# Patient Record
Sex: Male | Born: 1940 | Race: White | Hispanic: No | Smoking: Never smoker
Health system: Southern US, Community
[De-identification: ages and names within clinical notes are randomized; demographics above are authoritative.]

## PROBLEM LIST (undated history)

## (undated) DIAGNOSIS — R0602 Shortness of breath: Secondary | ICD-10-CM

## (undated) DIAGNOSIS — Z89119 Acquired absence of unspecified hand: Secondary | ICD-10-CM

## (undated) DIAGNOSIS — I10 Essential (primary) hypertension: Secondary | ICD-10-CM

## (undated) DIAGNOSIS — I6509 Occlusion and stenosis of unspecified vertebral artery: Secondary | ICD-10-CM

## (undated) DIAGNOSIS — I6523 Occlusion and stenosis of bilateral carotid arteries: Secondary | ICD-10-CM

## (undated) DIAGNOSIS — T7500XA Unspecified effects of lightning, initial encounter: Secondary | ICD-10-CM

## (undated) DIAGNOSIS — I350 Nonrheumatic aortic (valve) stenosis: Secondary | ICD-10-CM

## (undated) DIAGNOSIS — Z8709 Personal history of other diseases of the respiratory system: Secondary | ICD-10-CM

## (undated) DIAGNOSIS — M1990 Unspecified osteoarthritis, unspecified site: Secondary | ICD-10-CM

## (undated) DIAGNOSIS — Z87898 Personal history of other specified conditions: Secondary | ICD-10-CM

## (undated) DIAGNOSIS — I7121 Aneurysm of the ascending aorta, without rupture: Secondary | ICD-10-CM

## (undated) HISTORY — DX: Unspecified effects of lightning, initial encounter: T75.00XA

## (undated) HISTORY — DX: Personal history of other specified conditions: Z87.898

## (undated) HISTORY — DX: Occlusion and stenosis of unspecified vertebral artery: I65.09

## (undated) HISTORY — DX: Essential (primary) hypertension: I10

## (undated) HISTORY — DX: Nonrheumatic aortic (valve) stenosis: I35.0

## (undated) HISTORY — DX: Personal history of other diseases of the respiratory system: Z87.09

## (undated) HISTORY — DX: Aneurysm of the ascending aorta, without rupture: I71.21

## (undated) HISTORY — DX: Occlusion and stenosis of bilateral carotid arteries: I65.23

## (undated) HISTORY — DX: Shortness of breath: R06.02

---
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
## 1968-10-28 DIAGNOSIS — Z89119 Acquired absence of unspecified hand: Secondary | ICD-10-CM

## 1968-10-28 HISTORY — DX: Acquired absence of unspecified hand: Z89.119

## 1968-10-28 HISTORY — PX: FINGER SURGERY: SHX640

## 1979-02-26 DIAGNOSIS — T7500XA Unspecified effects of lightning, initial encounter: Secondary | ICD-10-CM

## 1979-02-26 HISTORY — DX: Unspecified effects of lightning, initial encounter: T75.00XA

## 2005-01-17 ENCOUNTER — Emergency Department (HOSPITAL_COMMUNITY): Admission: EM | Admit: 2005-01-17 | Discharge: 2005-01-17 | Payer: Self-pay | Admitting: Emergency Medicine

## 2005-01-18 ENCOUNTER — Emergency Department (HOSPITAL_COMMUNITY): Admission: EM | Admit: 2005-01-18 | Discharge: 2005-01-18 | Payer: Self-pay | Admitting: Emergency Medicine

## 2012-01-01 ENCOUNTER — Encounter: Payer: Self-pay | Admitting: *Deleted

## 2012-03-04 ENCOUNTER — Encounter: Payer: Self-pay | Admitting: Internal Medicine

## 2012-03-04 ENCOUNTER — Other Ambulatory Visit: Payer: Self-pay | Admitting: Internal Medicine

## 2012-03-04 ENCOUNTER — Ambulatory Visit (INDEPENDENT_AMBULATORY_CARE_PROVIDER_SITE_OTHER): Payer: Medicare Other | Admitting: Internal Medicine

## 2012-03-04 VITALS — BP 147/104 | HR 78 | Temp 97.9°F | Resp 16 | Ht 67.0 in | Wt 229.4 lb

## 2012-03-04 DIAGNOSIS — I10 Essential (primary) hypertension: Secondary | ICD-10-CM | POA: Insufficient documentation

## 2012-03-04 DIAGNOSIS — G8929 Other chronic pain: Secondary | ICD-10-CM

## 2012-03-04 DIAGNOSIS — M19079 Primary osteoarthritis, unspecified ankle and foot: Secondary | ICD-10-CM | POA: Insufficient documentation

## 2012-03-04 DIAGNOSIS — E1151 Type 2 diabetes mellitus with diabetic peripheral angiopathy without gangrene: Secondary | ICD-10-CM | POA: Insufficient documentation

## 2012-03-04 DIAGNOSIS — E785 Hyperlipidemia, unspecified: Secondary | ICD-10-CM | POA: Insufficient documentation

## 2012-03-04 DIAGNOSIS — Z89111 Acquired absence of right hand: Secondary | ICD-10-CM

## 2012-03-04 DIAGNOSIS — M25519 Pain in unspecified shoulder: Secondary | ICD-10-CM | POA: Insufficient documentation

## 2012-03-04 DIAGNOSIS — Z6835 Body mass index (BMI) 35.0-35.9, adult: Secondary | ICD-10-CM | POA: Insufficient documentation

## 2012-03-04 DIAGNOSIS — Z8719 Personal history of other diseases of the digestive system: Secondary | ICD-10-CM

## 2012-03-04 DIAGNOSIS — E119 Type 2 diabetes mellitus without complications: Secondary | ICD-10-CM | POA: Diagnosis not present

## 2012-03-04 LAB — LIPID PANEL
HDL: 37 mg/dL — ABNORMAL LOW (ref >39)
Triglycerides: 100 mg/dL (ref <150)

## 2012-03-04 LAB — POCT GLYCOSYLATED HEMOGLOBIN (HGB A1C): Hemoglobin A1C: 7.5

## 2012-03-04 LAB — CBC WITH DIFFERENTIAL/PLATELET
HCT: 44.5 % (ref 39.0–52.0)
Hemoglobin: 15 g/dL (ref 13.0–17.0)
Lymphocytes Relative: 32 % (ref 12–46)
MCHC: 33.7 g/dL (ref 30.0–36.0)
Monocytes Absolute: 0.6 10*3/uL (ref 0.1–1.0)
Monocytes Relative: 9 % (ref 3–12)
Neutro Abs: 3.7 10*3/uL (ref 1.7–7.7)
WBC: 6.5 10*3/uL (ref 4.0–10.5)

## 2012-03-04 LAB — COMPREHENSIVE METABOLIC PANEL
Albumin: 4.4 g/dL (ref 3.5–5.2)
BUN: 18 mg/dL (ref 6–23)
CO2: 24 mEq/L (ref 19–32)
Calcium: 9.5 mg/dL (ref 8.4–10.5)
Chloride: 104 mEq/L (ref 96–112)
Glucose, Bld: 103 mg/dL — ABNORMAL HIGH (ref 70–99)
Potassium: 4.2 mEq/L (ref 3.5–5.3)

## 2012-03-04 MED ORDER — GLIPIZIDE ER 10 MG PO TB24: 10.0000 mg | ORAL_TABLET | Freq: Every day | ORAL | Status: DC

## 2012-03-04 MED ORDER — METFORMIN HCL 1000 MG PO TABS: 1000.0000 mg | ORAL_TABLET | Freq: Two times a day (BID) | ORAL | Status: DC

## 2012-03-04 MED ORDER — VERAPAMIL HCL ER 360 MG PO CP24: 360.0000 mg | ORAL_CAPSULE | Freq: Every day | ORAL | Status: DC

## 2012-03-04 MED ORDER — BENAZEPRIL-HYDROCHLOROTHIAZIDE 20-12.5 MG PO TABS: 1.0000 | ORAL_TABLET | Freq: Every day | ORAL | Status: DC

## 2012-03-04 MED ORDER — HYDROCODONE-ACETAMINOPHEN 7.5-750 MG PO TABS: 1.0000 | ORAL_TABLET | Freq: Every day | ORAL | Status: AC

## 2012-03-04 NOTE — Progress Notes (Signed)
Subjective:    Patient ID: Kevin Weaver, male    DOB: 1941/01/01, 71 y.o.   MRN: [id number]  HPI Patient Active Problem List  Diagnoses  . HTN (hypertension)  . DM (diabetes mellitus)  . BMI 35.0-35.9,adult  . Hyperlipemia  . Amputee, hand, right  . Arthritis of foot  . Chronic shoulder pain  . History of GI bleed    Still working And without any increase in shortness of breath or decrease in energy although he claims he sure can't do what he used to do.  Review of Systems  Constitutional: Negative for activity change, appetite change and fatigue.       Has gained at least 10 pounds  Eyes: Negative for visual disturbance.  Respiratory: Negative for chest tightness.        He has no shortness of breath with activity than he would like but no real change in the last few years  Cardiovascular: Negative for chest pain, palpitations and leg swelling.  Gastrointestinal: Negative for abdominal pain and abdominal distention.  Genitourinary: Negative for frequency, hematuria and difficulty urinating.  Musculoskeletal:       He continues to the ankle and shoulder problems which limit his activity from time to time  Neurological: Negative for dizziness, speech difficulty and headaches.  Psychiatric/Behavioral: Negative for suicidal ideas, hallucinations, confusion, sleep disturbance, dysphoric mood and agitation. The patient is not nervous/anxious.        Objective:   Physical Exam  Constitutional: He is oriented to person, place, and time. He appears well-developed and well-nourished.       Overweight BMI 35  HENT:  Head: Normocephalic.  Eyes: Conjunctivae and EOM are normal. Pupils are equal, round, and reactive to light.  Neck: Neck supple. No thyromegaly present.  Cardiovascular: Normal rate, regular rhythm, normal heart sounds and intact distal pulses.  Exam reveals no gallop.   No murmur heard. Pulmonary/Chest: Breath sounds normal.  Neurological: He is alert and oriented  to person, place, and time.  Psychiatric: He has a normal mood and affect. His behavior is normal. Judgment and thought content normal.      Results for orders placed in visit on 03/04/12  POCT GLYCOSYLATED HEMOGLOBIN (HGB A1C)      Component Value Range   Hemoglobin A1C 7.5         Assessment & Plan:   1. DM (diabetes mellitus)  Comprehensive metabolic panel, POCT glycosylated hemoglobin (Hb A1C)  2. HTN (hypertension)  CBC with Differential, Comprehensive metabolic panel  3. BMI 35.0-35.9,adult    4. Hyperlipemia  Lipid panel  5. Amputee, hand, right    6. Arthritis of foot    7. Chronic shoulder pain             .     Recheck in 6 months/He elects to focus on dietary changes and weight loss to try to reduce his hemoglobin A1c to below 7.0           Meds ordered this encounter  Medications  . HYDROcodone-acetaminophen (VICODIN ES) 7.5-750 MG per tablet    Sig: Take 1 tablet by mouth at bedtime.    Dispense:  30 tablet    Refill:  5  . verapamil (VERELAN PM) 360 MG 24 hr capsule    Sig: Take 1 capsule (360 mg total) by mouth at bedtime.    Dispense:  90 capsule    Refill:  1  . metFORMIN (GLUCOPHAGE) 1000 MG tablet    Sig: Take  1 tablet (1,000 mg total) by mouth 2 (two) times daily with a meal.    Dispense:  180 tablet    Refill:  1  . glipiZIDE (GLUCOTROL XL) 10 MG 24 hr tablet    Sig: Take 1 tablet (10 mg total) by mouth daily.    Dispense:  90 tablet    Refill:  1  . benazepril-hydrochlorthiazide (LOTENSIN HCT) 20-12.5 MG per tablet    Sig: Take 1 tablet by mouth daily.    Dispense:  90 tablet    Refill:  1   Notify labs

## 2012-03-19 ENCOUNTER — Encounter: Payer: Self-pay | Admitting: Cardiology

## 2012-05-22 ENCOUNTER — Ambulatory Visit: Payer: Self-pay | Admitting: Internal Medicine

## 2012-05-22 VITALS — BP 138/100 | HR 68 | Temp 98.4°F | Resp 20 | Ht 67.0 in | Wt 232.4 lb

## 2012-05-22 DIAGNOSIS — Z0289 Encounter for other administrative examinations: Secondary | ICD-10-CM

## 2012-05-22 MED ORDER — HYDROCODONE-ACETAMINOPHEN 7.5-500 MG PO TABS: 1.0000 | ORAL_TABLET | Freq: Four times a day (QID) | ORAL | Status: DC | PRN

## 2012-05-22 NOTE — Progress Notes (Signed)
  Subjective:    Patient ID: Kevin Weaver, male    DOB: [birth year], 71 y.o.   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 HTN (hypertension)  . DM (diabetes mellitus)  . BMI 35.0-35.9,adult  . Hyperlipemia  . Amputee, hand, right  . Arthritis of foot  . Chronic shoulder pain  . History of GI bleed  Continues to be very stable Last followup May 2013 and labs were in good shape Home blood pressures are better than office blood pressures  No medicine compliance problemsReview of Systems He has no new symptoms and his review of systems    Objective:   Physical Exam  Constitutional: He is oriented to person, place, and time. He appears well-developed.       Overweight  HENT:       HEENT clear  Eyes: Conjunctivae and EOM are normal. Pupils are equal, round, and reactive to light.  Neck: Normal range of motion. Neck supple. No thyromegaly present.  Cardiovascular: Normal rate, regular rhythm and intact distal pulses.   Murmur heard.      1/6 systolic murmur at the outflow tract without radiation to the carotids Repeat blood pressure in the room sitting right arm 132/85  Pulmonary/Chest: Effort normal and breath sounds normal. He has no wheezes. He has no rales.  Abdominal: Soft. Bowel sounds are normal. He exhibits no distension and no mass. There is no tenderness. There is no guarding.       No abdominal bruit  Musculoskeletal: He exhibits no edema and no tenderness.  Neurological: He is alert and oriented to person, place, and time. He has normal reflexes. He displays normal reflexes. No cranial nerve deficit. He exhibits normal muscle tone. Coordination normal.  Skin: No rash noted.  Psychiatric: He has a normal mood and affect. His behavior is normal. Judgment and thought content normal.       Assessment & Plan:  Impression #1 self-pay DOT exam One-year certification due to hypertension Followup in  November for regular medical problems

## 2012-09-06 ENCOUNTER — Other Ambulatory Visit: Payer: Self-pay | Admitting: Physician Assistant

## 2012-09-09 ENCOUNTER — Ambulatory Visit: Payer: Medicare Other

## 2012-09-09 ENCOUNTER — Encounter: Payer: Self-pay | Admitting: Internal Medicine

## 2012-09-09 ENCOUNTER — Ambulatory Visit (INDEPENDENT_AMBULATORY_CARE_PROVIDER_SITE_OTHER): Payer: Medicare Other | Admitting: Internal Medicine

## 2012-09-09 VITALS — BP 140/108 | HR 63 | Temp 97.8°F | Resp 16 | Ht 67.0 in | Wt 227.8 lb

## 2012-09-09 DIAGNOSIS — Z125 Encounter for screening for malignant neoplasm of prostate: Secondary | ICD-10-CM | POA: Diagnosis not present

## 2012-09-09 DIAGNOSIS — E119 Type 2 diabetes mellitus without complications: Secondary | ICD-10-CM

## 2012-09-09 DIAGNOSIS — Z139 Encounter for screening, unspecified: Secondary | ICD-10-CM

## 2012-09-09 DIAGNOSIS — I10 Essential (primary) hypertension: Secondary | ICD-10-CM | POA: Diagnosis not present

## 2012-09-09 DIAGNOSIS — K219 Gastro-esophageal reflux disease without esophagitis: Secondary | ICD-10-CM | POA: Diagnosis not present

## 2012-09-09 DIAGNOSIS — E7800 Pure hypercholesterolemia, unspecified: Secondary | ICD-10-CM | POA: Diagnosis not present

## 2012-09-09 DIAGNOSIS — I712 Thoracic aortic aneurysm, without rupture: Secondary | ICD-10-CM

## 2012-09-09 DIAGNOSIS — IMO0001 Reserved for inherently not codable concepts without codable children: Secondary | ICD-10-CM

## 2012-09-09 DIAGNOSIS — Z136 Encounter for screening for cardiovascular disorders: Secondary | ICD-10-CM

## 2012-09-09 DIAGNOSIS — G8929 Other chronic pain: Secondary | ICD-10-CM

## 2012-09-09 LAB — COMPREHENSIVE METABOLIC PANEL
AST: 22 U/L (ref 0–37)
BUN: 20 mg/dL (ref 6–23)
Calcium: 9.8 mg/dL (ref 8.4–10.5)
Chloride: 104 mEq/L (ref 96–112)
Creat: 1.03 mg/dL (ref 0.50–1.35)
Total Bilirubin: 0.5 mg/dL (ref 0.3–1.2)

## 2012-09-09 LAB — LIPID PANEL
Cholesterol: 150 mg/dL (ref 0–200)
HDL: 38 mg/dL — ABNORMAL LOW (ref >39)
Triglycerides: 116 mg/dL (ref <150)
VLDL: 23 mg/dL (ref 0–40)

## 2012-09-09 MED ORDER — VERAPAMIL HCL ER 360 MG PO CP24: 360.0000 mg | ORAL_CAPSULE | Freq: Every day | ORAL | Status: DC

## 2012-09-09 MED ORDER — HYDROCODONE-ACETAMINOPHEN 7.5-500 MG PO TABS: 1.0000 | ORAL_TABLET | Freq: Four times a day (QID) | ORAL | Status: DC | PRN

## 2012-09-09 MED ORDER — METFORMIN HCL 1000 MG PO TABS: 1000.0000 mg | ORAL_TABLET | Freq: Two times a day (BID) | ORAL | Status: DC

## 2012-09-09 MED ORDER — GLIPIZIDE ER 10 MG PO TB24: 10.0000 mg | ORAL_TABLET | Freq: Every day | ORAL | Status: DC

## 2012-09-09 MED ORDER — OMEPRAZOLE 40 MG PO CPDR: 40.0000 mg | DELAYED_RELEASE_CAPSULE | Freq: Every day | ORAL | Status: DC

## 2012-09-09 MED ORDER — BENAZEPRIL-HYDROCHLOROTHIAZIDE 20-12.5 MG PO TABS: 2.0000 | ORAL_TABLET | Freq: Every day | ORAL | Status: DC

## 2012-09-09 NOTE — Addendum Note (Signed)
Addended by: Tonye Pearson on: 09/09/2012 11:43 PM   Modules accepted: Orders, Level of Service

## 2012-09-09 NOTE — Progress Notes (Addendum)
Subjective:    Patient ID: Kevin Weaver, male    DOB: [birth year], 71 y.o.   MRN: [id number]  HPISix-month followup Patient Active Problem List  Diagnosis  . HTN (hypertension)  . DM (diabetes mellitus)  . BMI 35.0-35.9,adult  . Hyperlipemia  . Amputee, hand, right  . Arthritis of foot  . Chronic shoulder pain  . 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 He denies NSAIDs. No melena. History of intermittent gastric reflux without treatment over the years. History of a GI bleed thought secondary to medication. This is bad enough at night that he regurgitates.    Review of Systems No new fatigue/no weight loss No vision changes or headaches No dysphagia/no change in appetite No new shortness of breath/no palpitations or chest pain, Although consideration should be given to his nocturnal gastrointestinal symptoms as possibly being related to the cardiothoracic area. No change in bowel movements No new urinary symptoms No numbness or burning extremities/no change in balance/No edema Has chronic pain/ is noted from his foot arthritis and old injury    Objective:   Physical Exam Vital signs-blood pressure 140/108 Weight 227 pounds Pulse ox 96 Pulse 63 Pupils equal round reactive to light and accommodation/extraocular movements conjugate No thyromegaly or lymphadenopathy Remainder of HEENT clear No carotid bruits Heart regular with a 1/6 systolic murmur at the outflow tract most prominently ( seen by Dr. Patty Sermons in 2010 without documented heart disease) Lungs clear Extremities= no edema/no sensory losses  Followup on chest x-ray from 2012 [facility] reading (PRIMARY) by  Dr.  Merla Riches. He makes the criteria for suspected thoracic aortic aneurysm with mild increase over the last year      Results for orders placed in visit on 09/09/12  POCT GLYCOSYLATED HEMOGLOBIN (HGB A1C)      Component Value Range   Hemoglobin A1C 7.4      Assessment & Plan:  Problem #1 suspected thoracic aneurysm He will need CT angiography with contrast at this time to r/o thoracic aortic aneurysm He needs  Ultrasound of the abdomen to rule out abdominal aneurysm based on his age and hx hypertension  Problem #2 hypertension-uncontrolled Lotensin will be increased to 2 tablets daily  Problem #3 recent onset of significant dyspepsia/GERD with nocturnal symptoms Check H. pylori Hemoccult at home Start omeprazole 40  Problem #4 diabetes Problem #5 hypertension Problem #6 hyperlipidemia Notify labs/ Check on eye exam and microalbumin at next visit  Meds ordered this encounter  Medications  . metFORMIN (GLUCOPHAGE) 1000 MG tablet    Sig: Take 1 tablet (1,000 mg total) by mouth 2 (two) times daily with a meal.    Dispense:  180 tablet    Refill:  1  . verapamil (VERELAN PM) 360 MG 24 hr capsule    Sig: Take 1 capsule (360 mg total) by mouth at bedtime.    Dispense:  90 capsule    Refill:  1  . HYDROcodone-acetaminophen (LORTAB) 7.5-500 MG per tablet    Sig: Take 1 tablet by mouth every 6 (six) hours as needed.    Dispense:  30 tablet    Refill:  5  . glipiZIDE (GLUCOTROL XL) 10 MG 24 hr tablet    Sig: Take  1 tablet (10 mg total) by mouth daily.    Dispense:  90 tablet    Refill:  1  . benazepril-hydrochlorthiazide (LOTENSIN HCT) 20-12.5 MG per tablet    Sig: Take 2 tablets by mouth daily.    Dispense:  180 tablet    Refill:  1  . omeprazole (PRILOSEC) 40 MG capsule    Sig: Take 1 capsule (40 mg total) by mouth daily.    Dispense:  90 capsule    Refill:  1   Routine health maintenance: Will send prescription for Zostavax  45 minute office visit

## 2012-09-10 LAB — CBC WITH DIFFERENTIAL/PLATELET
Basophils Relative: 1 % (ref 0–1)
HCT: 44.9 % (ref 39.0–52.0)
Hemoglobin: 15.8 g/dL (ref 13.0–17.0)
Lymphocytes Relative: 29 % (ref 12–46)
Lymphs Abs: 2.2 10*3/uL (ref 0.7–4.0)
MCHC: 35.2 g/dL (ref 30.0–36.0)
Monocytes Relative: 10 % (ref 3–12)
Neutro Abs: 4.5 10*3/uL (ref 1.7–7.7)
Neutrophils Relative %: 58 % (ref 43–77)
RBC: 5.05 MIL/uL (ref 4.22–5.81)
WBC: 7.6 10*3/uL (ref 4.0–10.5)

## 2012-09-10 LAB — H. PYLORI ANTIBODY, IGG: H Pylori IgG: 1.57 {ISR} — ABNORMAL HIGH

## 2012-09-10 LAB — PSA, MEDICARE: PSA: 1.09 ng/mL (ref <=4.00)

## 2012-09-14 ENCOUNTER — Encounter: Payer: Self-pay | Admitting: Internal Medicine

## 2012-09-25 ENCOUNTER — Telehealth: Payer: Self-pay | Admitting: Radiology

## 2012-09-25 NOTE — Telephone Encounter (Signed)
Looks like it is pending authorization, I will have Lupita Leash check on this. Kevin Weaver

## 2012-09-25 NOTE — Telephone Encounter (Signed)
Must be in our old system Thanks for trying Will you check to see if the CT of the chest to rule out thoracic aortic aneurysm was actually ordered. I tried to do this while he was here for his examination but I don't see that it was done. He has an exaggeration of his GERD symptoms which could coming from an aneurysm

## 2012-09-25 NOTE — Telephone Encounter (Signed)
Message copied by Caffie Damme on Fri Sep 25, 2012  9:59 AM ------      Message from: Tonye Pearson      Created: Mon Sep 14, 2012 11:20 AM       I sent the radiologist a note asking for a comment but have not received one.      Xray one year ago suggested thoracic aneurysm---if that concern still present on this xray he needs a ct which I have ordere      I would like to know if Radiology thinks a CT is justified before we spend the money      Can you have someone call about this to see how i can most easily get their opinion?

## 2012-09-25 NOTE — Telephone Encounter (Signed)
Called radiology. No response, called again to reading room ph [phone] the assistant now states there is not another chest xray they can view, spoke to [place]. Amy

## 2012-10-11 NOTE — Addendum Note (Signed)
Addended by: Tonye Pearson on: 10/11/2012 05:06 PM   Modules accepted: Orders

## 2012-12-29 ENCOUNTER — Telehealth: Payer: Self-pay | Admitting: Internal Medicine

## 2012-12-29 NOTE — Telephone Encounter (Signed)
None needed

## 2013-03-10 ENCOUNTER — Encounter: Payer: Self-pay | Admitting: Internal Medicine

## 2013-03-10 ENCOUNTER — Ambulatory Visit (INDEPENDENT_AMBULATORY_CARE_PROVIDER_SITE_OTHER): Payer: Medicare Other | Admitting: Emergency Medicine

## 2013-03-10 VITALS — BP 130/88 | HR 74 | Temp 97.7°F | Resp 16 | Ht 67.0 in | Wt 228.0 lb

## 2013-03-10 DIAGNOSIS — E119 Type 2 diabetes mellitus without complications: Secondary | ICD-10-CM | POA: Diagnosis not present

## 2013-03-10 DIAGNOSIS — I10 Essential (primary) hypertension: Secondary | ICD-10-CM | POA: Diagnosis not present

## 2013-03-10 DIAGNOSIS — K219 Gastro-esophageal reflux disease without esophagitis: Secondary | ICD-10-CM

## 2013-03-10 DIAGNOSIS — K921 Melena: Secondary | ICD-10-CM

## 2013-03-10 LAB — GLUCOSE, POCT (MANUAL RESULT ENTRY): POC Glucose: 265 mg/dl — AB (ref 70–99)

## 2013-03-10 LAB — POCT GLYCOSYLATED HEMOGLOBIN (HGB A1C): Hemoglobin A1C: 11.1

## 2013-03-10 MED ORDER — VERAPAMIL HCL ER 360 MG PO CP24: 360.0000 mg | ORAL_CAPSULE | Freq: Every day | ORAL | Status: DC

## 2013-03-10 MED ORDER — METFORMIN HCL 1000 MG PO TABS: 1000.0000 mg | ORAL_TABLET | Freq: Two times a day (BID) | ORAL | Status: DC

## 2013-03-10 MED ORDER — OMEPRAZOLE 40 MG PO CPDR: 40.0000 mg | DELAYED_RELEASE_CAPSULE | Freq: Every day | ORAL | Status: DC

## 2013-03-10 MED ORDER — GLIPIZIDE ER 10 MG PO TB24: 10.0000 mg | ORAL_TABLET | Freq: Every day | ORAL | Status: DC

## 2013-03-10 MED ORDER — BENAZEPRIL-HYDROCHLOROTHIAZIDE 20-12.5 MG PO TABS: 2.0000 | ORAL_TABLET | Freq: Every day | ORAL | Status: DC

## 2013-03-10 NOTE — Progress Notes (Signed)
  Subjective:    Patient ID: Kevin Weaver, male    DOB: February 07, 1941, 72 y.o.   MRN: [id number]  HPI the patient in the followup high blood pressure and diabetes. He states he is doing well is taking his medications regular. He has no complaints today of chest pain GI problems.   Review of Systems     Objective:   Physical Exam HEENT exam is unremarkable. Neck is supple. Chest is clear to auscultation and percussion. Heart rate no murmurs .   Results for orders placed in visit on 03/10/13  GLUCOSE, POCT (MANUAL RESULT ENTRY)      Result Value Range   POC Glucose 265 (*) 70 - 99 mg/dl  POCT GLYCOSYLATED HEMOGLOBIN (HGB A1C)      Result Value Range   Hemoglobin A1C 11.1         Assessment & Plan:  It turns out the patient has been out of his medicines for the last 3-4 months. His last hemoglobin A1c was 7.4 and today he is 11.1. All meds refilled recheck 3 months. Referral also made to GI specialist because he did have blood in his stool last check

## 2013-03-13 ENCOUNTER — Other Ambulatory Visit: Payer: Self-pay | Admitting: Internal Medicine

## 2013-06-16 ENCOUNTER — Ambulatory Visit: Payer: Medicare Other | Admitting: Internal Medicine

## 2013-06-30 ENCOUNTER — Ambulatory Visit (INDEPENDENT_AMBULATORY_CARE_PROVIDER_SITE_OTHER): Payer: Medicare Other | Admitting: Internal Medicine

## 2013-06-30 ENCOUNTER — Encounter: Payer: Self-pay | Admitting: Internal Medicine

## 2013-06-30 VITALS — BP 118/88 | HR 70 | Temp 98.0°F | Resp 16 | Ht 66.75 in | Wt 224.2 lb

## 2013-06-30 DIAGNOSIS — M25579 Pain in unspecified ankle and joints of unspecified foot: Secondary | ICD-10-CM

## 2013-06-30 DIAGNOSIS — K219 Gastro-esophageal reflux disease without esophagitis: Secondary | ICD-10-CM | POA: Diagnosis not present

## 2013-06-30 DIAGNOSIS — Z6835 Body mass index (BMI) 35.0-35.9, adult: Secondary | ICD-10-CM

## 2013-06-30 DIAGNOSIS — E119 Type 2 diabetes mellitus without complications: Secondary | ICD-10-CM

## 2013-06-30 DIAGNOSIS — E785 Hyperlipidemia, unspecified: Secondary | ICD-10-CM

## 2013-06-30 DIAGNOSIS — I10 Essential (primary) hypertension: Secondary | ICD-10-CM | POA: Diagnosis not present

## 2013-06-30 DIAGNOSIS — G8929 Other chronic pain: Secondary | ICD-10-CM

## 2013-06-30 LAB — CBC WITH DIFFERENTIAL/PLATELET
Eosinophils Relative: 2 % (ref 0–5)
HCT: 45.1 % (ref 39.0–52.0)
Hemoglobin: 15.3 g/dL (ref 13.0–17.0)
Lymphocytes Relative: 37 % (ref 12–46)
Lymphs Abs: 2.6 10*3/uL (ref 0.7–4.0)
MCV: 90.9 fL (ref 78.0–100.0)
Monocytes Absolute: 0.7 10*3/uL (ref 0.1–1.0)
Monocytes Relative: 9 % (ref 3–12)
Neutro Abs: 3.7 10*3/uL (ref 1.7–7.7)
WBC: 7.1 10*3/uL (ref 4.0–10.5)

## 2013-06-30 LAB — COMPREHENSIVE METABOLIC PANEL
ALT: 27 U/L (ref 0–53)
AST: 22 U/L (ref 0–37)
Alkaline Phosphatase: 66 U/L (ref 39–117)
CO2: 27 mEq/L (ref 19–32)
Creat: 1.22 mg/dL (ref 0.50–1.35)
Sodium: 140 mEq/L (ref 135–145)
Total Bilirubin: 0.9 mg/dL (ref 0.3–1.2)

## 2013-06-30 LAB — LIPID PANEL
HDL: 41 mg/dL (ref >39)
LDL Cholesterol: 98 mg/dL (ref 0–99)
Total CHOL/HDL Ratio: 4.1 Ratio
Triglycerides: 156 mg/dL — ABNORMAL HIGH (ref <150)
VLDL: 31 mg/dL (ref 0–40)

## 2013-06-30 MED ORDER — BENAZEPRIL-HYDROCHLOROTHIAZIDE 20-12.5 MG PO TABS: 2.0000 | ORAL_TABLET | Freq: Every day | ORAL | Status: DC

## 2013-06-30 MED ORDER — GLIPIZIDE ER 10 MG PO TB24: 10.0000 mg | ORAL_TABLET | Freq: Every day | ORAL | Status: DC

## 2013-06-30 MED ORDER — VERAPAMIL HCL ER 360 MG PO CP24: 360.0000 mg | ORAL_CAPSULE | Freq: Every day | ORAL | Status: DC

## 2013-06-30 MED ORDER — METFORMIN HCL 1000 MG PO TABS: 1000.0000 mg | ORAL_TABLET | Freq: Two times a day (BID) | ORAL | Status: DC

## 2013-06-30 MED ORDER — TRAMADOL HCL 50 MG PO TABS
ORAL_TABLET | ORAL | Status: DC
Start: 2013-06-30 — End: 2013-08-04

## 2013-06-30 MED ORDER — OMEPRAZOLE 40 MG PO CPDR: 40.0000 mg | DELAYED_RELEASE_CAPSULE | Freq: Every day | ORAL | Status: DC

## 2013-06-30 MED ORDER — MELOXICAM 15 MG PO TABS: 15.0000 mg | ORAL_TABLET | Freq: Every day | ORAL | Status: DC

## 2013-06-30 NOTE — Progress Notes (Addendum)
Subjective:    Patient ID: Kevin Weaver, male    DOB: 11/01/1940, 72 y.o.   MRN: [id number]  HPI  Patient presents today for follow up of diabetes and HTN. Has sporadically been checking blood pressure and glucose. Stomach symptoms gone, not requiring any meds, off opemprazole. Has not had any more blood in stool or dark stools. Is not interested in having a colonoscopy.  Has been awake since 2 am, driving from [facility].   Energy good lately, no chest pain, always had SOB, no worse. Does not stop him from doing what he needs to do.  Review of Systems  Constitutional: Negative for activity change and appetite change.  HENT: Negative for rhinorrhea and sneezing.   Respiratory: Positive for shortness of breath. Negative for wheezing.   Cardiovascular: Negative for chest pain and leg swelling.  Gastrointestinal: Negative for abdominal pain and blood in stool.  Allergic/Immunologic: Negative for environmental allergies.   Refuses flu vaccine.    Objective:   Physical Exam  Constitutional: He is oriented to person, place, and time. He appears well-developed and well-nourished.  Eyes: Conjunctivae are normal.  Neck: Normal range of motion. Neck supple.  Cardiovascular: Normal rate, regular rhythm, normal heart sounds and intact distal pulses.   Pulmonary/Chest: Effort normal and breath sounds normal. He has no wheezes.  Abdominal: Soft. Bowel sounds are normal.  Musculoskeletal: He exhibits no edema.  Right foot with long standing deformity.  Neurological: He is alert and oriented to person, place, and time.  Skin: Skin is warm and dry.  Toenails thick and yellow.  Psychiatric: He has a normal mood and affect.   BP 118/88  Pulse 70  Temp(Src) 98 F (36.7 C) (Oral)  Resp 16  Ht 5' 6.75" (1.695 m)  Wt 224 lb 3.2 oz (101.696 kg)  BMI 35.4 kg/m2  SpO2 95%        Assessment & Plan:  Diabetes - Plan: POCT glucose (manual entry), POCT glycosylated hemoglobin (Hb A1C),  CBC with Differential, Lipid panel, Comprehensive metabolic panel  DM (diabetes mellitus) - Plan: glipiZIDE (GLUCOTROL XL) 10 MG 24 hr tablet, metFORMIN (GLUCOPHAGE) 1000 MG tablet  HTN (hypertension) - Plan: benazepril-hydrochlorthiazide (LOTENSIN HCT) 20-12.5 MG per tablet, verapamil (VERELAN PM) 360 MG 24 hr capsule  GERD (gastroesophageal reflux disease) - Plan: omeprazole (PRILOSEC) 40 MG capsule  Chronic ankle pain, right - Plan: traMADol (ULTRAM) 50 MG tablet  Contin wt loss F/u 6 mos Meds ordered this encounter  Medications  . glipiZIDE (GLUCOTROL XL) 10 MG 24 hr tablet    Sig: Take 1 tablet (10 mg total) by mouth daily.    Dispense:  90 tablet    Refill:  1  . benazepril-hydrochlorthiazide (LOTENSIN HCT) 20-12.5 MG per tablet    Sig: Take 2 tablets by mouth daily.    Dispense:  180 tablet    Refill:  1  . metFORMIN (GLUCOPHAGE) 1000 MG tablet    Sig: Take 1 tablet (1,000 mg total) by mouth 2 (two) times daily with a meal.    Dispense:  180 tablet    Refill:  1  . verapamil (VERELAN PM) 360 MG 24 hr capsule    Sig: Take 1 capsule (360 mg total) by mouth at bedtime.    Dispense:  90 capsule    Refill:  1  . omeprazole (PRILOSEC) 40 MG capsule    Sig: Take 1 capsule (40 mg total) by mouth daily.    Dispense:  90 capsule  Refill:  1  . meloxicam (MOBIC) 15 MG tablet    Sig: Take 1 tablet (15 mg total) by mouth daily.    Dispense:  30 tablet    Refill:  5  . traMADol (ULTRAM) 50 MG tablet    Sig: 1-2 once a day for pain if needed    Dispense:  60 tablet    Refill:  5   partic in all aspects of case/I have reviewed and agree with documentation. Robert P. Merla Riches, M.D.

## 2013-06-30 NOTE — Patient Instructions (Signed)
Results for orders placed in visit on 06/30/13  GLUCOSE, POCT (MANUAL RESULT ENTRY)      Result Value Range   POC Glucose----not fasting 221 (*) 70 - 99 mg/dl  POCT GLYCOSYLATED HEMOGLOBIN (HGB A1C)      Result Value Range   Hemoglobin A1C 8.4     Hgb A1C coming back into control-acceptable for now. BP well controlled

## 2013-07-04 ENCOUNTER — Encounter: Payer: Self-pay | Admitting: Internal Medicine

## 2013-07-09 ENCOUNTER — Ambulatory Visit: Payer: Self-pay | Admitting: Family Medicine

## 2013-07-09 VITALS — BP 128/75 | HR 80 | Temp 97.9°F | Resp 18 | Wt 224.0 lb

## 2013-07-09 DIAGNOSIS — Z024 Encounter for examination for driving license: Secondary | ICD-10-CM

## 2013-07-09 DIAGNOSIS — IMO0001 Reserved for inherently not codable concepts without codable children: Secondary | ICD-10-CM

## 2013-07-09 DIAGNOSIS — Z0289 Encounter for other administrative examinations: Secondary | ICD-10-CM
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
## 2013-07-29 ENCOUNTER — Ambulatory Visit: Payer: Medicare Other

## 2013-07-29 ENCOUNTER — Ambulatory Visit (INDEPENDENT_AMBULATORY_CARE_PROVIDER_SITE_OTHER): Payer: Medicare Other | Admitting: Family Medicine

## 2013-07-29 ENCOUNTER — Other Ambulatory Visit (HOSPITAL_COMMUNITY): Payer: Self-pay | Admitting: Orthopaedic Surgery

## 2013-07-29 VITALS — BP 126/84 | HR 70 | Temp 97.4°F | Resp 18

## 2013-07-29 DIAGNOSIS — S82002A Unspecified fracture of left patella, initial encounter for closed fracture: Secondary | ICD-10-CM

## 2013-07-29 DIAGNOSIS — M79609 Pain in unspecified limb: Secondary | ICD-10-CM | POA: Diagnosis not present

## 2013-07-29 DIAGNOSIS — S82009A Unspecified fracture of unspecified patella, initial encounter for closed fracture: Secondary | ICD-10-CM

## 2013-07-29 DIAGNOSIS — IMO0002 Reserved for concepts with insufficient information to code with codable children: Secondary | ICD-10-CM | POA: Diagnosis not present
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
## 2013-07-30 ENCOUNTER — Other Ambulatory Visit (HOSPITAL_COMMUNITY): Payer: Self-pay | Admitting: Orthopaedic Surgery

## 2013-07-30 NOTE — Pre-Procedure Instructions (Signed)
MANDY FITZWATER  07/30/2013   Your procedure is scheduled on: Tuesday, October 7th   Report to [facility]  2 * 3 at 2:00 PM              (you will come through Entrance A which is right off of [facility].)   Call this number if you have problems the morning of surgery: [phone]   Remember:   Do not eat food or drink liquids after midnight Monday.   Take these medicines the morning of surgery with A SIP OF WATER: Lortab, Prilosec   Do not wear jewelry.  Do not wear lotions, powders, or colognes. You may NOT wear deodorant.   Men may shave face and neck.   Do not bring valuables to the hospital.  [facility] is not responsible for any belongings or valuables.               Contacts, dentures or bridgework may not be worn into surgery.  Leave suitcase in the car. After surgery it may be brought to your room.   For patients admitted to the hospital, discharge time is determined by your treatment team   Name and phone number of your driver:    Special Instructions: Shower using CHG 2 nights before surgery and the night before surgery.  If you shower the day of surgery use CHG.  Use special wash - you have one bottle of CHG for all showers.  You should use approximately 1/3 of the bottle for each shower.   Please read over the following fact sheets that you were given: Pain Booklet, Coughing and Deep Breathing and Surgical Site Infection Prevention

## 2013-08-02 ENCOUNTER — Encounter (HOSPITAL_COMMUNITY): Payer: Self-pay

## 2013-08-02 ENCOUNTER — Encounter (HOSPITAL_COMMUNITY)
Admission: RE | Admit: 2013-08-02 | Discharge: 2013-08-02 | Disposition: A | Discharge status: Home / Self Care | Payer: Medicare Other | Source: Ambulatory Visit | Attending: Orthopaedic Surgery | Admitting: Orthopaedic Surgery

## 2013-08-02 ENCOUNTER — Ambulatory Visit (HOSPITAL_COMMUNITY)
Admission: RE | Admit: 2013-08-02 | Discharge: 2013-08-02 | Disposition: A | Discharge status: Home / Self Care | Payer: Medicare Other | Source: Ambulatory Visit | Attending: Anesthesiology | Admitting: Anesthesiology

## 2013-08-02 DIAGNOSIS — Z79899 Other long term (current) drug therapy: Secondary | ICD-10-CM | POA: Diagnosis not present

## 2013-08-02 DIAGNOSIS — IMO0002 Reserved for concepts with insufficient information to code with codable children: Secondary | ICD-10-CM | POA: Diagnosis not present

## 2013-08-02 DIAGNOSIS — Z01810 Encounter for preprocedural cardiovascular examination: Secondary | ICD-10-CM | POA: Diagnosis not present

## 2013-08-02 DIAGNOSIS — Z01812 Encounter for preprocedural laboratory examination: Secondary | ICD-10-CM | POA: Diagnosis not present

## 2013-08-02 DIAGNOSIS — I10 Essential (primary) hypertension: Secondary | ICD-10-CM | POA: Diagnosis not present

## 2013-08-02 DIAGNOSIS — Z01818 Encounter for other preprocedural examination: Secondary | ICD-10-CM | POA: Diagnosis not present

## 2013-08-02 DIAGNOSIS — E119 Type 2 diabetes mellitus without complications: Secondary | ICD-10-CM | POA: Diagnosis not present

## 2013-08-02 HISTORY — DX: Unspecified osteoarthritis, unspecified site: M19.90

## 2013-08-02 HISTORY — DX: Acquired absence of unspecified hand: Z89.119

## 2013-08-02 LAB — COMPREHENSIVE METABOLIC PANEL
AST: 23 U/L (ref 0–37)
Albumin: 3.7 g/dL (ref 3.5–5.2)
Alkaline Phosphatase: 65 U/L (ref 39–117)
BUN: 19 mg/dL (ref 6–23)
Calcium: 9.3 mg/dL (ref 8.4–10.5)
Creatinine, Ser: 1.01 mg/dL (ref 0.50–1.35)
GFR calc Af Amer: 84 mL/min — ABNORMAL LOW (ref >90)
Glucose, Bld: 209 mg/dL — ABNORMAL HIGH (ref 70–99)
Potassium: 3.8 mEq/L (ref 3.5–5.1)
Sodium: 137 mEq/L (ref 135–145)
Total Bilirubin: 0.8 mg/dL (ref 0.3–1.2)
Total Protein: 7.1 g/dL (ref 6.0–8.3)

## 2013-08-02 LAB — CBC
HCT: 42.9 % (ref 39.0–52.0)
Hemoglobin: 15.2 g/dL (ref 13.0–17.0)
MCH: 32.5 pg (ref 26.0–34.0)
MCHC: 35.4 g/dL (ref 30.0–36.0)
MCV: 91.7 fL (ref 78.0–100.0)
RDW: 13.1 % (ref 11.5–15.5)

## 2013-08-02 MED ORDER — CEFAZOLIN SODIUM-DEXTROSE 2-3 GM-% IV SOLR
2.0000 g | INTRAVENOUS | Status: AC
Start: 2013-08-03 — End: 2013-08-03
  Administered 2013-08-03: 2 g via INTRAVENOUS

## 2013-08-02 NOTE — Progress Notes (Signed)
Patient went to see Dr. Patty Sermons in 2010 for a complaint of "chest pain"...he said it happened when he was pulling a track on a bobcat and heard a "pop"....went for check up and he has not had any more "cp", nor has he been back to see a cardiologist.   DA

## 2013-08-03 ENCOUNTER — Encounter (HOSPITAL_COMMUNITY): Payer: Self-pay | Admitting: Anesthesiology

## 2013-08-03 ENCOUNTER — Encounter (HOSPITAL_COMMUNITY)
Admission: RE | Disposition: A | Discharge status: Home / Self Care | Payer: Self-pay | Source: Ambulatory Visit | Attending: Orthopaedic Surgery

## 2013-08-03 ENCOUNTER — Encounter (HOSPITAL_COMMUNITY): Payer: Self-pay | Admitting: *Deleted

## 2013-08-03 ENCOUNTER — Observation Stay (HOSPITAL_COMMUNITY)
Admission: RE | Admit: 2013-08-03 | Discharge: 2013-08-04 | Disposition: A | Discharge status: Home / Self Care | Payer: Medicare Other | Source: Ambulatory Visit | Attending: Orthopaedic Surgery | Admitting: Orthopaedic Surgery

## 2013-08-03 ENCOUNTER — Ambulatory Visit (HOSPITAL_COMMUNITY): Payer: Medicare Other | Admitting: Anesthesiology

## 2013-08-03 DIAGNOSIS — G8918 Other acute postprocedural pain: Secondary | ICD-10-CM | POA: Diagnosis not present

## 2013-08-03 DIAGNOSIS — Z79899 Other long term (current) drug therapy: Secondary | ICD-10-CM | POA: Insufficient documentation

## 2013-08-03 DIAGNOSIS — Z01810 Encounter for preprocedural cardiovascular examination: Secondary | ICD-10-CM | POA: Insufficient documentation

## 2013-08-03 DIAGNOSIS — E119 Type 2 diabetes mellitus without complications: Secondary | ICD-10-CM | POA: Insufficient documentation

## 2013-08-03 DIAGNOSIS — W010XXA Fall on same level from slipping, tripping and stumbling without subsequent striking against object, initial encounter: Secondary | ICD-10-CM | POA: Insufficient documentation

## 2013-08-03 DIAGNOSIS — Z01818 Encounter for other preprocedural examination: Secondary | ICD-10-CM | POA: Insufficient documentation

## 2013-08-03 DIAGNOSIS — S76112A Strain of left quadriceps muscle, fascia and tendon, initial encounter: Secondary | ICD-10-CM

## 2013-08-03 DIAGNOSIS — M66259 Spontaneous rupture of extensor tendons, unspecified thigh: Secondary | ICD-10-CM | POA: Diagnosis not present

## 2013-08-03 DIAGNOSIS — IMO0002 Reserved for concepts with insufficient information to code with codable children: Secondary | ICD-10-CM | POA: Diagnosis not present

## 2013-08-03 DIAGNOSIS — Z01812 Encounter for preprocedural laboratory examination: Secondary | ICD-10-CM | POA: Diagnosis not present

## 2013-08-03 DIAGNOSIS — I10 Essential (primary) hypertension: Secondary | ICD-10-CM | POA: Insufficient documentation

## 2013-08-03 HISTORY — PX: QUADRICEPS TENDON REPAIR: SHX756

## 2013-08-03 LAB — GLUCOSE, CAPILLARY
Glucose-Capillary: 137 mg/dL — ABNORMAL HIGH (ref 70–99)
Glucose-Capillary: 111 mg/dL — ABNORMAL HIGH (ref 70–99)
Glucose-Capillary: 97 mg/dL (ref 70–99)

## 2013-08-03 SURGERY — REPAIR, TENDON, QUADRICEPS
Anesthesia: General | Site: Leg Upper | Laterality: Left | Wound class: Clean

## 2013-08-03 MED ORDER — CEFAZOLIN SODIUM 1-5 GM-% IV SOLN
1.0000 g | Freq: Four times a day (QID) | INTRAVENOUS | Status: AC
  Administered 2013-08-03 – 2013-08-04 (×3): 1 g via INTRAVENOUS
  Filled 2013-08-03 (×4): qty 50

## 2013-08-03 MED ORDER — ZOLPIDEM TARTRATE 5 MG PO TABS: 5.0000 mg | ORAL_TABLET | Freq: Every evening | ORAL | Status: DC | PRN

## 2013-08-03 MED ORDER — LIDOCAINE HCL (CARDIAC) 20 MG/ML IV SOLN
INTRAVENOUS | Status: DC | PRN
  Administered 2013-08-03: 80 mg via INTRAVENOUS

## 2013-08-03 MED ORDER — BENAZEPRIL-HYDROCHLOROTHIAZIDE 20-12.5 MG PO TABS: 2.0000 | ORAL_TABLET | Freq: Every day | ORAL | Status: DC

## 2013-08-03 MED ORDER — METOCLOPRAMIDE HCL 10 MG PO TABS: 5.0000 mg | ORAL_TABLET | Freq: Three times a day (TID) | ORAL | Status: DC | PRN

## 2013-08-03 MED ORDER — DEXTROSE 5 % IV SOLN
500.0000 mg | Freq: Four times a day (QID) | INTRAVENOUS | Status: DC | PRN
  Filled 2013-08-03: qty 5

## 2013-08-03 MED ORDER — PANTOPRAZOLE SODIUM 40 MG PO TBEC
80.0000 mg | DELAYED_RELEASE_TABLET | Freq: Every day | ORAL | Status: DC
  Administered 2013-08-04: 80 mg via ORAL
  Filled 2013-08-03: qty 2

## 2013-08-03 MED ORDER — GLIPIZIDE ER 10 MG PO TB24
10.0000 mg | ORAL_TABLET | Freq: Every day | ORAL | Status: DC
  Administered 2013-08-04: 10 mg via ORAL
  Filled 2013-08-03 (×2): qty 1

## 2013-08-03 MED ORDER — ONDANSETRON HCL 4 MG/2ML IJ SOLN: 4.0000 mg | Freq: Once | INTRAMUSCULAR | Status: AC | PRN

## 2013-08-03 MED ORDER — METOCLOPRAMIDE HCL 5 MG/ML IJ SOLN: 5.0000 mg | Freq: Three times a day (TID) | INTRAMUSCULAR | Status: DC | PRN

## 2013-08-03 MED ORDER — MORPHINE SULFATE 2 MG/ML IJ SOLN: 1.0000 mg | INTRAMUSCULAR | Status: DC | PRN

## 2013-08-03 MED ORDER — LACTATED RINGERS IV SOLN
INTRAVENOUS | Status: DC | PRN
  Administered 2013-08-03 (×2): via INTRAVENOUS

## 2013-08-03 MED ORDER — CEFAZOLIN SODIUM-DEXTROSE 2-3 GM-% IV SOLR
INTRAVENOUS | Status: AC
  Filled 2013-08-03: qty 50

## 2013-08-03 MED ORDER — VERAPAMIL HCL ER 180 MG PO TBCR
360.0000 mg | EXTENDED_RELEASE_TABLET | Freq: Every day | ORAL | Status: DC
  Administered 2013-08-04: 360 mg via ORAL
  Filled 2013-08-03: qty 2

## 2013-08-03 MED ORDER — ONDANSETRON HCL 4 MG/2ML IJ SOLN
INTRAMUSCULAR | Status: DC | PRN
  Administered 2013-08-03: 4 mg via INTRAMUSCULAR

## 2013-08-03 MED ORDER — BENAZEPRIL HCL 40 MG PO TABS
40.0000 mg | ORAL_TABLET | Freq: Every day | ORAL | Status: DC
  Administered 2013-08-04: 40 mg via ORAL
  Filled 2013-08-03: qty 1

## 2013-08-03 MED ORDER — FENTANYL CITRATE 0.05 MG/ML IJ SOLN
50.0000 ug | INTRAMUSCULAR | Status: DC | PRN
  Administered 2013-08-03: 100 ug via INTRAVENOUS

## 2013-08-03 MED ORDER — 0.9 % SODIUM CHLORIDE (POUR BTL) OPTIME
TOPICAL | Status: DC | PRN
  Administered 2013-08-03: 1000 mL

## 2013-08-03 MED ORDER — PROPOFOL 10 MG/ML IV BOLUS
INTRAVENOUS | Status: DC | PRN
  Administered 2013-08-03: 200 mg via INTRAVENOUS

## 2013-08-03 MED ORDER — SODIUM CHLORIDE 0.9 % IV SOLN
INTRAVENOUS | Status: DC
  Administered 2013-08-03: 23:00:00 via INTRAVENOUS

## 2013-08-03 MED ORDER — FENTANYL CITRATE 0.05 MG/ML IJ SOLN
INTRAMUSCULAR | Status: AC
  Administered 2013-08-03: 100 ug via INTRAVENOUS
  Filled 2013-08-03: qty 2

## 2013-08-03 MED ORDER — METFORMIN HCL 500 MG PO TABS: 1000.0000 mg | ORAL_TABLET | Freq: Two times a day (BID) | ORAL | Status: DC

## 2013-08-03 MED ORDER — LACTATED RINGERS IV SOLN
INTRAVENOUS | Status: DC
  Administered 2013-08-03: 15:00:00 via INTRAVENOUS

## 2013-08-03 MED ORDER — EPHEDRINE SULFATE 50 MG/ML IJ SOLN
INTRAMUSCULAR | Status: DC | PRN
  Administered 2013-08-03 (×3): 10 mg via INTRAVENOUS

## 2013-08-03 MED ORDER — GLYCOPYRROLATE 0.2 MG/ML IJ SOLN
INTRAMUSCULAR | Status: DC | PRN
  Administered 2013-08-03: 0.2 mg via INTRAVENOUS

## 2013-08-03 MED ORDER — METHOCARBAMOL 500 MG PO TABS: 500.0000 mg | ORAL_TABLET | Freq: Four times a day (QID) | ORAL | Status: DC | PRN

## 2013-08-03 MED ORDER — HYDROCODONE-ACETAMINOPHEN 5-325 MG PO TABS: 1.0000 | ORAL_TABLET | ORAL | Status: DC | PRN

## 2013-08-03 MED ORDER — OXYCODONE HCL 5 MG PO TABS: 5.0000 mg | ORAL_TABLET | ORAL | Status: DC | PRN

## 2013-08-03 MED ORDER — HYDROMORPHONE HCL PF 1 MG/ML IJ SOLN: 0.2500 mg | INTRAMUSCULAR | Status: DC | PRN

## 2013-08-03 MED ORDER — ONDANSETRON HCL 4 MG PO TABS: 4.0000 mg | ORAL_TABLET | Freq: Four times a day (QID) | ORAL | Status: DC | PRN

## 2013-08-03 MED ORDER — ONDANSETRON HCL 4 MG/2ML IJ SOLN: 4.0000 mg | Freq: Four times a day (QID) | INTRAMUSCULAR | Status: DC | PRN

## 2013-08-03 MED ORDER — HYDROCHLOROTHIAZIDE 25 MG PO TABS
25.0000 mg | ORAL_TABLET | Freq: Every day | ORAL | Status: DC
  Administered 2013-08-04: 25 mg via ORAL
  Filled 2013-08-03: qty 1

## 2013-08-03 MED ORDER — DIPHENHYDRAMINE HCL 12.5 MG/5ML PO ELIX: 12.5000 mg | ORAL_SOLUTION | ORAL | Status: DC | PRN

## 2013-08-03 MED ORDER — VERAPAMIL HCL ER 360 MG PO CP24: 360.0000 mg | ORAL_CAPSULE | Freq: Every morning | ORAL | Status: DC

## 2013-08-03 MED ORDER — METFORMIN HCL 500 MG PO TABS
1000.0000 mg | ORAL_TABLET | Freq: Two times a day (BID) | ORAL | Status: DC
  Administered 2013-08-04: 1000 mg via ORAL
  Filled 2013-08-03 (×3): qty 2

## 2013-08-03 MED ORDER — ASPIRIN 325 MG PO TABS
325.0000 mg | ORAL_TABLET | Freq: Two times a day (BID) | ORAL | Status: DC
  Administered 2013-08-04: 325 mg via ORAL
  Filled 2013-08-03 (×3): qty 1

## 2013-08-03 SURGICAL SUPPLY — 51 items
BANDAGE ELASTIC 4 VELCRO ST LF (GAUZE/BANDAGES/DRESSINGS) ×1 IMPLANT
BANDAGE ELASTIC 6 VELCRO ST LF (GAUZE/BANDAGES/DRESSINGS) ×1 IMPLANT
BANDAGE ESMARK 6X9 LF (GAUZE/BANDAGES/DRESSINGS) IMPLANT
BANDAGE GAUZE ELAST BULKY 4 IN (GAUZE/BANDAGES/DRESSINGS) ×3 IMPLANT
BNDG CMPR 9X6 STRL LF SNTH (GAUZE/BANDAGES/DRESSINGS) ×1
BNDG ESMARK 6X9 LF (GAUZE/BANDAGES/DRESSINGS) ×2
CLOTH BEACON ORANGE TIMEOUT ST (SAFETY) ×1 IMPLANT
CUFF TOURNIQUET SINGLE 34IN LL (TOURNIQUET CUFF) ×2 IMPLANT
CUFF TOURNIQUET SINGLE 44IN (TOURNIQUET CUFF) IMPLANT
DRAPE U-SHAPE 47X51 STRL (DRAPES) ×2 IMPLANT
DRSG EMULSION OIL 3X3 NADH (GAUZE/BANDAGES/DRESSINGS) ×1 IMPLANT
DRSG PAD ABDOMINAL 8X10 ST (GAUZE/BANDAGES/DRESSINGS) ×2 IMPLANT
ELECT REM PT RETURN 9FT ADLT (ELECTROSURGICAL) ×2
ELECTRODE REM PT RTRN 9FT ADLT (ELECTROSURGICAL) ×1 IMPLANT
GAUZE XEROFORM 1X8 LF (GAUZE/BANDAGES/DRESSINGS) ×1 IMPLANT
GLOVE BIO SURGEON STRL SZ8 (GLOVE) ×2 IMPLANT
GLOVE BIOGEL PI IND STRL 7.5 (GLOVE) ×1 IMPLANT
GLOVE BIOGEL PI IND STRL 8 (GLOVE) ×1 IMPLANT
GLOVE BIOGEL PI INDICATOR 7.5 (GLOVE) ×1
GLOVE BIOGEL PI INDICATOR 8 (GLOVE) ×1
GLOVE ORTHO TXT STRL SZ7.5 (GLOVE) ×2 IMPLANT
GLOVE SURG ORTHO 8.0 STRL STRW (GLOVE) ×2 IMPLANT
GOWN PREVENTION PLUS LG XLONG (DISPOSABLE) ×1 IMPLANT
GOWN PREVENTION PLUS XLARGE (GOWN DISPOSABLE) ×2 IMPLANT
GOWN STRL NON-REIN LRG LVL3 (GOWN DISPOSABLE) ×4 IMPLANT
KIT BASIN OR (CUSTOM PROCEDURE TRAY) ×2 IMPLANT
KIT ROOM TURNOVER OR (KITS) ×2 IMPLANT
MANIFOLD NEPTUNE II (INSTRUMENTS) ×1 IMPLANT
NS IRRIG 1000ML POUR BTL (IV SOLUTION) ×2 IMPLANT
PACK ORTHO EXTREMITY (CUSTOM PROCEDURE TRAY) ×2 IMPLANT
PAD ARMBOARD 7.5X6 YLW CONV (MISCELLANEOUS) ×4 IMPLANT
PADDING CAST COTTON 6X4 STRL (CAST SUPPLIES) ×1 IMPLANT
SPONGE GAUZE 4X4 12PLY (GAUZE/BANDAGES/DRESSINGS) ×2 IMPLANT
SPONGE LAP 18X18 X RAY DECT (DISPOSABLE) ×2 IMPLANT
SPONGE LAP 4X18 X RAY DECT (DISPOSABLE) ×1 IMPLANT
STAPLER VISISTAT 35W (STAPLE) ×1 IMPLANT
STOCKINETTE IMPERVIOUS 9X36 MD (GAUZE/BANDAGES/DRESSINGS) ×2 IMPLANT
SUT FIBERWIRE #2 38 T-5 BLUE (SUTURE) ×4
SUT VIC AB 0 CT1 27 (SUTURE) ×2
SUT VIC AB 0 CT1 27XBRD ANBCTR (SUTURE) ×1 IMPLANT
SUT VIC AB 1 CT1 27 (SUTURE) ×4
SUT VIC AB 1 CT1 27XBRD ANBCTR (SUTURE) IMPLANT
SUT VIC AB 2-0 CT1 27 (SUTURE) ×2
SUT VIC AB 2-0 CT1 27XBRD (SUTURE) ×1 IMPLANT
SUTURE FIBERWR #2 38 T-5 BLUE (SUTURE) ×1 IMPLANT
TOWEL OR 17X24 6PK STRL BLUE (TOWEL DISPOSABLE) ×2 IMPLANT
TOWEL OR 17X26 10 PK STRL BLUE (TOWEL DISPOSABLE) ×2 IMPLANT
TUBE CONNECTING 12X1/4 (SUCTIONS) ×2 IMPLANT
UNDERPAD 30X30 INCONTINENT (UNDERPADS AND DIAPERS) ×2 IMPLANT
WATER STERILE IRR 1000ML POUR (IV SOLUTION) ×2 IMPLANT
YANKAUER SUCT BULB TIP NO VENT (SUCTIONS) ×2 IMPLANT

## 2013-08-03 NOTE — Anesthesia Postprocedure Evaluation (Signed)
  Anesthesia Post-op Note  Patient: Kevin Weaver  Procedure(s) Performed: Procedure(s): DIRECT PRIMARY REPAIR LEFT QUADRICEPS TENDON (Left)  Patient Location: PACU  Anesthesia Type:General  Level of Consciousness: awake and alert   Airway and Oxygen Therapy: Patient Spontanous Breathing  Post-op Pain: mild  Post-op Assessment: Post-op Vital signs reviewed  Post-op Vital Signs: stable  Complications: No apparent anesthesia complications

## 2013-08-03 NOTE — Transfer of Care (Signed)
Immediate Anesthesia Transfer of Care Note  Patient: Kevin Weaver  Procedure(s) Performed: Procedure(s): DIRECT PRIMARY REPAIR LEFT QUADRICEPS TENDON (Left)  Patient Location: PACU  Anesthesia Type:General and Regional  Level of Consciousness: awake, alert , oriented, patient cooperative and responds to stimulation  Airway & Oxygen Therapy: Patient Spontanous Breathing and Patient connected to nasal cannula oxygen  Post-op Assessment: Report given to PACU RN, Post -op Vital signs reviewed and stable and Patient moving all extremities X 4  Post vital signs: Reviewed and stable  Complications: No apparent anesthesia complications

## 2013-08-03 NOTE — Anesthesia Procedure Notes (Signed)
Anesthesia Regional Block:  Femoral nerve block  Pre-Anesthetic Checklist: ,, timeout performed, Correct Patient, Correct Site, Correct Laterality, Correct Procedure, Correct Position, site marked, Risks and benefits discussed,  Surgical consent,  Pre-op evaluation,  At surgeon's request and post-op pain management  Laterality: Left  Prep: chloraprep and alcohol swabs       Needles:  Injection technique: Single-shot  Needle Type: Stimulator Needle - 80          Additional Needles:  Procedures: nerve stimulator Femoral nerve block  Nerve Stimulator or Paresthesia:  Response: 0.5 mA, 0.1 ms, 5 cm  Additional Responses:   Narrative:  Start time: 08/03/2013 3:35 PM End time: 08/03/2013 3:40 PM Injection made incrementally with aspirations every 5 mL.  Performed by: Personally  Anesthesiologist: Maren Beach MD  Additional Notes: Pt accepts procedure and risks. 20cc 0.5% Marcaine w/epi w/o difficulty or discomfort. GES

## 2013-08-03 NOTE — Preoperative (Signed)
Beta Blockers   Reason not to administer Beta Blockers:Not Applicable 
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
## 2013-08-03 NOTE — Anesthesia Preprocedure Evaluation (Signed)
Anesthesia Evaluation  Patient identified by MRN, date of birth, ID band Patient awake    Reviewed: Allergy & Precautions, H&P , Patient's Chart, lab work & pertinent test results  Airway       Dental   Pulmonary          Cardiovascular hypertension,     Neuro/Psych    GI/Hepatic   Endo/Other  diabetes, Type 2, Oral Hypoglycemic Agents  Renal/GU      Musculoskeletal   Abdominal   Peds  Hematology   Anesthesia Other Findings   Reproductive/Obstetrics                           Anesthesia Physical Anesthesia Plan  ASA: III  Anesthesia Plan: General   Post-op Pain Management:    Induction: Intravenous  Airway Management Planned: LMA  Additional Equipment:   Intra-op Plan:   Post-operative Plan: Extubation in OR  Informed Consent: I have reviewed the patients History and Physical, chart, labs and discussed the procedure including the risks, benefits and alternatives for the proposed anesthesia with the patient or authorized representative who has indicated his/her understanding and acceptance.     Plan Discussed with: CRNA, Anesthesiologist and Surgeon  Anesthesia Plan Comments:         Anesthesia Quick Evaluation

## 2013-08-03 NOTE — Brief Op Note (Signed)
08/03/2013  6:25 PM  PATIENT:  Kevin Weaver  72 y.o. male  PRE-OPERATIVE DIAGNOSIS:  Left quadriceps tendon rupture  POST-OPERATIVE DIAGNOSIS:  Left quadriceps tendon rupture  PROCEDURE:  Procedure(s): DIRECT PRIMARY REPAIR LEFT QUADRICEPS TENDON (Left)  SURGEON:  Surgeon(s) and Role:    * Kathryne Hitch, MD - Primary  PHYSICIAN ASSISTANT: Rexene Edison, PA-C  ANESTHESIA:   regional and general  EBL:  Total I/O In: 1000 [I.V.:1000] Out: -   BLOOD ADMINISTERED:none  DRAINS: none   LOCAL MEDICATIONS USED:  NONE  SPECIMEN:  No Specimen  DISPOSITION OF SPECIMEN:  N/A  COUNTS:  YES  TOURNIQUET:  * Missing tourniquet times found for documented tourniquets in log:  [id number] *  DICTATION: .Other Dictation: Dictation Number [id number]  PLAN OF CARE: Admit for overnight observation  PATIENT DISPOSITION:  PACU - hemodynamically stable.   Delay start of Pharmacological VTE agent (>24hrs) due to surgical blood loss or risk of bleeding: no

## 2013-08-04 LAB — GLUCOSE, CAPILLARY
Glucose-Capillary: 168 mg/dL — ABNORMAL HIGH (ref 70–99)
Glucose-Capillary: 260 mg/dL — ABNORMAL HIGH (ref 70–99)

## 2013-08-04 MED ORDER — ASPIRIN 325 MG PO TABS: 325.0000 mg | ORAL_TABLET | Freq: Two times a day (BID) | ORAL | Status: DC

## 2013-08-04 MED ORDER — OXYCODONE-ACETAMINOPHEN 5-325 MG PO TABS: 1.0000 | ORAL_TABLET | Freq: Four times a day (QID) | ORAL | Status: DC | PRN

## 2013-08-04 NOTE — Progress Notes (Signed)
Subjective: 1 Day Post-Op Procedure(s) (LRB): DIRECT PRIMARY REPAIR LEFT QUADRICEPS TENDON (Left) Patient reports pain as mild.    Objective: Vital signs in last 24 hours: Temp:  [97.1 F (36.2 C)-98.7 F (37.1 C)] 98.7 F (37.1 C) (10/08 0654) Pulse Rate:  [54-70] 56 (10/08 0654) Resp:  [12-20] 18 (10/08 0654) BP: (102-136)/(71-88) 126/75 mmHg (10/08 0654) SpO2:  [92 %-100 %] 96 % (10/08 0654) Weight:  [101.606 kg (224 lb)] 101.606 kg (224 lb) (10/08 0500)  Intake/Output from previous day: 10/07 0701 - 10/08 0700 In: 1835.2 [P.O.:740; I.V.:1045.2; IV Piggyback:50] Out: 1850 [Urine:1850] Intake/Output this shift:     Recent Labs  08/02/13 1235  HGB 15.2    Recent Labs  08/02/13 1235  WBC 8.4  RBC 4.68  HCT 42.9  PLT 222    Recent Labs  08/02/13 1235  NA 137  K 3.8  CL 99  CO2 24  BUN 19  CREATININE 1.01  GLUCOSE 209*  CALCIUM 9.3   No results found for this basename: LABPT, INR,  in the last 72 hours  Sensation intact distally Intact pulses distally Dorsiflexion/Plantar flexion intact Incision: dressing C/D/I Compartment soft  Assessment/Plan: 1 Day Post-Op Procedure(s) (LRB): DIRECT PRIMARY REPAIR LEFT QUADRICEPS TENDON (Left) Can discharge to home today after gets Bledsoe knee brace.  Rohin Krejci Y 08/04/2013, 7:21 AM
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
## 2013-08-04 NOTE — Op Note (Signed)
NAME:  Kevin Weaver, Kevin Weaver NO.:  [id number]  MEDICAL RECORD NO.:  [id number]  LOCATION:  5N30C                        FACILITY:  [facility]  PHYSICIAN:  Vanita Panda. Magnus Ivan, M.D.DATE OF BIRTH:  05-May-1941  DATE OF PROCEDURE:  08/03/2013 DATE OF DISCHARGE:                              OPERATIVE REPORT   PREOPERATIVE DIAGNOSIS:  Left knee acute quadriceps tendon rupture.  POSTOPERATIVE DIAGNOSIS:  Left knee acute quadriceps tendon rupture.  PROCEDURE:  Direct primary repair of left knee quadriceps tendon.  SURGEON:  Doneen Poisson, MD.  ASSISTANT:  Richardean Canal, P.A.C.  ANESTHESIA: 1. Left leg femoral nerve block. 2. General.  TOURNIQUET TIME:  Less than 1 hour.  BLOOD LOSS:  Minimal.  COMPLICATIONS:  None.  INDICATIONS:  Kevin Weaver is a very pleasant 72 year old former who was cleaning stuck window last week when he slipped and fell, and sustained an injury to his left knee.  We saw him in my office that day and he was found to have quadriceps tendon rupture and he could not extend his knee and has deficit between the patella and his quad tendon.  We recommended he undergo direct primary repair.  He understands this fully and does wish to proceed with surgery.  PROCEDURE DESCRIPTION:  After informed consent was obtained, appropriate left knee was marked.  He was brought to the operating room, placed in supine on operating table.  Of note, in the holding room, anesthesia did obtain a left leg femoral nerve block.  General anesthesia was then obtained.  He was on the operating table.  A nonsterile tourniquet was placed around his upper left thigh.  His left leg was prepped and draped with DuraPrep and sterile drapes.  Time-out was called to identify correct patient, correct left knee.  We then used an Esmarch wrap out the leg and tourniquet inflated to 300 mm of pressure.  We then made an incision directly over the patella and carried this proximally  distally. I dissected down to the knee joint and found a deficit between the quadriceps tendon and the patella and there was a complete rupture including the retinaculum of the knee and the vastus medialis obliques muscle, vastus medialis obliques muscle.  We copiously irrigated the knee with normal saline solution and cleaned the joint of an effusion. We then freshened up the end of the quadriceps tendon was ruptured and used a #2 FiberWire suture with as a Krackow suture bringing 4 limbs suture out from the quad tendon.  We then placed 3 guide pins through the patella and overdrilled these with a 3.2 cannulated drill.  Using a suture passer, then we were able to bring all limbs of the sutures through the 3 drill holes to tie medially and laterally the sutures over a bony bridge.  Once we were able to do this, we oversewed the retinaculum and the quadriceps tendon with #1 Vicryl suture.  We then irrigated the tissues again and closed the deep tissue with 0 Vicryl followed by 2-0 Vicryl in subcutaneous tissue, and staples on the skin. Xeroform, a well-padded sterile dressing was applied.  The knee was placed in a knee immobilizer.  He was awakened, extubated,  and taken to recovery room in stable condition.  All final counts were correct. There were no complications noted.  Of note, Richardean Canal, PAC was present in the entire case.  His presence was crucial in patient positioning, retracting during the repair, and closure of the wound.     Vanita Panda. Magnus Ivan, M.D.     CYB/MEDQ  D:  08/03/2013  T:  08/04/2013  Job:  [id number]

## 2013-08-06 ENCOUNTER — Encounter (HOSPITAL_COMMUNITY): Payer: Self-pay | Admitting: Orthopaedic Surgery

## 2013-08-09 NOTE — Addendum Note (Signed)
Addendum created 08/09/13 1353 by Rivka Barbara, MD   Modules edited: Anesthesia Attestations

## 2013-08-31 IMAGING — CR DG CHEST 2V
2 series · 2 of 2 positions shown · non-contrast
Comparison: None

***ADDENDUM*** CREATED: 10/07/2012 [DATE]

The thoracic aorta appears tortuous and unfolded. The transverse
aortic arch and descending thoracic aorta appears ectatic cannot
rule out aneurysm. No previous films available for side by side
comparison.  If there is a history of aortic aneurysm then follow-
up imaging with CT of the chest would be indicated.
CLINICAL DATA: Screening
CHEST - 2 VIEW

[lateral]
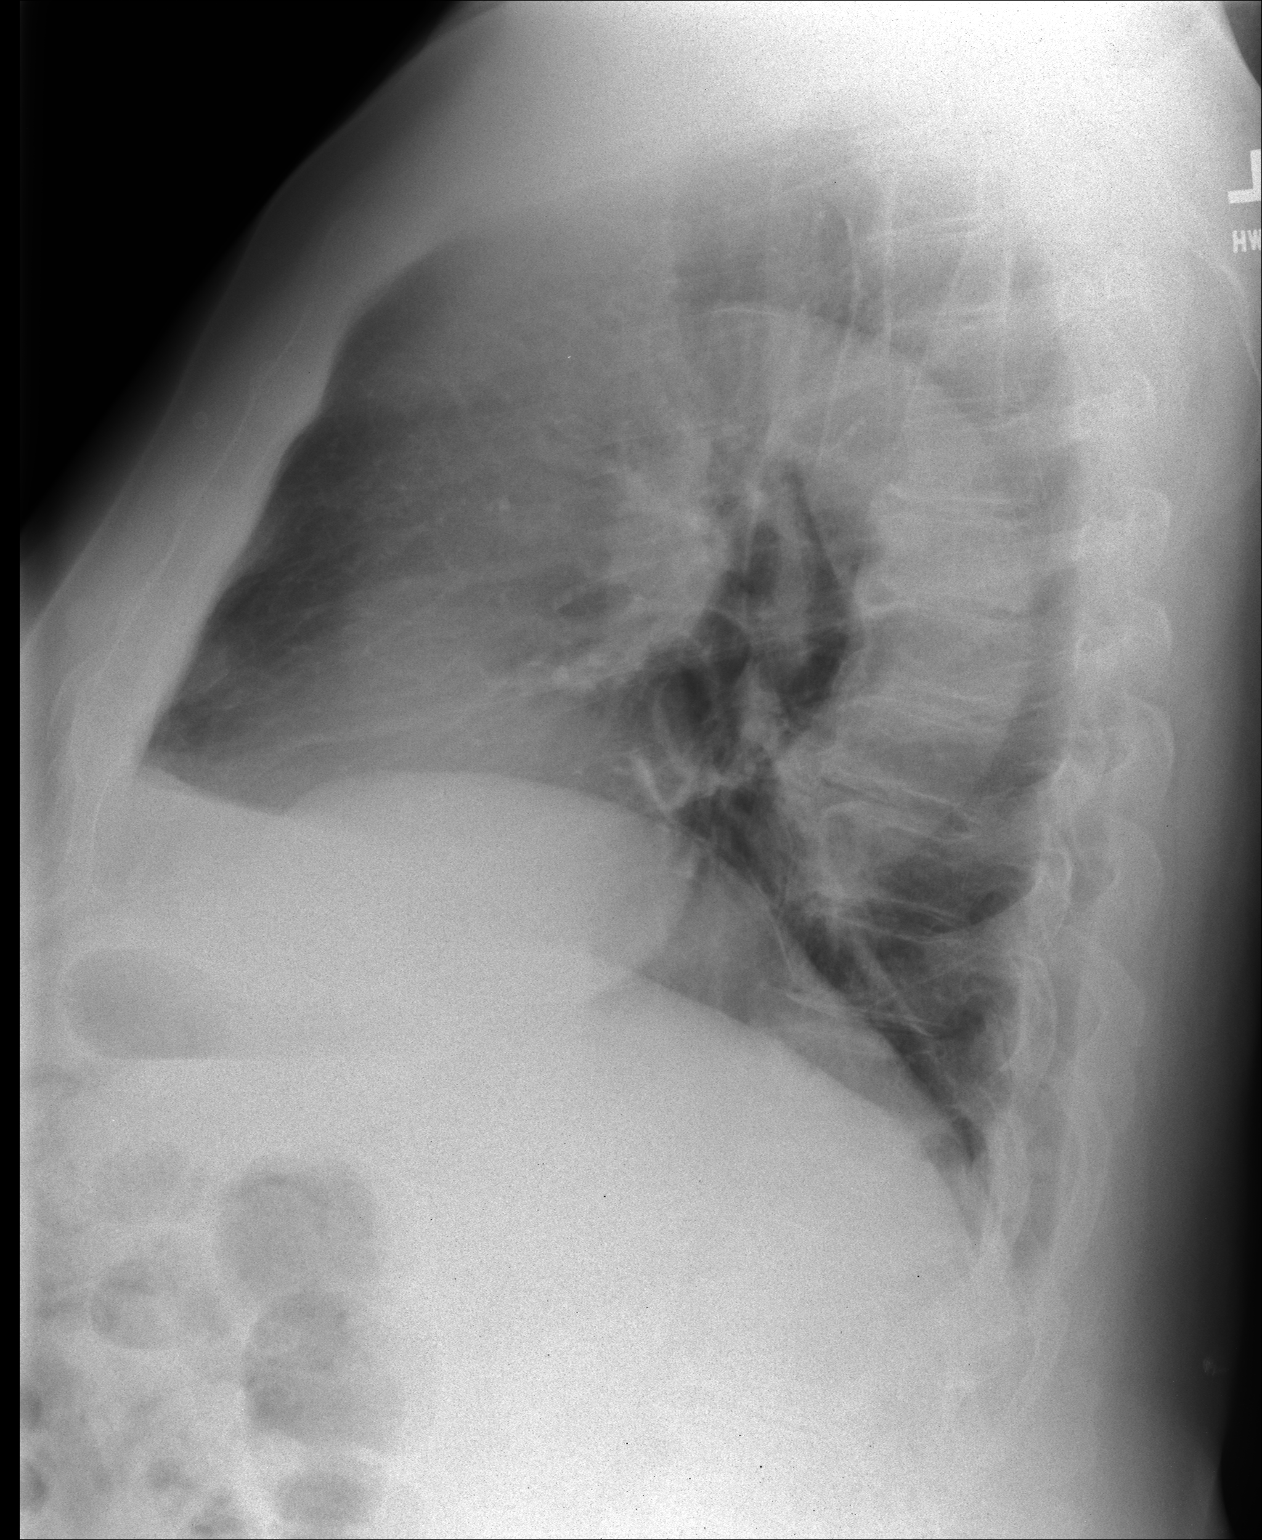

[PA]
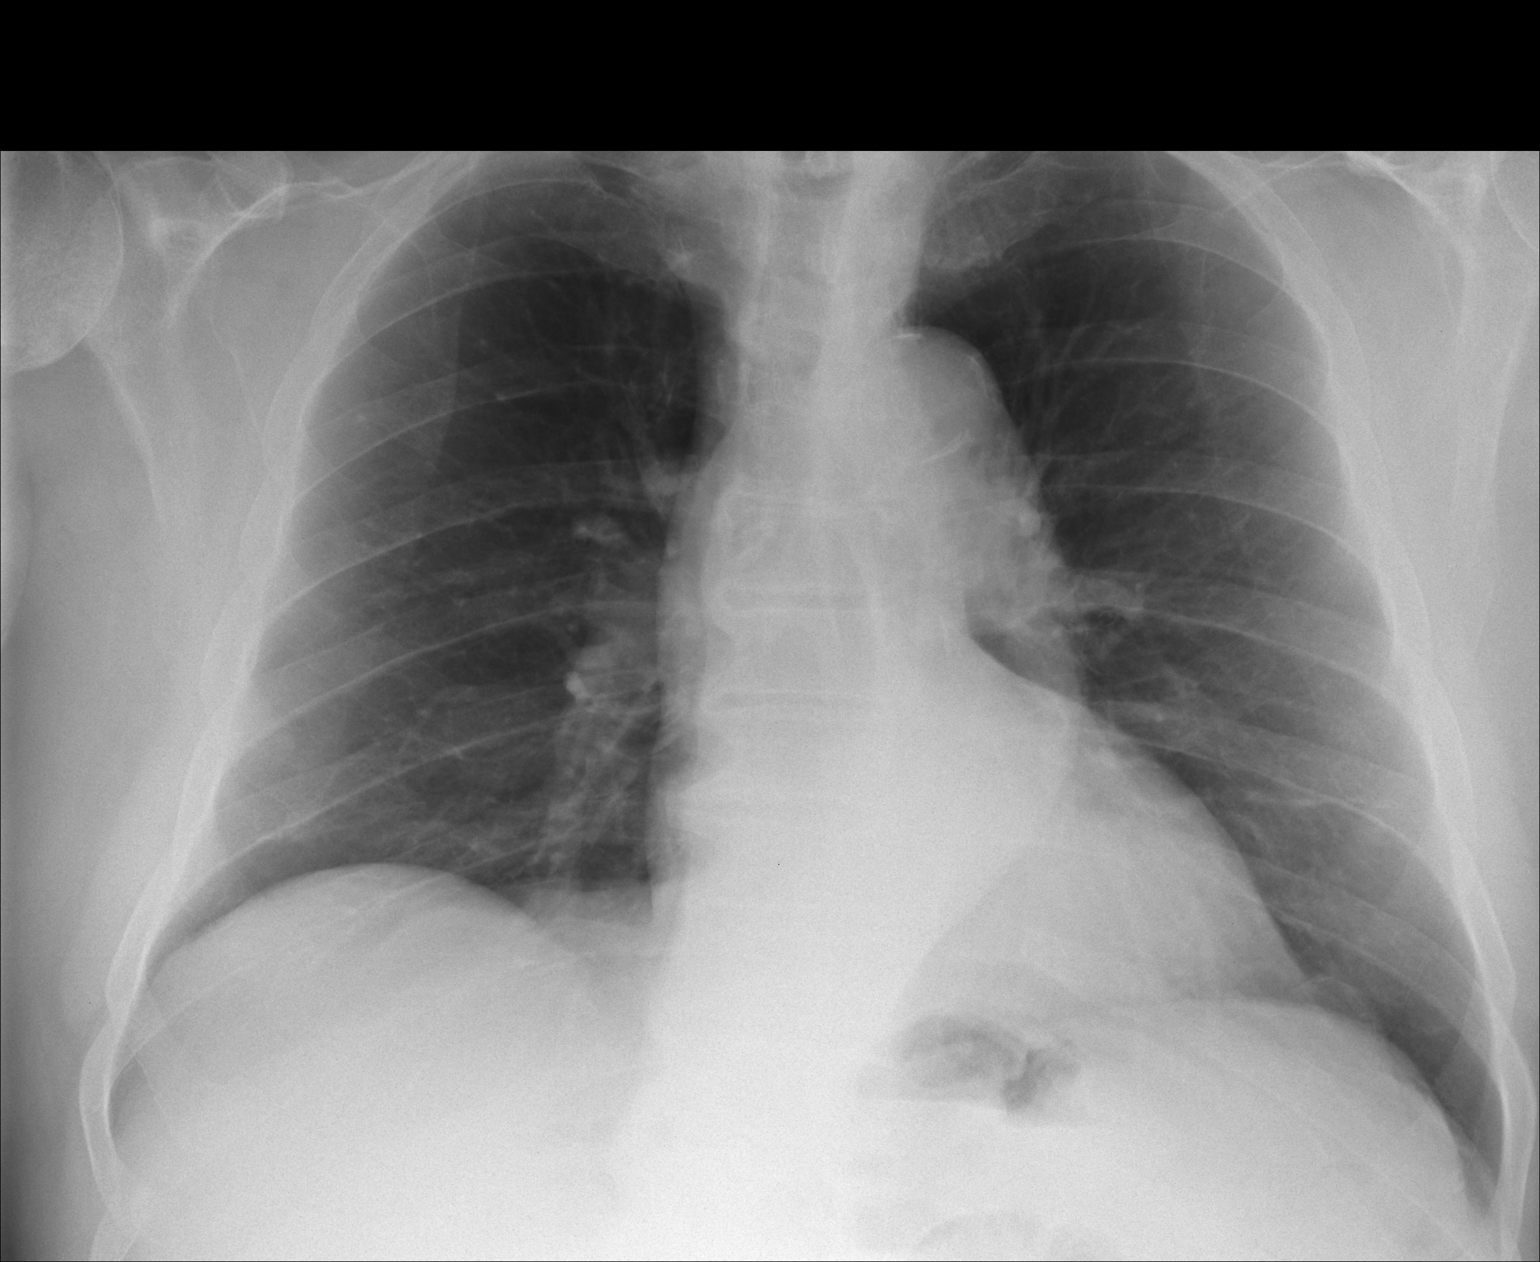

[2 of 2 positions shown; findings below may reference images not displayed]

FINDINGS: Heart size is normal.

No pleural effusion or edema identified.

Scar like opacity noted in the left base.

No airspace consolidation identified.
IMPRESSION: 1.  Left base scar.
2.  No pneumonia.

## 2013-09-13 DIAGNOSIS — M66259 Spontaneous rupture of extensor tendons, unspecified thigh: Secondary | ICD-10-CM | POA: Diagnosis not present

## 2013-11-09 DIAGNOSIS — M66259 Spontaneous rupture of extensor tendons, unspecified thigh: Secondary | ICD-10-CM | POA: Diagnosis not present

## 2013-11-09 DIAGNOSIS — IMO0002 Reserved for concepts with insufficient information to code with codable children: Secondary | ICD-10-CM | POA: Diagnosis not present

## 2013-12-29 ENCOUNTER — Ambulatory Visit (INDEPENDENT_AMBULATORY_CARE_PROVIDER_SITE_OTHER): Payer: Medicare Other | Admitting: Internal Medicine

## 2013-12-29 ENCOUNTER — Other Ambulatory Visit: Payer: Self-pay | Admitting: Internal Medicine

## 2013-12-29 ENCOUNTER — Encounter: Payer: Self-pay | Admitting: Internal Medicine

## 2013-12-29 VITALS — BP 129/94 | HR 81 | Temp 97.9°F | Resp 18 | Ht 66.5 in | Wt 233.0 lb

## 2013-12-29 DIAGNOSIS — E119 Type 2 diabetes mellitus without complications: Secondary | ICD-10-CM | POA: Diagnosis not present

## 2013-12-29 DIAGNOSIS — Z139 Encounter for screening, unspecified: Secondary | ICD-10-CM | POA: Diagnosis not present

## 2013-12-29 DIAGNOSIS — I10 Essential (primary) hypertension: Secondary | ICD-10-CM | POA: Diagnosis not present

## 2013-12-29 DIAGNOSIS — G8929 Other chronic pain: Secondary | ICD-10-CM

## 2013-12-29 DIAGNOSIS — E785 Hyperlipidemia, unspecified: Secondary | ICD-10-CM

## 2013-12-29 DIAGNOSIS — M25519 Pain in unspecified shoulder: Secondary | ICD-10-CM

## 2013-12-29 DIAGNOSIS — K219 Gastro-esophageal reflux disease without esophagitis: Secondary | ICD-10-CM

## 2013-12-29 DIAGNOSIS — Z125 Encounter for screening for malignant neoplasm of prostate: Secondary | ICD-10-CM

## 2013-12-29 LAB — CBC WITH DIFFERENTIAL/PLATELET
BASOS PCT: 1 % (ref 0–1)
Basophils Absolute: 0.1 10*3/uL (ref 0.0–0.1)
EOS ABS: 0.3 10*3/uL (ref 0.0–0.7)
EOS PCT: 4 % (ref 0–5)
HCT: 43.4 % (ref 39.0–52.0)
Hemoglobin: 15 g/dL (ref 13.0–17.0)
LYMPHS ABS: 3.4 10*3/uL (ref 0.7–4.0)
Lymphocytes Relative: 43 % (ref 12–46)
MCH: 30.9 pg (ref 26.0–34.0)
MCHC: 34.6 g/dL (ref 30.0–36.0)
MCV: 89.5 fL (ref 78.0–100.0)
Monocytes Absolute: 0.7 10*3/uL (ref 0.1–1.0)
Monocytes Relative: 9 % (ref 3–12)
NEUTROS PCT: 43 % (ref 43–77)
Neutro Abs: 3.4 10*3/uL (ref 1.7–7.7)
PLATELETS: 242 10*3/uL (ref 150–400)
RBC: 4.85 MIL/uL (ref 4.22–5.81)
RDW: 14.6 % (ref 11.5–15.5)
WBC: 7.9 10*3/uL (ref 4.0–10.5)

## 2013-12-29 LAB — MICROALBUMIN, URINE: MICROALB UR: 0.5 mg/dL (ref 0.00–1.89)

## 2013-12-29 LAB — LIPID PANEL
CHOL/HDL RATIO: 4.5 ratio
CHOLESTEROL: 177 mg/dL (ref 0–200)
HDL: 39 mg/dL — ABNORMAL LOW (ref >39)
LDL Cholesterol: 109 mg/dL — ABNORMAL HIGH (ref 0–99)
Triglycerides: 145 mg/dL (ref <150)
VLDL: 29 mg/dL (ref 0–40)

## 2013-12-29 LAB — COMPLETE METABOLIC PANEL WITH GFR
ALT: 23 U/L (ref 0–53)
AST: 19 U/L (ref 0–37)
Albumin: 4.1 g/dL (ref 3.5–5.2)
Alkaline Phosphatase: 50 U/L (ref 39–117)
BUN: 18 mg/dL (ref 6–23)
CALCIUM: 9.5 mg/dL (ref 8.4–10.5)
CHLORIDE: 101 meq/L (ref 96–112)
CO2: 27 meq/L (ref 19–32)
Creat: 1.07 mg/dL (ref 0.50–1.35)
GFR, EST AFRICAN AMERICAN: 80 mL/min
GFR, Est Non African American: 69 mL/min
Glucose, Bld: 111 mg/dL — ABNORMAL HIGH (ref 70–99)
Potassium: 4.3 mEq/L (ref 3.5–5.3)
Sodium: 137 mEq/L (ref 135–145)
TOTAL PROTEIN: 7.2 g/dL (ref 6.0–8.3)
Total Bilirubin: 0.7 mg/dL (ref 0.2–1.2)

## 2013-12-29 LAB — POCT GLYCOSYLATED HEMOGLOBIN (HGB A1C): HEMOGLOBIN A1C: 8.5

## 2013-12-29 LAB — PSA, MEDICARE: PSA: 0.82 ng/mL (ref <=4.00)

## 2013-12-29 MED ORDER — VERAPAMIL HCL ER 360 MG PO CP24: 360.0000 mg | ORAL_CAPSULE | Freq: Every morning | ORAL | Status: DC

## 2013-12-29 MED ORDER — BENAZEPRIL-HYDROCHLOROTHIAZIDE 20-12.5 MG PO TABS: 2.0000 | ORAL_TABLET | Freq: Every day | ORAL | Status: DC

## 2013-12-29 MED ORDER — OXYCODONE-ACETAMINOPHEN 5-325 MG PO TABS: 1.0000 | ORAL_TABLET | Freq: Four times a day (QID) | ORAL | Status: DC | PRN

## 2013-12-29 NOTE — Progress Notes (Addendum)
Subjective:    Patient ID: Kevin Weaver, male    DOB: 12-17-1940, 73 y.o.   MRN: [id number] This chart was scribed for Kevin Lin, MD by Anastasia Pall, ED Scribe. This patient was seen in room 27 and the patient's care was started at 11:05 AM.  Chief Complaint  Patient presents with  . Diabetes  . Hypertension   HPI Kevin Weaver is a 73 y.o. male Pt presents for DM and HTN check up.   He reports recent rupture of left quadriceps tendon, having repairing surgery. He had been having sharp stabbing pain in his knee. He is trying to recover, but has not been doing physical therapy. He has trouble walking up stairs, pressing in the clutch on his truck. He had been taking Percocet, Tylenol for his pain, is needing pain medication, receptive to "what is best".  He reports h/o colonoscopy, many years ago, swears he will never do it again.   He wanted to get his sugar levels checked out today. His home monitoring varies considerably both morning and evening without clear association to any dietary changes with some values as low as 60 and some as high as 280.  He denies having pneumonia vaccination, is preferable to not receiving it today.   He denies having his eyes checked out in a while, states he is going to see eye doctor soon, sister's doctor in [place].   He would like to have his Metformin changed, states he has excessive flatulence and it is embarrassing. He states it is worse in the mornings and evenings, directly after taking it.   He has had myalgias with statins and prefers not to take them  PCP - rpd Review of Systems  Constitutional: Negative for fever.  Musculoskeletal: Positive for myalgias (left knee).   no chest pain or palpitations No shortness of breath No unusual weight gain Continues to work despite recent injuries  Patient Active Problem List   Diagnosis Date Noted  . Rupture of left quadriceps tendon 08/03/2013  . HTN (hypertension) 03/04/2012    . DM (diabetes mellitus) 03/04/2012  . BMI 35.0-35.9,adult 03/04/2012  . Hyperlipemia 03/04/2012  . 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 Chronic shoulder pain 03/04/2012  . History of GI bleed 03/04/2012   Prior to Admission medications   Medication Sig Start Date End Date Taking? Authorizing Provider  aspirin 325 MG tablet Take 1 tablet (325 mg total) by mouth 2 (two) times daily after a meal. 08/04/13   Mcarthur Rossetti, MD  benazepril-hydrochlorthiazide (LOTENSIN HCT) 20-12.5 MG per tablet Take 2 tablets by mouth daily. 06/30/13   Leandrew Koyanagi, MD  glipiZIDE (GLUCOTROL XL) 10 MG 24 hr tablet Take 1 tablet (10 mg total) by mouth daily. 06/30/13   Leandrew Koyanagi, MD  metFORMIN (GLUCOPHAGE) 1000 MG tablet Take 1 tablet (1,000 mg total) by mouth 2 (two) times daily with a meal. 06/30/13   Leandrew Koyanagi, MD  omeprazole (PRILOSEC) 40 MG capsule Take 1 capsule (40 mg total) by mouth daily. 06/30/13   Leandrew Koyanagi, MD  oxyCODONE-acetaminophen (ROXICET) 5-325 MG per tablet Take 1-2 tablets by mouth every 6 (six) hours as needed for pain. 08/04/13   Mcarthur Rossetti, MD  verapamil (VERELAN PM) 360 MG 24 hr capsule Take 360 mg by mouth every morning. 06/30/13   Leandrew Koyanagi, MD   BP 129/94  Pulse 81  Temp(Src) 97.9 F (36.6 C) (Oral)  Resp 18  Ht 5' 6.5" (1.689 m)  Wt 233 lb (105.688 kg)  BMI 37.05 kg/m2  SpO2 95%     Objective:   Physical Exam  Nursing note and vitals reviewed. Constitutional: He is oriented to person, place, and time. He appears well-developed and well-nourished. No distress.  HENT:  Head: Normocephalic and atraumatic.  Eyes: EOM are normal.  Neck: Neck supple.  Cardiovascular: Normal rate.   Pulmonary/Chest: Effort normal. No respiratory distress.  Musculoskeletal: Normal range of motion.  Neurological: He is alert and oriented to person, place, and time.  Skin: Skin is warm and dry.  Psychiatric: He has  a normal mood and affect. His behavior is normal.   sensory intact to his lower extremities to monofilament  Results for orders placed in visit on 12/29/13  COMPLETE METABOLIC PANEL WITH GFR      Result Value Ref Range   Sodium 137  135 - 145 mEq/L   Potassium 4.3  3.5 - 5.3 mEq/L   Chloride 101  96 - 112 mEq/L   CO2 27  19 - 32 mEq/L   Glucose, Bld 111 (*) 70 - 99 mg/dL   BUN 18  6 - 23 mg/dL   Creat 1.07  0.50 - 1.35 mg/dL   Total Bilirubin 0.7  0.2 - 1.2 mg/dL   Alkaline Phosphatase 50  39 - 117 U/L   AST 19  0 - 37 U/L   ALT 23  0 - 53 U/L   Total Protein 7.2  6.0 - 8.3 g/dL   Albumin 4.1  3.5 - 5.2 g/dL   Calcium 9.5  8.4 - 10.5 mg/dL   GFR, Est African American 80     GFR, Est Non African American 69    CBC WITH DIFFERENTIAL      Result Value Ref Range   WBC 7.9  4.0 - 10.5 K/uL   RBC 4.85  4.22 - 5.81 MIL/uL   Hemoglobin 15.0  13.0 - 17.0 g/dL   HCT 43.4  39.0 - 52.0 %   MCV 89.5  78.0 - 100.0 fL   MCH 30.9  26.0 - 34.0 pg   MCHC 34.6  30.0 - 36.0 g/dL   RDW 14.6  11.5 - 15.5 %   Platelets 242  150 - 400 K/uL   Neutrophils Relative % 43  43 - 77 %   Neutro Abs 3.4  1.7 - 7.7 K/uL   Lymphocytes Relative 43  12 - 46 %   Lymphs Abs 3.4  0.7 - 4.0 K/uL   Monocytes Relative 9  3 - 12 %   Monocytes Absolute 0.7  0.1 - 1.0 K/uL   Eosinophils Relative 4  0 - 5 %   Eosinophils Absolute 0.3  0.0 - 0.7 K/uL   Basophils Relative 1  0 - 1 %   Basophils Absolute 0.1  0.0 - 0.1 K/uL   Smear Review Criteria for review not met    LIPID PANEL      Result Value Ref Range   Cholesterol 177  0 - 200 mg/dL   Triglycerides 145  <150 mg/dL   HDL 39 (*) >39 mg/dL   Total CHOL/HDL Ratio 4.5     VLDL 29  0 - 40 mg/dL   LDL Cholesterol 109 (*) 0 - 99 mg/dL  PSA, MEDICARE      Result Value Ref Range   PSA    <=4.00 ng/mL  MICROALBUMIN, URINE      Result Value  Ref Range   Microalb, Ur 0.50  0.00 - 1.89 mg/dL  POCT GLYCOSYLATED HEMOGLOBIN (HGB A1C)      Result Value Ref Range    Hemoglobin A1C 8.5        Assessment & Plan:  Diabetes   - Plan: In order to continue metformin and address his complaints of side effects we will try to use a dose of  2000 mg at bedtime of the extended release formula/continue glipizide///he does not want to change to insulin.  HTN (hypertension) - Plan: Continue Lotensin HCT  Chronic shoulder pain/pain from recent injury/chronic ankle pain  He intermittently needs pain medication and this is refilled today  Hyperlipemia - Plan: He will continue to use diet here as he is adverse to statins  Screening for prostate cancer--PSA pending   Recheck 4-6 months  Meds ordered this encounter  Medications  . oxyCODONE-acetaminophen (ROXICET) 5-325 MG per tablet    Sig: Take 1-2 tablets by mouth every 6 (six) hours as needed.    Dispense:  120 tablet    Refill:  0  . benazepril-hydrochlorthiazide (LOTENSIN HCT) 20-12.5 MG per tablet    Sig: Take 2 tablets by mouth daily.    Dispense:  180 tablet    Refill:  3  . verapamil (VERELAN PM) 360 MG 24 hr capsule    Sig: Take 1 capsule (360 mg total) by mouth every morning.    Dispense:  90 capsule    Refill:  3  . metFORMIN (GLUCOPHAGE XR) 750 MG 24 hr tablet    Sig: Take 3 tablets at bedtime    Dispense:  270 tablet    Refill:  1  . omeprazole (PRILOSEC) 40 MG capsule    Sig: Take 1 capsule (40 mg total) by mouth daily.    Dispense:  90 capsule    Refill:  1   glipizide was refilled by PA Marte     I have completed the patient encounter in its entirety as documented by the scribe, with editing by me where necessary. Chantavia Bazzle P. Laney Pastor, M.D.

## 2013-12-30 ENCOUNTER — Encounter: Payer: Self-pay | Admitting: Internal Medicine

## 2013-12-30 MED ORDER — METFORMIN HCL ER 750 MG PO TB24: ORAL_TABLET | ORAL | Status: DC

## 2013-12-30 MED ORDER — OMEPRAZOLE 40 MG PO CPDR
40.0000 mg | DELAYED_RELEASE_CAPSULE | Freq: Every day | ORAL | Status: DC
Start: 2013-12-30 — End: 2014-02-24

## 2014-01-07 LAB — IFOBT (OCCULT BLOOD): IMMUNOLOGICAL FECAL OCCULT BLOOD TEST: POSITIVE

## 2014-01-07 NOTE — Addendum Note (Signed)
Addended byLevon Hedger: Fawzi Melman A on: 01/07/2014 12:06 PM   Modules accepted: Orders

## 2014-01-10 ENCOUNTER — Other Ambulatory Visit: Payer: Self-pay | Admitting: *Deleted

## 2014-01-10 DIAGNOSIS — K921 Melena: Secondary | ICD-10-CM

## 2014-01-11 ENCOUNTER — Encounter: Payer: Self-pay | Admitting: Gastroenterology

## 2014-01-17 DIAGNOSIS — H2510 Age-related nuclear cataract, unspecified eye: Secondary | ICD-10-CM | POA: Diagnosis not present

## 2014-01-17 DIAGNOSIS — E119 Type 2 diabetes mellitus without complications: Secondary | ICD-10-CM | POA: Diagnosis not present

## 2014-01-17 DIAGNOSIS — H524 Presbyopia: Secondary | ICD-10-CM | POA: Diagnosis not present

## 2014-02-24 ENCOUNTER — Ambulatory Visit (INDEPENDENT_AMBULATORY_CARE_PROVIDER_SITE_OTHER): Payer: Medicare Other | Admitting: Gastroenterology

## 2014-02-24 ENCOUNTER — Encounter: Payer: Self-pay | Admitting: Gastroenterology

## 2014-02-24 VITALS — BP 120/70 | HR 76 | Ht 67.0 in | Wt 238.4 lb

## 2014-02-24 DIAGNOSIS — E119 Type 2 diabetes mellitus without complications: Secondary | ICD-10-CM

## 2014-02-24 DIAGNOSIS — R195 Other fecal abnormalities: Secondary | ICD-10-CM | POA: Diagnosis not present
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
## 2014-02-24 NOTE — Assessment & Plan Note (Signed)
Hemoccult-positive stool-rule out occult GI bleeding sources including polyps, AVMs, neoplasm and hemorrhoids.  An upper GI source is less likely.  Recommendations #1 colonoscopy; if negative I will obtain followup Hemoccults

## 2014-02-24 NOTE — Patient Instructions (Signed)
It has been recommended to you by your physician that you have a(n)Colonoscopy  completed. Per your request, we did not schedule the procedure(s) today. Please contact our office at [phone] should you decide to have the procedure completed. 

## 2014-02-24 NOTE — Assessment & Plan Note (Signed)
Stable

## 2014-02-25 ENCOUNTER — Telehealth: Payer: Self-pay

## 2014-02-25 MED ORDER — METFORMIN HCL ER (MOD) 1000 MG PO TB24: 1000.0000 mg | ORAL_TABLET | Freq: Two times a day (BID) | ORAL | Status: DC

## 2014-02-25 MED ORDER — METFORMIN HCL ER (MOD) 500 MG PO TB24: 1000.0000 mg | ORAL_TABLET | Freq: Two times a day (BID) | ORAL | Status: DC

## 2014-02-25 NOTE — Addendum Note (Signed)
Addended by: Sheppard PlumberBRIGGS, Eddith Mentor A on: 02/25/2014 01:26 PM   Modules accepted: Orders

## 2014-02-25 NOTE — Telephone Encounter (Addendum)
Pharm reported that 1000ER tablets are non-formulary and can't even do a PA for them. Checked w/Sarah and will send in Rx for 500 mg ER tablets, 2 tabs BID, which is covered by ins and even less expensive for pt. Dr Merla Richesoolittle, Lorain ChildesFYI

## 2014-02-25 NOTE — Telephone Encounter (Signed)
Pharm called for stat PA on Metformin ER 750, 3 tabs day d/t quantity limit of 2/day. Pt had prev been on IM metformin 1000 BID but was unable to tolerate GI SEs. Ins Medicare PDP BIN # [id number], ID # [id number], ph [phone]. Spoke w/Sarah to clarify whether she thought intent of Dr Merla Richesoolittle was to order 3 tabs of ER QD. Maralyn SagoSarah advised change to 1000 mg ER tabs BID so dosage is total of 2000 mg daily. Notified pharm of change.

## 2014-05-19 ENCOUNTER — Ambulatory Visit (INDEPENDENT_AMBULATORY_CARE_PROVIDER_SITE_OTHER): Payer: Self-pay | Admitting: Emergency Medicine

## 2014-05-19 VITALS — BP 134/86 | HR 76 | Temp 97.7°F | Resp 18 | Ht 65.75 in | Wt 236.0 lb

## 2014-05-19 DIAGNOSIS — Z024 Encounter for examination for driving license: Secondary | ICD-10-CM

## 2014-05-19 DIAGNOSIS — Z0289 Encounter for other administrative examinations: Secondary | ICD-10-CM
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
## 2014-07-06 ENCOUNTER — Encounter: Payer: Self-pay | Admitting: Internal Medicine

## 2014-07-06 ENCOUNTER — Telehealth: Payer: Self-pay

## 2014-07-06 ENCOUNTER — Ambulatory Visit (INDEPENDENT_AMBULATORY_CARE_PROVIDER_SITE_OTHER): Payer: Medicare Other | Admitting: Internal Medicine

## 2014-07-06 VITALS — BP 116/78 | HR 71 | Temp 97.7°F | Resp 16 | Ht 67.0 in | Wt 231.0 lb

## 2014-07-06 DIAGNOSIS — E119 Type 2 diabetes mellitus without complications: Secondary | ICD-10-CM | POA: Diagnosis not present

## 2014-07-06 LAB — POCT GLYCOSYLATED HEMOGLOBIN (HGB A1C): Hemoglobin A1C: 10.2

## 2014-07-06 MED ORDER — METFORMIN HCL ER (MOD) 500 MG PO TB24: 1000.0000 mg | ORAL_TABLET | Freq: Two times a day (BID) | ORAL | Status: DC

## 2014-07-06 MED ORDER — GLIPIZIDE ER 10 MG PO TB24: ORAL_TABLET | ORAL | Status: DC

## 2014-07-06 NOTE — Telephone Encounter (Signed)
A pharmacist at [facility] called about clarification on a recent prescription sent to the pharmacy.  The prescription written on 07/06/14 was for MetFORMIN (GLUMETZA) 500 MG (MOD) 24 hr tablet.  She said the prescription was a 24 hr tablet, but the instructions were to take two every day.  She said that you cannot take 2 doses of a 24 hr tablet.  She said that he previously took Metformin ER 12 hr tablets two times a day.  She wanted clarification on the type of tablet and the instructions for patient use.  Please advise.  Thank you.

## 2014-07-06 NOTE — Telephone Encounter (Signed)
Left message on machine to call back to call back to verify what dosage he has been taking.

## 2014-07-06 NOTE — Progress Notes (Signed)
Subjective:    Patient ID: Kevin Weaver, male    DOB: [birth year], 73 y.o.   MRN: [id number]  This chart was scribed for Kevin Pearson, MD by Kevin Weaver, ED Scribe. This patient was seen in room 25 and the patient's care was started at 10:50 AM.   HPI HPI Comments: Kevin Weaver is a 73 y.o. male who [facility] for diabetes follow-up.  Patient Active Problem List   Diagnosis Date Noted  . HTN (hypertension) 03/04/2012    Priority: High  . DM (diabetes mellitus) 03/04/2012    Priority: Medium  . BMI 35.0-35.9,adult 03/04/2012    Priority: Medium  . Nonspecific abnormal finding in stool contents 02/24/2014  . Rupture of left quadriceps tendon 08/03/2013  . Hyperlipemia 03/04/2012  . 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 Chronic shoulder pain 03/04/2012  . History of GI bleed 03/04/2012   Prior to Admission medications   Medication Sig Start Date End Date Taking? Authorizing Provider  benazepril-hydrochlorthiazide (LOTENSIN HCT) 20-12.5 MG per tablet Take 2 tablets by mouth daily. 12/29/13  Yes Kevin Pearson, MD  glipiZIDE (GLUCOTROL XL) 10 MG 24 hr tablet TAKE 1 TABLET (10 MG TOTAL) BY MOUTH DAILY. 12/29/13  Yes Kevin Pearson, MD  omeprazole (PRILOSEC) 40 MG capsule Take 40 mg by mouth as needed. 12/30/13  Yes Kevin Pearson, MD  verapamil (VERELAN PM) 360 MG 24 hr capsule Take 1 capsule (360 mg total) by mouth every morning. 12/29/13  Yes Kevin Pearson, MD  metFORMIN (GLUCOPHAGE) 500 MG tablet Take 2 tablets (1,000 mg total) by mouth 2 (two) times daily with a meal. 12/29/13   Kevin Pearson, MD  oxyCODONE-acetaminophen (ROXICET) 5-325 MG per tablet Take 1-2 tablets by mouth every 6 (six) hours as needed. 12/29/13   Kevin Pearson, MD  No problems/feeling well  Note aoided colonos  Review of Systems Non-contributory No chestpain /palpit Gi/gu stable No vision changes    Objective:   Physical Exam  Nursing note and vitals reviewed. Constitutional: He is oriented to person, place, and time. He appears well-developed and well-nourished. No distress.  HENT:  Head: Normocephalic and atraumatic.  Eyes: Conjunctivae and EOM are normal. Pupils are equal, round, and reactive to light.  Neck: Normal range of motion. Neck supple.  Cardiovascular: Normal rate, regular rhythm and normal heart sounds.   No murmur heard. Pulmonary/Chest: Effort normal and breath sounds normal.  Musculoskeletal: Normal range of motion.  Neurological: He is alert and oriented to person, place, and time.  sens intact feet  Skin: Skin is warm and dry.  Psychiatric: He has a normal mood and affect. His behavior is normal. Judgment and thought content normal.      Assessment & Plan:   Type II or unspecified type diabetes mellitus without mention of complication, not stated as uncontrolled - Plan: POCT glycosylated hemoglobin (Hb A1C)  Results for orders placed in visit on 07/06/14  POCT GLYCOSYLATED HEMOGLOBIN (HGB A1C)      Result Value Ref Range   Hemoglobin A1C 10.2     Decided diet was best rather than new meds due to expense  Patient Instructions  No between meal snacks except broccoli cauliflower celery or carrots(may eat as desired) No second helpings No sugar in beverages No foods from packages Avoid white foods(pasta,potatoes,rice,bread,corn) Wheat bread of high-quality is the only bread to be eaten.    Reck 53mo  DMV papers were filled out last yr w/ error requiring reexams yearly-he does not need this -he's stable and gets DOT yearly---I told DMV no need for f/u  I have completed the patient encounter in its entirety as documented by the scribe, with editing by me where necessary. Hazle Ogburn P. Merla Riches, M.D.

## 2014-07-06 NOTE — Patient Instructions (Signed)
No between meal snacks except broccoli cauliflower celery or carrots(may eat as desired) No second helpings No sugar in beverages No foods from packages Avoid white foods(pasta,potatoes,rice,bread,corn) Wheat bread of high-quality is the only bread to be eaten.

## 2014-07-07 MED ORDER — METFORMIN HCL 500 MG PO TABS: 1000.0000 mg | ORAL_TABLET | Freq: Two times a day (BID) | ORAL | Status: DC

## 2014-07-07 NOTE — Telephone Encounter (Signed)
Spoke to pharmacy- pt was on Metformin ER 12 hr 2 tab BID. Changed rx to last prescription sent. The wrong metformin was entered in error. Per ov no change in medication indicated.

## 2014-10-03 ENCOUNTER — Telehealth: Payer: Self-pay | Admitting: Radiology

## 2014-10-03 NOTE — Telephone Encounter (Signed)
Renay called left message for patient to call back, to see if he has had flu shot, if not we can have patient come in for this service

## 2014-11-14 ENCOUNTER — Telehealth: Payer: Self-pay | Admitting: Family Medicine

## 2015-01-04 ENCOUNTER — Encounter: Payer: Self-pay | Admitting: Internal Medicine

## 2015-01-04 ENCOUNTER — Ambulatory Visit (INDEPENDENT_AMBULATORY_CARE_PROVIDER_SITE_OTHER): Payer: Medicare Other | Admitting: Internal Medicine

## 2015-01-04 VITALS — BP 115/78 | HR 72 | Temp 97.9°F | Resp 16 | Ht 67.0 in | Wt 222.0 lb

## 2015-01-04 DIAGNOSIS — Z6835 Body mass index (BMI) 35.0-35.9, adult: Secondary | ICD-10-CM | POA: Diagnosis not present

## 2015-01-04 DIAGNOSIS — E119 Type 2 diabetes mellitus without complications: Secondary | ICD-10-CM | POA: Diagnosis not present

## 2015-01-04 DIAGNOSIS — E785 Hyperlipidemia, unspecified: Secondary | ICD-10-CM | POA: Diagnosis not present

## 2015-01-04 DIAGNOSIS — I10 Essential (primary) hypertension: Secondary | ICD-10-CM | POA: Diagnosis not present

## 2015-01-04 DIAGNOSIS — Z23 Encounter for immunization: Secondary | ICD-10-CM | POA: Diagnosis not present

## 2015-01-04 LAB — CBC WITH DIFFERENTIAL/PLATELET
BASOS ABS: 0.1 10*3/uL (ref 0.0–0.1)
BASOS PCT: 1 % (ref 0–1)
EOS ABS: 0.1 10*3/uL (ref 0.0–0.7)
Eosinophils Relative: 2 % (ref 0–5)
HEMATOCRIT: 43 % (ref 39.0–52.0)
Hemoglobin: 14.6 g/dL (ref 13.0–17.0)
LYMPHS ABS: 2.6 10*3/uL (ref 0.7–4.0)
LYMPHS PCT: 42 % (ref 12–46)
MCH: 30.9 pg (ref 26.0–34.0)
MCHC: 34 g/dL (ref 30.0–36.0)
MCV: 90.9 fL (ref 78.0–100.0)
MONO ABS: 0.8 10*3/uL (ref 0.1–1.0)
MONOS PCT: 12 % (ref 3–12)
MPV: 10.3 fL (ref 8.6–12.4)
NEUTROS ABS: 2.7 10*3/uL (ref 1.7–7.7)
Neutrophils Relative %: 43 % (ref 43–77)
Platelets: 230 10*3/uL (ref 150–400)
RBC: 4.73 MIL/uL (ref 4.22–5.81)
RDW: 13.5 % (ref 11.5–15.5)
WBC: 6.3 10*3/uL (ref 4.0–10.5)

## 2015-01-04 LAB — LIPID PANEL
Cholesterol: 155 mg/dL (ref 0–200)
HDL: 35 mg/dL — AB (ref >=40)
LDL Cholesterol: 101 mg/dL — ABNORMAL HIGH (ref 0–99)
Total CHOL/HDL Ratio: 4.4 Ratio
Triglycerides: 97 mg/dL (ref <150)
VLDL: 19 mg/dL (ref 0–40)

## 2015-01-04 LAB — COMPREHENSIVE METABOLIC PANEL
ALK PHOS: 48 U/L (ref 39–117)
ALT: 25 U/L (ref 0–53)
AST: 23 U/L (ref 0–37)
Albumin: 3.9 g/dL (ref 3.5–5.2)
BILIRUBIN TOTAL: 0.9 mg/dL (ref 0.2–1.2)
BUN: 24 mg/dL — ABNORMAL HIGH (ref 6–23)
CALCIUM: 9.4 mg/dL (ref 8.4–10.5)
CHLORIDE: 99 meq/L (ref 96–112)
CO2: 23 meq/L (ref 19–32)
Creat: 1.18 mg/dL (ref 0.50–1.35)
Glucose, Bld: 135 mg/dL — ABNORMAL HIGH (ref 70–99)
Potassium: 4.3 mEq/L (ref 3.5–5.3)
SODIUM: 136 meq/L (ref 135–145)
Total Protein: 6.8 g/dL (ref 6.0–8.3)

## 2015-01-04 LAB — MICROALBUMIN, URINE: MICROALB UR: 0.2 mg/dL (ref <2.0)

## 2015-01-04 LAB — POCT GLYCOSYLATED HEMOGLOBIN (HGB A1C): HEMOGLOBIN A1C: 9.2

## 2015-01-04 MED ORDER — GLIPIZIDE 10 MG PO TABS
10.0000 mg | ORAL_TABLET | Freq: Two times a day (BID) | ORAL | Status: DC
Start: 2015-01-04 — End: 2016-08-25

## 2015-01-04 MED ORDER — VERAPAMIL HCL ER 360 MG PO CP24: 360.0000 mg | ORAL_CAPSULE | Freq: Every morning | ORAL | Status: DC

## 2015-01-04 MED ORDER — GLIPIZIDE ER 10 MG PO TB24: ORAL_TABLET | ORAL | Status: DC

## 2015-01-04 MED ORDER — METFORMIN HCL 850 MG PO TABS: 850.0000 mg | ORAL_TABLET | Freq: Three times a day (TID) | ORAL | Status: DC

## 2015-01-04 MED ORDER — BENAZEPRIL-HYDROCHLOROTHIAZIDE 20-12.5 MG PO TABS: 2.0000 | ORAL_TABLET | Freq: Every day | ORAL | Status: DC

## 2015-01-04 MED ORDER — METFORMIN HCL 500 MG PO TABS: 1000.0000 mg | ORAL_TABLET | Freq: Two times a day (BID) | ORAL | Status: DC

## 2015-01-04 NOTE — Progress Notes (Signed)
Subjective:    Patient ID: Kevin Weaver, male    DOB: [birth year], 74 y.o.   MRN: [id number]  HPI 6 month follow-up Patient Active Problem List   Diagnosis Date Noted  . HTN (hypertension) 03/04/2012    Priority: High  . DM (diabetes mellitus) 03/04/2012    Priority: Medium  . BMI 35.0-35.9,adult 03/04/2012    Priority: Medium  . Nonspecific abnormal finding in stool contents 02/24/2014  . Rupture of left quadriceps tendon 08/03/2013  . Hyperlipemia 03/04/2012  . 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 Chronic shoulder pain 03/04/2012  . History of GI bleed 03/04/2012  no longer needs omeprazole   Prior to Admission medications   Medication Sig Start Date End Date Taking? Authorizing Provider  benazepril-hydrochlorthiazide (LOTENSIN HCT) 20-12.5 MG per tablet Take 2 tablets by mouth daily. 01/04/15  Yes Tonye Pearson, MD  glipiZIDE (GLUCOTROL XL) 10 MG 24 hr tablet TAKE 1 TABLET (10 MG TOTAL) BY MOUTH DAILY. 01/04/15  Yes Tonye Pearson, MD  metFORMIN (GLUCOPHAGE) 500 MG tablet Take 2 tablets (1,000 mg total) by mouth 2 (two) times daily with a meal. 01/04/15  Yes Tonye Pearson, MD  verapamil (VERELAN PM) 360 MG 24 hr capsule Take 1 capsule (360 mg total) by mouth every morning. 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  HENT: Negative for trouble swallowing.   Eyes: Negative for visual disturbance.  Respiratory: Negative for shortness of breath and wheezing.   Cardiovascular: Negative for chest pain, palpitations and leg swelling.  Gastrointestinal: Negative for abdominal pain.  Genitourinary: Negative for difficulty urinating.  Neurological: Negative for weakness and headaches.    Psychiatric/Behavioral: Negative for confusion and sleep disturbance.       Objective:   Physical Exam BP 115/78 mmHg  Pulse 72  Temp(Src) 97.9 F (36.6 C)  Resp 16  Ht  (1.702 m)  Wt 222 lb (100.699 kg)  BMI 34.76 kg/m2  SpO2 97% HEENT clear Heart regular without murmur No carotid bruits Lungs clear No peripheral edema Good peripheral pulses Plantar sensation intact Cranial nerves II through XII intact   Results for orders placed or performed in visit on 01/04/15  POCT glycosylated hemoglobin (Hb A1C)  Result Value Ref Range   Hemoglobin A1C 9.2        Assessment & Plan:  Essential hypertension - Plan: CBC with Differential/Platelet, Comprehensive metabolic panel, benazepril-hydrochlorthiazide (LOTENSIN HCT) 20-12.5 MG per tablet  Type 2 diabetes mellitus without complication - Plan: POCT glycosylated hemoglobin (Hb A1C), Microalbumin, urine  BMI 35.0-35.9,adult  Hyperlipemia - Plan: Lipid panel  Need for Tdap vaccination - Plan: Tdap vaccine greater than or equal to 7yo IM  With diabetes uncontrolled we will change his medication Increase metformin to 850 3 times a day Increase Glucotrol to 10 mg twice a day Recheck A1c in 3-6 months  Meds ordered this encounter  Medications  . verapamil (VERELAN PM) 360 MG 24 hr capsule    Sig: Take 1 capsule (360 mg total) by mouth every morning.    Dispense:  90 capsule    Refill:  3  . DISCONTD: metFORMIN (GLUCOPHAGE) 500 MG tablet    Sig: Take 2 tablets (1,000 mg total) by mouth 2 (two) times  daily with a meal.    Dispense:  360 tablet    Refill:  1  . DISCONTD: glipiZIDE (GLUCOTROL XL) 10 MG 24 hr tablet    Sig: TAKE 1 TABLET (10 MG TOTAL) BY MOUTH DAILY.    Dispense:  90 tablet    Refill:  1  . benazepril-hydrochlorthiazide (LOTENSIN HCT) 20-12.5 MG per tablet    Sig: Take 2 tablets by mouth daily.    Dispense:  180 tablet    Refill:  3  . metFORMIN (GLUCOPHAGE) 850 MG tablet    Sig: Take 1 tablet  (850 mg total) by mouth 3 (three) times daily.    Dispense:  270 tablet    Refill:  3  . glipiZIDE (GLUCOTROL) 10 MG tablet    Sig: Take 1 tablet (10 mg total) by mouth 2 (two) times daily before a meal.    Dispense:  180 tablet    Refill:  3

## 2015-01-05 ENCOUNTER — Encounter: Payer: Self-pay | Admitting: Internal Medicine

## 2015-01-06 ENCOUNTER — Other Ambulatory Visit: Payer: Self-pay

## 2015-01-06 MED ORDER — METFORMIN HCL 850 MG PO TABS: 850.0000 mg | ORAL_TABLET | Freq: Three times a day (TID) | ORAL | Status: DC

## 2015-01-13 ENCOUNTER — Telehealth: Payer: Self-pay | Admitting: Family Medicine

## 2015-01-13 NOTE — Telephone Encounter (Signed)
Called patient asked him if he wants to get a flu shot patient declined

## 2015-01-30 DIAGNOSIS — E119 Type 2 diabetes mellitus without complications: Secondary | ICD-10-CM | POA: Diagnosis not present

## 2015-01-30 DIAGNOSIS — H02834 Dermatochalasis of left upper eyelid: Secondary | ICD-10-CM | POA: Diagnosis not present

## 2015-01-30 DIAGNOSIS — H2513 Age-related nuclear cataract, bilateral: Secondary | ICD-10-CM | POA: Diagnosis not present

## 2015-01-30 DIAGNOSIS — H02831 Dermatochalasis of right upper eyelid: Secondary | ICD-10-CM | POA: Diagnosis not present

## 2015-01-30 LAB — HM DIABETES EYE EXAM

## 2015-05-24 ENCOUNTER — Ambulatory Visit (INDEPENDENT_AMBULATORY_CARE_PROVIDER_SITE_OTHER): Payer: Self-pay | Admitting: Physician Assistant

## 2015-05-24 ENCOUNTER — Ambulatory Visit (INDEPENDENT_AMBULATORY_CARE_PROVIDER_SITE_OTHER): Payer: Medicare Other | Admitting: Physician Assistant

## 2015-05-24 ENCOUNTER — Encounter: Payer: Self-pay | Admitting: Physician Assistant

## 2015-05-24 VITALS — BP 122/72 | HR 72

## 2015-05-24 VITALS — BP 122/72 | HR 72 | Temp 97.8°F | Resp 17 | Ht 67.5 in | Wt 215.0 lb

## 2015-05-24 DIAGNOSIS — Z021 Encounter for pre-employment examination: Secondary | ICD-10-CM

## 2015-05-24 DIAGNOSIS — Z024 Encounter for examination for driving license: Secondary | ICD-10-CM

## 2015-05-24 DIAGNOSIS — E119 Type 2 diabetes mellitus without complications: Secondary | ICD-10-CM

## 2015-05-24 LAB — COMPREHENSIVE METABOLIC PANEL
ALK PHOS: 63 U/L (ref 40–115)
ALT: 16 U/L (ref 9–46)
AST: 15 U/L (ref 10–35)
Albumin: 4.1 g/dL (ref 3.6–5.1)
BILIRUBIN TOTAL: 0.7 mg/dL (ref 0.2–1.2)
BUN: 21 mg/dL (ref 7–25)
CO2: 27 meq/L (ref 20–31)
CREATININE: 1.01 mg/dL (ref 0.70–1.18)
Calcium: 9.4 mg/dL (ref 8.6–10.3)
Chloride: 100 mEq/L (ref 98–110)
GLUCOSE: 139 mg/dL — AB (ref 65–99)
POTASSIUM: 3.9 meq/L (ref 3.5–5.3)
SODIUM: 138 meq/L (ref 135–146)
Total Protein: 7 g/dL (ref 6.1–8.1)

## 2015-05-24 LAB — GLUCOSE, POCT (MANUAL RESULT ENTRY): POC Glucose: 135 mg/dl — AB (ref 70–99)

## 2015-05-24 LAB — POCT GLYCOSYLATED HEMOGLOBIN (HGB A1C): Hemoglobin A1C: 8.5

## 2015-05-24 NOTE — Progress Notes (Signed)
Patient ID: Kevin Weaver, male    DOB: September 23, 1941, 74 y.o.   MRN: [id number]  PCP: Tonye Pearson, MD  Subjective:  No chief complaint on file.   HPI Presents for evaluation of diabetes. 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 At that time, metformin was increased to 850 mg TID and glipizide was increased to 10 mg BID. His next visit is 07/05/2015.   Review of Systems  Constitutional: Negative for activity change, appetite change, fatigue and unexpected weight change.  HENT: Negative for congestion, dental problem, ear pain, hearing loss, mouth sores, postnasal drip, rhinorrhea, sneezing, sore throat, tinnitus and trouble swallowing.   Eyes: Negative for photophobia, pain, redness and visual disturbance.  Respiratory: Negative for cough, chest tightness and shortness of breath.   Cardiovascular: Negative for chest pain, palpitations and leg swelling.  Gastrointestinal: Negative for nausea, vomiting, abdominal pain, diarrhea, constipation and blood in stool.  Genitourinary: Negative for dysuria, urgency, frequency and hematuria.  Musculoskeletal: Negative for myalgias, arthralgias, gait problem and neck stiffness.  Skin: Negative for rash.  Neurological: Negative for dizziness, speech difficulty, weakness, light-headedness, numbness and headaches.  Hematological: Negative for adenopathy.  Psychiatric/Behavioral: Negative for confusion and sleep disturbance. The patient is not nervous/anxious.        Patient Active Problem List   Diagnosis Date Noted  . Nonspecific abnormal finding in stool contents 02/24/2014  . Rupture of left quadriceps tendon 08/03/2013  . HTN (hypertension) 03/04/2012  . DM (diabetes mellitus) 03/04/2012  . BMI 35.0-35.9,adult 03/04/2012  . Hyperlipemia 03/04/2012  . 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 Chronic shoulder pain 03/04/2012  . History of GI bleed 03/04/2012      Prior to Admission medications   Medication Sig Start Date End Date Taking? Authorizing Provider  benazepril-hydrochlorthiazide (LOTENSIN HCT) 20-12.5 MG per tablet Take 2 tablets by mouth daily. 01/04/15   Tonye Pearson, MD  glipiZIDE (GLUCOTROL) 10 MG tablet Take 1 tablet (10 mg total) by mouth 2 (two) times daily before a meal. 01/04/15   Tonye Pearson, MD  metFORMIN (GLUCOPHAGE) 850 MG tablet Take 1 tablet (850 mg total) by mouth 3 (three) times daily. 01/06/15   Tonye Pearson, MD  verapamil (VERELAN PM) 360 MG 24 hr capsule Take 1 capsule (360 mg total) by mouth every morning. 01/04/15   Tonye Pearson, MD     Allergies  Allergen Reactions  . Codeine Itching       Objective:  Physical Exam  Constitutional: He is oriented to person, place, and time. He appears well-developed and well-nourished. He is active and cooperative. No distress.  BP 122/72 mmHg  Pulse 72   Eyes: Conjunctivae are normal.  Pulmonary/Chest: Effort normal.  Neurological: He is alert and oriented to person, place, and time.  Psychiatric: He has a normal mood and affect. His speech is normal and behavior is normal.       Results for orders placed or performed in visit on 05/24/15  POCT glucose (manual entry)  Result Value Ref Range   POC Glucose 135 (A) 70 - 99 mg/dl  POCT glycosylated hemoglobin (Hb A1C)  Result Value Ref Range   Hemoglobin A1C 8.5        Assessment & Plan:   1. Type 2 diabetes mellitus without complication Improved control, but still above goal. I will share this information with his PCP, who may want to  make changes in his regimen before his visit 07/05/2015. - POCT glucose (manual entry) - POCT glycosylated hemoglobin (Hb A1C) - Comprehensive metabolic panel   Fernande Bras, PA-C Physician Assistant-Certified [facility]
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
## 2015-07-05 ENCOUNTER — Ambulatory Visit (INDEPENDENT_AMBULATORY_CARE_PROVIDER_SITE_OTHER): Payer: Medicare Other | Admitting: Internal Medicine

## 2015-07-05 ENCOUNTER — Encounter: Payer: Self-pay | Admitting: Internal Medicine

## 2015-07-05 VITALS — BP 145/82 | HR 71 | Temp 97.9°F | Resp 16 | Ht 67.0 in | Wt 208.0 lb

## 2015-07-05 DIAGNOSIS — E119 Type 2 diabetes mellitus without complications: Secondary | ICD-10-CM

## 2015-07-05 DIAGNOSIS — I10 Essential (primary) hypertension: Secondary | ICD-10-CM | POA: Diagnosis not present

## 2015-07-05 LAB — POCT GLYCOSYLATED HEMOGLOBIN (HGB A1C): HEMOGLOBIN A1C: 7.4

## 2015-07-05 NOTE — Progress Notes (Signed)
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  BP 161/09 mmHg  Pulse 71  Temp(Src) 97.9 F (36.6 C) (Oral)  Resp 16  Ht  (1.702 m)  Wt 208 lb (94.348 kg)  BMI 32.57 kg/m2  SpO2 98% Heart regular without murmur/no carotid bruits/no sensory losses over feet  Impression Type 2 diabetes mellitus without complication - Plan: POCT glycosylated hemoglobin (Hb A1C)  Essential hypertension  Results for orders placed or performed in visit on 07/05/15  POCT glycosylated hemoglobin (Hb A1C)  Result Value Ref Range   Hemoglobin A1C 7.4    this represents improvement even sit 6 weeks ago  He will continue metformin and glipizide for now with planned recheck in 6 months. He will continue his efforts at weight loss. His blood pressure on the outside is significantly better than this he says.   Current outpatient prescriptions:  .  benazepril-hydrochlorthiazide (LOTENSIN HCT) 20-12.5 MG per tablet, Take 2 tablets by mouth daily., Disp: 180 tablet, Rfl: 3 .  glipiZIDE (GLUCOTROL) 10 MG tablet, Take 1 tablet (10 mg total) by mouth 2 (two) times daily before a meal., Disp: 180 tablet, Rfl: 3 .  metFORMIN (GLUCOPHAGE) 850 MG tablet, Take 1 tablet (850 mg total) by mouth 3 (three) times daily., Disp: 270 tablet, Rfl: 3 .  verapamil (VERELAN PM) 360 MG 24 hr capsule, Take 1 capsule (360 mg total) by mouth every morning., Disp: 90 capsule, Rfl: 3 He does not need refills at this point and will follow-up in March 2017

## 2015-07-11 NOTE — Telephone Encounter (Signed)
No note

## 2015-09-25 ENCOUNTER — Encounter: Payer: Self-pay | Admitting: Internal Medicine

## 2016-01-03 ENCOUNTER — Ambulatory Visit (INDEPENDENT_AMBULATORY_CARE_PROVIDER_SITE_OTHER): Payer: Medicare Other | Admitting: Internal Medicine

## 2016-01-03 ENCOUNTER — Encounter: Payer: Self-pay | Admitting: Internal Medicine

## 2016-01-03 VITALS — BP 162/91 | HR 59 | Temp 98.5°F | Resp 16 | Ht 67.0 in | Wt 209.0 lb

## 2016-01-03 DIAGNOSIS — Z0000 Encounter for general adult medical examination without abnormal findings: Secondary | ICD-10-CM | POA: Diagnosis not present

## 2016-01-03 DIAGNOSIS — E119 Type 2 diabetes mellitus without complications: Secondary | ICD-10-CM | POA: Diagnosis not present

## 2016-01-03 DIAGNOSIS — E785 Hyperlipidemia, unspecified: Secondary | ICD-10-CM

## 2016-01-03 DIAGNOSIS — R351 Nocturia: Secondary | ICD-10-CM | POA: Diagnosis not present

## 2016-01-03 DIAGNOSIS — I10 Essential (primary) hypertension: Secondary | ICD-10-CM | POA: Diagnosis not present

## 2016-01-03 DIAGNOSIS — Z125 Encounter for screening for malignant neoplasm of prostate: Secondary | ICD-10-CM | POA: Diagnosis not present

## 2016-01-03 DIAGNOSIS — N401 Enlarged prostate with lower urinary tract symptoms: Secondary | ICD-10-CM

## 2016-01-03 DIAGNOSIS — Z1211 Encounter for screening for malignant neoplasm of colon: Secondary | ICD-10-CM | POA: Diagnosis not present

## 2016-01-03 DIAGNOSIS — Z6835 Body mass index (BMI) 35.0-35.9, adult: Secondary | ICD-10-CM | POA: Diagnosis not present

## 2016-01-03 LAB — POCT GLYCOSYLATED HEMOGLOBIN (HGB A1C): HEMOGLOBIN A1C: 8.6

## 2016-01-03 MED ORDER — BENAZEPRIL-HYDROCHLOROTHIAZIDE 20-12.5 MG PO TABS: 2.0000 | ORAL_TABLET | Freq: Every day | ORAL | Status: DC

## 2016-01-03 MED ORDER — CILIDINIUM-CHLORDIAZEPOXIDE 2.5-5 MG PO CAPS: 1.0000 | ORAL_CAPSULE | Freq: Every day | ORAL | Status: DC | PRN

## 2016-01-03 MED ORDER — VERAPAMIL HCL ER 360 MG PO CP24: 360.0000 mg | ORAL_CAPSULE | Freq: Every morning | ORAL | Status: DC

## 2016-01-03 NOTE — Progress Notes (Signed)
Subjective:    Patient ID: Kevin Weaver, male    DOB: 24-Jul-1941, 75 y.o.   MRN: [id number]  HPIannual medicare plus f/u DM/HTN Patient Active Problem List   Diagnosis Date Noted  . HTN (hypertension) 03/04/2012    Priority: High  . DM (diabetes mellitus) (HCC) 03/04/2012    Priority: Medium  . BMI 35.0-35.9,adult 03/04/2012    Priority: Medium  . Nonspecific abnormal finding in stool contents 02/24/2014  . Rupture of left quadriceps tendon 08/03/2013  . Hyperlipemia 03/04/2012  . 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 Chronic shoulder pain 03/04/2012  . History of GI bleed 03/04/2012  --   history of IBS  Outpatient Encounter Prescriptions as of 01/03/2016  Medication Sig  . benazepril-hydrochlorthiazide (LOTENSIN HCT) 20-12.5 MG tablet Take 2 tablets by mouth daily.  Marland Kitchen glipiZIDE (GLUCOTROL) 10 MG tablet Take 1 tablet (10 mg total) by mouth 2 (two) times daily before a meal.  . metFORMIN (GLUCOPHAGE) 850 MG tablet Take 1 tablet (850 mg total) by mouth 3 (three) times daily.  . verapamil (VERELAN PM) 360 MG 24 hr capsule Take 1 capsule (360 mg total) by mouth every morning.  . clidinium-chlordiazePOXIDE (LIBRAX) 5-2.5 MG capsule Take 1 capsule by mouth daily as needed.He requires this 1-2 times a month when he has lower abdominal cramping of unclear etiology. He has responded to this med for many years . During that time he has continued to refuse colonoscopy. He had an initial colonoscopy in the 1990s.    No facility-administered encounter medications on file as of 01/03/2016.  Random home blood pressures only. Random home glucose only.  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 His boss does not allow him to do any physical lifting and restricts his activity driving only. He has managed to continue to get a DOT card. See last exam here.  He has had little dietary control recently and binges on zero bars.  Health maintenance issues. He refuses  vaccines colonoscopy and flu vaccine and Zostavax and pneumonia vaccine.  Review of Systems He completed a 14 point review of systems which was positive only for his joint difficulties which are ongoing. He has had an amputation one hand. He had a prior serious break of the right ankle which healed deformity.     Objective:   Physical Exam  Constitutional: He is oriented to person, place, and time. He appears well-developed and well-nourished.  Overweight  HENT:  Head: Normocephalic and atraumatic.  Right Ear: Hearing, tympanic membrane, external ear and ear canal normal.  Left Ear: Hearing, tympanic membrane, external ear and ear canal normal.  Nose: Nose normal.  Mouth/Throat: Uvula is midline, oropharynx is clear and moist and mucous membranes are normal.  Eyes: Conjunctivae, EOM and lids are normal. Pupils are equal, round, and reactive to light. Right eye exhibits no discharge. Left eye exhibits no discharge. No scleral icterus.  Neck: Trachea normal and normal range of motion. Neck supple. Carotid bruit is not present. No thyromegaly present.  Cardiovascular: Normal rate, regular rhythm, normal heart sounds, intact distal pulses and normal pulses.   No murmur heard. Pulmonary/Chest: Effort normal and breath sounds normal. No respiratory distress. He has no wheezes. He has no rhonchi. He has no rales.  Abdominal: Soft. Normal appearance and bowel sounds are normal. He exhibits no abdominal bruit. There is no tenderness.  Musculoskeletal: Normal range of motion. He exhibits no edema or tenderness.  Deformed R  ankle Nail fungus  Lymphadenopathy:       Head (right side): No submental, no submandibular, no tonsillar, no preauricular, no posterior auricular and no occipital adenopathy present.       Head (left side): No submental, no submandibular, no tonsillar, no preauricular, no posterior auricular and no occipital adenopathy present.    He has no cervical adenopathy.  Neurological: He  is alert and oriented to person, place, and time. He has normal strength and normal reflexes. No cranial nerve deficit or sensory deficit. Coordination and gait normal.  Skin: Skin is warm, dry and intact. No lesion and no rash noted.  Psychiatric: He has a normal mood and affect. His speech is normal and behavior is normal. Judgment and thought content normal.  BP 162/91 mmHg  Pulse 59  Temp(Src) 98.5 F (36.9 C)  Resp 16  Ht  (1.702 m)  Wt 209 lb (94.802 kg)  BMI 32.73 kg/m2   Lab Results  Component Value Date   HGBA1C 8.6 01/03/2016         Assessment & Plan:  Medicare annual wellness visit, subsequent  Essential hypertension - Plan: benazepril-hydrochlorthiazide (LOTENSIN HCT) 20-12.5 MG tablet, Comprehensive metabolic panel -He does not want to increase his dose of medication this point -to start home bp monitor and send results  Type 2 diabetes mellitus without complication, without long-term current use of insulin (HCC) --A1c has increased from mid sevens - Plan: No medicine change for now as he is opposed to starting insulin and cannot afford the other medicines -He was able to reduce his A1c weight loss in the past /will attempt this again  BMI 35.0-35.9,adult  Hyperlipemia - Plan: Lipid panel//he has had adverse effects to statins /will not take them  Screening for prostate cancer-(nocturia 2 or 3)  Special screening for malignant neoplasms, colon - Plan: POC Hemoccult Bld/Stl (3-Cd Home Screen)  IBS--occasional, responding to Librax  [place],asheboromaybe alternatives for referral for primary care when I retire--lives in [place]   Addend labs Results for orders placed or performed in visit on 01/03/16  Comprehensive metabolic panel  Result Value Ref Range   Sodium 139 135 - 146 mmol/L   Potassium 4.5 3.5 - 5.3 mmol/L   Chloride 102 98 - 110 mmol/L   CO2 26 20 - 31 mmol/L   Glucose, Bld 145 (H) 65 - 99 mg/dL   BUN 20 7 - 25 mg/dL   Creat 1.61  0.96 - 0.45 mg/dL   Total Bilirubin 0.6 0.2 - 1.2 mg/dL   Alkaline Phosphatase 69 40 - 115 U/L   AST 19 10 - 35 U/L   ALT 18 9 - 46 U/L   Total Protein 6.8 6.1 - 8.1 g/dL   Albumin 4.1 3.6 - 5.1 g/dL   Calcium 9.4 8.6 - 40.9 mg/dL  Lipid panel  Result Value Ref Range   Cholesterol 154 125 - 200 mg/dL   Triglycerides 811 <914 mg/dL   HDL 41 >=78 mg/dL   Total CHOL/HDL Ratio 3.8 <=5.0 Ratio   VLDL 20 <30 mg/dL   LDL Cholesterol 93 <295 mg/dL  PSA  Result Value Ref Range   PSA 0.79 <=4.00 ng/mL  POCT glycosylated hemoglobin (Hb A1C)  Result Value Ref Range   Hemoglobin A1C 8.6    For a new medical [practice I would suggest [facility] in [place] [phone], or [facility] [phone]. Go ahead and call for a new patient appointment and let me know if there are  any problems. I can send records to them when needed.

## 2016-01-03 NOTE — Patient Instructions (Signed)

## 2016-01-04 LAB — COMPREHENSIVE METABOLIC PANEL
ALK PHOS: 69 U/L (ref 40–115)
ALT: 18 U/L (ref 9–46)
AST: 19 U/L (ref 10–35)
Albumin: 4.1 g/dL (ref 3.6–5.1)
BUN: 20 mg/dL (ref 7–25)
CHLORIDE: 102 mmol/L (ref 98–110)
CO2: 26 mmol/L (ref 20–31)
Calcium: 9.4 mg/dL (ref 8.6–10.3)
Creat: 1.05 mg/dL (ref 0.70–1.18)
Glucose, Bld: 145 mg/dL — ABNORMAL HIGH (ref 65–99)
Potassium: 4.5 mmol/L (ref 3.5–5.3)
Sodium: 139 mmol/L (ref 135–146)
Total Bilirubin: 0.6 mg/dL (ref 0.2–1.2)
Total Protein: 6.8 g/dL (ref 6.1–8.1)

## 2016-01-04 LAB — LIPID PANEL
CHOL/HDL RATIO: 3.8 ratio (ref <=5.0)
Cholesterol: 154 mg/dL (ref 125–200)
HDL: 41 mg/dL (ref >=40)
LDL CALC: 93 mg/dL (ref <130)
Triglycerides: 101 mg/dL (ref <150)
VLDL: 20 mg/dL (ref <30)

## 2016-01-04 LAB — PSA: PSA: 0.79 ng/mL (ref <=4.00)

## 2016-01-06 ENCOUNTER — Encounter: Payer: Self-pay | Admitting: Internal Medicine

## 2016-01-12 NOTE — Addendum Note (Signed)
Addended by: Lucia GaskinsHAAS, Candus Braud C on: 01/12/2016 07:38 AM   Modules accepted: Kipp BroodSmartSet

## 2016-02-21 DIAGNOSIS — E119 Type 2 diabetes mellitus without complications: Secondary | ICD-10-CM | POA: Diagnosis not present

## 2016-02-21 DIAGNOSIS — H02834 Dermatochalasis of left upper eyelid: Secondary | ICD-10-CM | POA: Diagnosis not present

## 2016-02-21 DIAGNOSIS — H2513 Age-related nuclear cataract, bilateral: Secondary | ICD-10-CM | POA: Diagnosis not present

## 2016-02-21 DIAGNOSIS — H02831 Dermatochalasis of right upper eyelid: Secondary | ICD-10-CM | POA: Diagnosis not present

## 2016-02-21 LAB — HM DIABETES EYE EXAM

## 2016-03-01 DIAGNOSIS — R809 Proteinuria, unspecified: Secondary | ICD-10-CM | POA: Diagnosis not present

## 2016-03-01 DIAGNOSIS — E785 Hyperlipidemia, unspecified: Secondary | ICD-10-CM | POA: Diagnosis not present

## 2016-03-01 DIAGNOSIS — E1129 Type 2 diabetes mellitus with other diabetic kidney complication: Secondary | ICD-10-CM | POA: Diagnosis not present

## 2016-03-01 DIAGNOSIS — I119 Hypertensive heart disease without heart failure: Secondary | ICD-10-CM | POA: Diagnosis not present

## 2016-06-05 DIAGNOSIS — E1129 Type 2 diabetes mellitus with other diabetic kidney complication: Secondary | ICD-10-CM | POA: Diagnosis not present

## 2016-06-12 DIAGNOSIS — E1129 Type 2 diabetes mellitus with other diabetic kidney complication: Secondary | ICD-10-CM | POA: Diagnosis not present

## 2016-06-12 DIAGNOSIS — I119 Hypertensive heart disease without heart failure: Secondary | ICD-10-CM | POA: Diagnosis not present

## 2016-06-12 DIAGNOSIS — Z0000 Encounter for general adult medical examination without abnormal findings: Secondary | ICD-10-CM | POA: Diagnosis not present

## 2016-06-12 DIAGNOSIS — E785 Hyperlipidemia, unspecified: Secondary | ICD-10-CM | POA: Diagnosis not present

## 2016-06-12 DIAGNOSIS — R809 Proteinuria, unspecified: Secondary | ICD-10-CM | POA: Diagnosis not present

## 2016-08-25 ENCOUNTER — Encounter (HOSPITAL_COMMUNITY): Payer: Self-pay

## 2016-08-25 ENCOUNTER — Emergency Department (HOSPITAL_COMMUNITY)
Admission: EM | Admit: 2016-08-25 | Discharge: 2016-08-25 | Disposition: A | Discharge status: Home / Self Care | Payer: Medicare Other | Attending: Emergency Medicine | Admitting: Emergency Medicine

## 2016-08-25 ENCOUNTER — Emergency Department (HOSPITAL_COMMUNITY): Payer: Medicare Other

## 2016-08-25 DIAGNOSIS — R109 Unspecified abdominal pain: Secondary | ICD-10-CM | POA: Diagnosis present

## 2016-08-25 DIAGNOSIS — E119 Type 2 diabetes mellitus without complications: Secondary | ICD-10-CM | POA: Insufficient documentation

## 2016-08-25 DIAGNOSIS — Z7722 Contact with and (suspected) exposure to environmental tobacco smoke (acute) (chronic): Secondary | ICD-10-CM | POA: Diagnosis not present

## 2016-08-25 DIAGNOSIS — K5900 Constipation, unspecified: Secondary | ICD-10-CM | POA: Diagnosis not present

## 2016-08-25 DIAGNOSIS — I10 Essential (primary) hypertension: Secondary | ICD-10-CM | POA: Diagnosis not present

## 2016-08-25 DIAGNOSIS — K2970 Gastritis, unspecified, without bleeding: Secondary | ICD-10-CM | POA: Diagnosis not present

## 2016-08-25 DIAGNOSIS — Z7984 Long term (current) use of oral hypoglycemic drugs: Secondary | ICD-10-CM | POA: Diagnosis not present

## 2016-08-25 LAB — COMPREHENSIVE METABOLIC PANEL
ALBUMIN: 3.6 g/dL (ref 3.5–5.0)
ALT: 19 U/L (ref 17–63)
ANION GAP: 10 (ref 5–15)
AST: 18 U/L (ref 15–41)
Alkaline Phosphatase: 92 U/L (ref 38–126)
BILIRUBIN TOTAL: 1 mg/dL (ref 0.3–1.2)
BUN: 8 mg/dL (ref 6–20)
CALCIUM: 9.4 mg/dL (ref 8.9–10.3)
CO2: 26 mmol/L (ref 22–32)
Chloride: 97 mmol/L — ABNORMAL LOW (ref 101–111)
Creatinine, Ser: 0.81 mg/dL (ref 0.61–1.24)
Glucose, Bld: 219 mg/dL — ABNORMAL HIGH (ref 65–99)
Potassium: 3.1 mmol/L — ABNORMAL LOW (ref 3.5–5.1)
SODIUM: 133 mmol/L — AB (ref 135–145)
TOTAL PROTEIN: 7 g/dL (ref 6.5–8.1)

## 2016-08-25 LAB — CBC
HCT: 43.6 % (ref 39.0–52.0)
HEMOGLOBIN: 15.1 g/dL (ref 13.0–17.0)
MCH: 31.2 pg (ref 26.0–34.0)
MCHC: 34.6 g/dL (ref 30.0–36.0)
MCV: 90.1 fL (ref 78.0–100.0)
PLATELETS: 269 10*3/uL (ref 150–400)
RBC: 4.84 MIL/uL (ref 4.22–5.81)
RDW: 12.8 % (ref 11.5–15.5)
WBC: 7.1 10*3/uL (ref 4.0–10.5)

## 2016-08-25 LAB — URINALYSIS, ROUTINE W REFLEX MICROSCOPIC
BILIRUBIN URINE: NEGATIVE
Glucose, UA: 250 mg/dL — AB
Hgb urine dipstick: NEGATIVE
Ketones, ur: NEGATIVE mg/dL
Leukocytes, UA: NEGATIVE
NITRITE: NEGATIVE
PROTEIN: NEGATIVE mg/dL
SPECIFIC GRAVITY, URINE: 1.015 (ref 1.005–1.030)
pH: 8 (ref 5.0–8.0)

## 2016-08-25 LAB — TROPONIN I

## 2016-08-25 LAB — LIPASE, BLOOD: LIPASE: 24 U/L (ref 11–51)

## 2016-08-25 MED ORDER — ONDANSETRON HCL 4 MG/2ML IJ SOLN
4.0000 mg | Freq: Once | INTRAMUSCULAR | Status: AC
  Administered 2016-08-25: 4 mg via INTRAVENOUS
  Filled 2016-08-25: qty 2

## 2016-08-25 MED ORDER — IOPAMIDOL (ISOVUE-300) INJECTION 61%
INTRAVENOUS | Status: AC
  Administered 2016-08-25: 100 mL
  Filled 2016-08-25: qty 100

## 2016-08-25 MED ORDER — GI COCKTAIL ~~LOC~~
30.0000 mL | Freq: Once | ORAL | Status: AC
  Administered 2016-08-25: 30 mL via ORAL
  Filled 2016-08-25: qty 30

## 2016-08-25 MED ORDER — SODIUM CHLORIDE 0.9 % IV BOLUS (SEPSIS)
1000.0000 mL | Freq: Once | INTRAVENOUS | Status: AC
  Administered 2016-08-25: 1000 mL via INTRAVENOUS

## 2016-08-25 MED ORDER — FAMOTIDINE IN NACL 20-0.9 MG/50ML-% IV SOLN
20.0000 mg | Freq: Once | INTRAVENOUS | Status: AC
  Administered 2016-08-25: 20 mg via INTRAVENOUS
  Filled 2016-08-25: qty 50

## 2016-08-25 NOTE — ED Notes (Signed)
Patient returned from CT

## 2016-08-25 NOTE — ED Notes (Signed)
PA at bedside.

## 2016-08-25 NOTE — ED Notes (Signed)
Patient Alert and oriented X4. Stable and ambulatory. Patient verbalized understanding of the discharge instructions.  Patient belongings were taken by the patient.  
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
## 2016-08-25 NOTE — ED Notes (Signed)
Patient taken to CT.

## 2016-08-25 NOTE — ED Triage Notes (Signed)
Pt complaining of generalized abdominal pain. Pt complaining of nausea, but no emesis. Pt denies any diarrhea. Pt states recent constipation, took prune juice, LBM today. Pt denies any urinary complaints.

## 2016-08-25 NOTE — Discharge Instructions (Signed)
Take Omeprazole (Prilosec) over the counter once a day. Please follow up with your family doctor Return for worsening symptoms or blood in stool

## 2016-09-05 DIAGNOSIS — E1129 Type 2 diabetes mellitus with other diabetic kidney complication: Secondary | ICD-10-CM | POA: Diagnosis not present

## 2016-09-05 DIAGNOSIS — E785 Hyperlipidemia, unspecified: Secondary | ICD-10-CM | POA: Diagnosis not present

## 2016-09-12 DIAGNOSIS — R809 Proteinuria, unspecified: Secondary | ICD-10-CM | POA: Diagnosis not present

## 2016-09-12 DIAGNOSIS — E785 Hyperlipidemia, unspecified: Secondary | ICD-10-CM | POA: Diagnosis not present

## 2016-09-12 DIAGNOSIS — I119 Hypertensive heart disease without heart failure: Secondary | ICD-10-CM | POA: Diagnosis not present

## 2016-09-12 DIAGNOSIS — E1129 Type 2 diabetes mellitus with other diabetic kidney complication: Secondary | ICD-10-CM | POA: Diagnosis not present

## 2016-09-18 ENCOUNTER — Ambulatory Visit (INDEPENDENT_AMBULATORY_CARE_PROVIDER_SITE_OTHER): Payer: Medicare Other | Admitting: Orthopaedic Surgery

## 2016-09-18 DIAGNOSIS — S68411S Complete traumatic amputation of right hand at wrist level, sequela: Secondary | ICD-10-CM
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
## 2016-12-06 DIAGNOSIS — E1129 Type 2 diabetes mellitus with other diabetic kidney complication: Secondary | ICD-10-CM | POA: Diagnosis not present

## 2016-12-16 DIAGNOSIS — E1129 Type 2 diabetes mellitus with other diabetic kidney complication: Secondary | ICD-10-CM | POA: Diagnosis not present

## 2016-12-16 DIAGNOSIS — I119 Hypertensive heart disease without heart failure: Secondary | ICD-10-CM | POA: Diagnosis not present

## 2016-12-16 DIAGNOSIS — K219 Gastro-esophageal reflux disease without esophagitis: Secondary | ICD-10-CM | POA: Diagnosis not present

## 2016-12-16 DIAGNOSIS — R809 Proteinuria, unspecified: Secondary | ICD-10-CM | POA: Diagnosis not present

## 2017-02-26 DIAGNOSIS — H02831 Dermatochalasis of right upper eyelid: Secondary | ICD-10-CM | POA: Diagnosis not present

## 2017-02-26 DIAGNOSIS — E119 Type 2 diabetes mellitus without complications: Secondary | ICD-10-CM | POA: Diagnosis not present

## 2017-02-26 DIAGNOSIS — H2513 Age-related nuclear cataract, bilateral: Secondary | ICD-10-CM | POA: Diagnosis not present

## 2017-02-26 DIAGNOSIS — H02834 Dermatochalasis of left upper eyelid: Secondary | ICD-10-CM | POA: Diagnosis not present

## 2017-05-28 DIAGNOSIS — H02833 Dermatochalasis of right eye, unspecified eyelid: Secondary | ICD-10-CM | POA: Insufficient documentation

## 2017-05-28 DIAGNOSIS — H524 Presbyopia: Secondary | ICD-10-CM | POA: Insufficient documentation

## 2017-05-28 DIAGNOSIS — H269 Unspecified cataract: Secondary | ICD-10-CM | POA: Insufficient documentation

## 2017-05-28 DIAGNOSIS — H02836 Dermatochalasis of left eye, unspecified eyelid: Principal | ICD-10-CM

## 2017-05-28 DIAGNOSIS — H259 Unspecified age-related cataract: Secondary | ICD-10-CM

## 2017-05-29 LAB — HM DIABETES EYE EXAM

## 2017-07-02 DIAGNOSIS — E1129 Type 2 diabetes mellitus with other diabetic kidney complication: Secondary | ICD-10-CM | POA: Diagnosis not present

## 2017-07-09 DIAGNOSIS — Z0000 Encounter for general adult medical examination without abnormal findings: Secondary | ICD-10-CM | POA: Diagnosis not present

## 2017-07-09 DIAGNOSIS — I119 Hypertensive heart disease without heart failure: Secondary | ICD-10-CM | POA: Diagnosis not present

## 2017-07-09 DIAGNOSIS — E785 Hyperlipidemia, unspecified: Secondary | ICD-10-CM | POA: Diagnosis not present

## 2017-07-09 DIAGNOSIS — R809 Proteinuria, unspecified: Secondary | ICD-10-CM | POA: Diagnosis not present

## 2017-07-09 DIAGNOSIS — E1129 Type 2 diabetes mellitus with other diabetic kidney complication: Secondary | ICD-10-CM | POA: Diagnosis not present

## 2017-08-16 IMAGING — CT CT ABD-PELV W/ CM
2 of 5 series · 16 of 46 positions shown, 18 images · IV contrast (Omni 300)
Comparison: None.

CLINICAL DATA: Acute onset of generalized abdominal pain and
nausea. Constipation. Initial encounter.

EXAM:
CT ABDOMEN AND PELVIS WITH CONTRAST
TECHNIQUE: Multidetector CT imaging of the abdomen and pelvis was performed
using the standard protocol following bolus administration of
intravenous contrast.
CONTRAST:  100mL MWLQPW-Z99 IOPAMIDOL (MWLQPW-Z99) INJECTION 61%

[Series 2: a/p w/ 5mm · axial · 0.78mm/px · z∈[-1051,-631]mm · 13 of 96 slices shown, 15 images]
[im 6/96  soft-tissue]
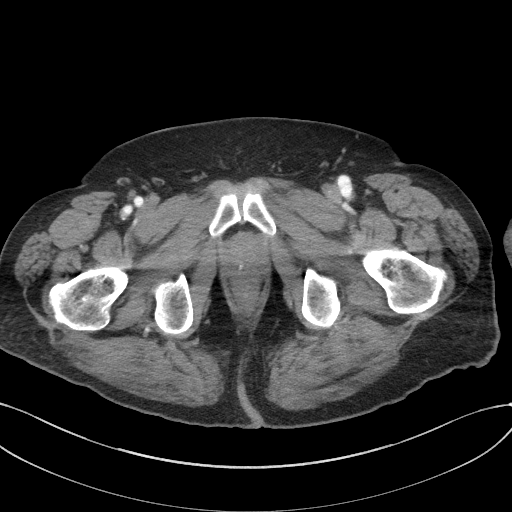
[im 6/96  bone]
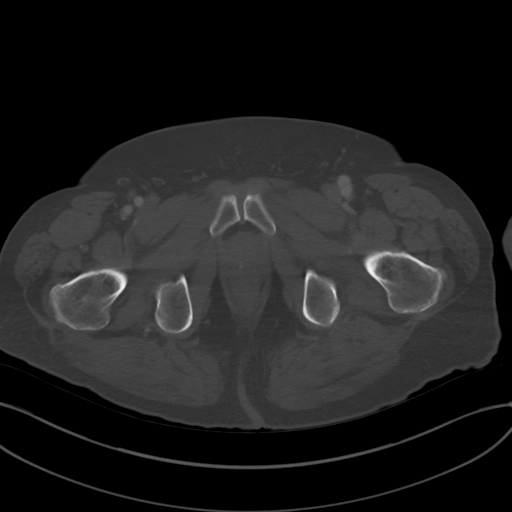
[im 11/96  soft-tissue]
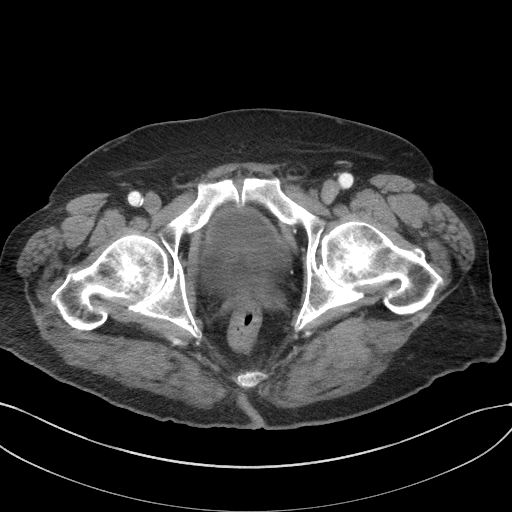
[im 22/96  soft-tissue]
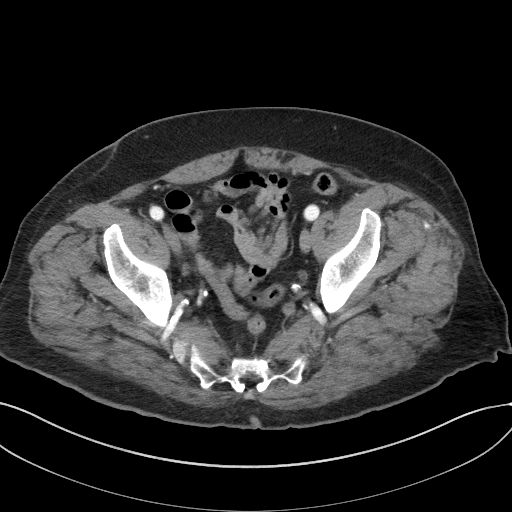
[im 27/96  soft-tissue]
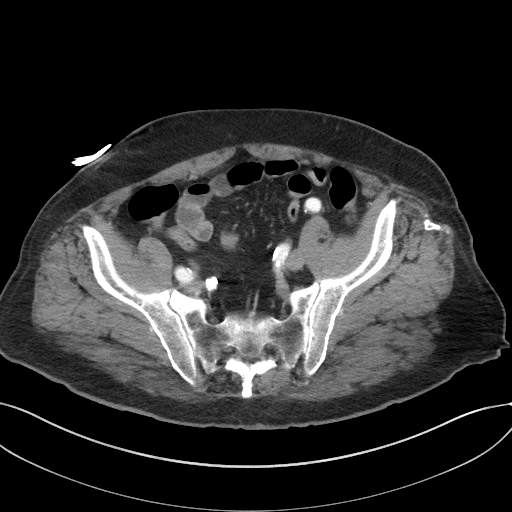
[im 32/96  soft-tissue]
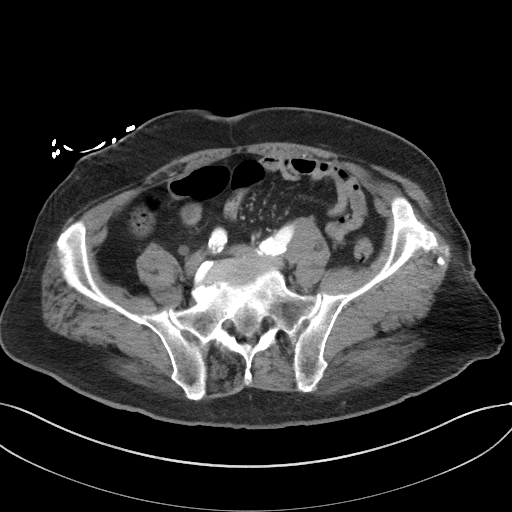
[im 43/96  soft-tissue]
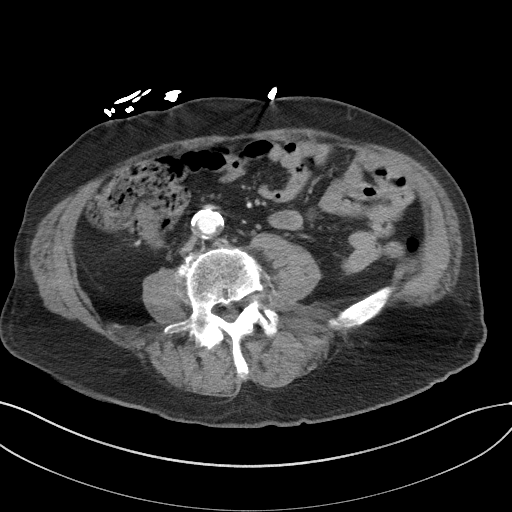
[im 48/96  soft-tissue]
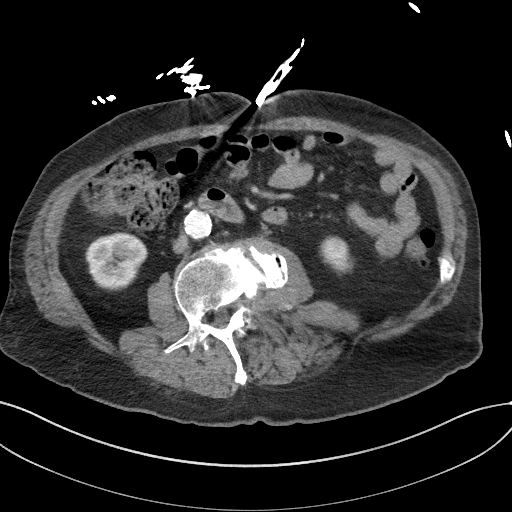
[im 53/96  soft-tissue]
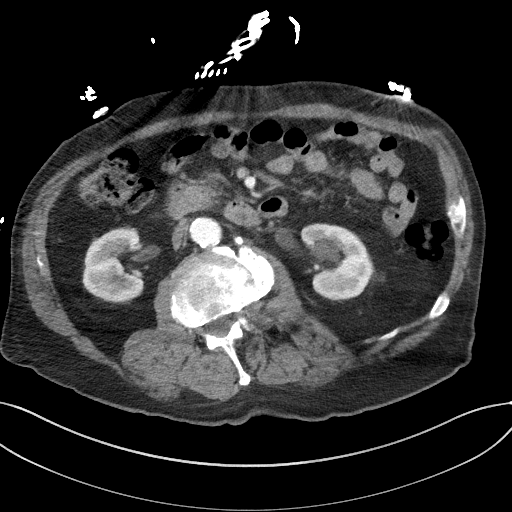
[im 64/96  soft-tissue]
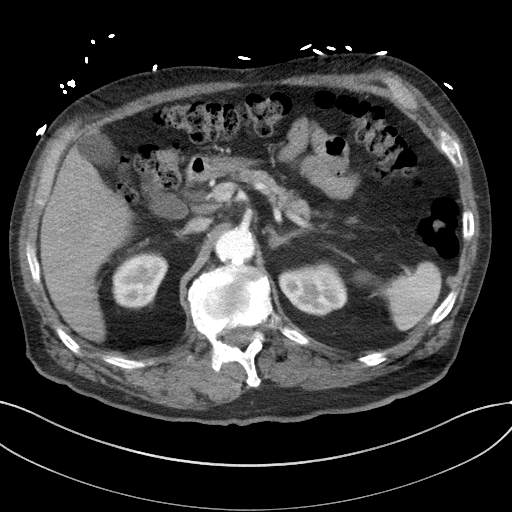
[im 64/96  bone]
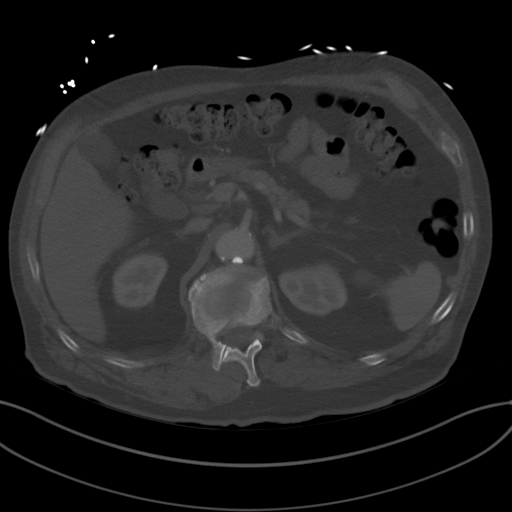
[im 69/96  soft-tissue]
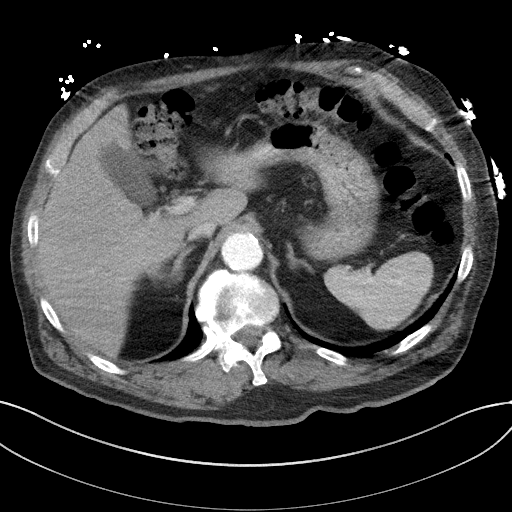
[im 74/96  soft-tissue]
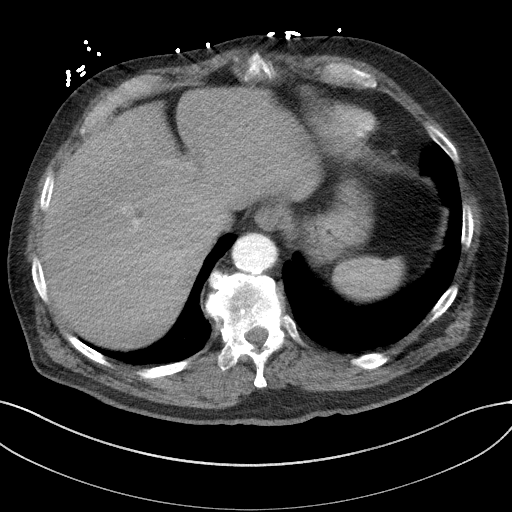
[im 85/96  soft-tissue]
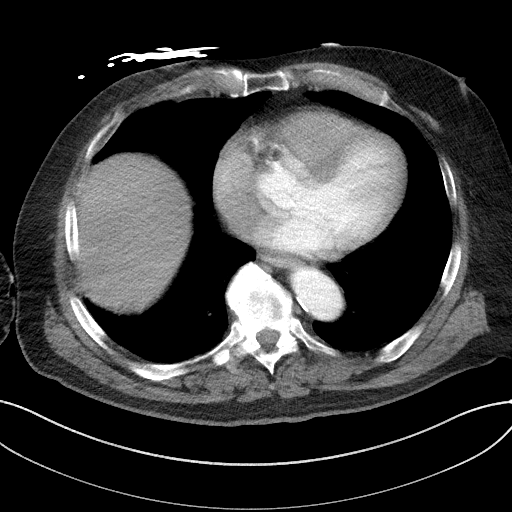
[im 90/96  soft-tissue]
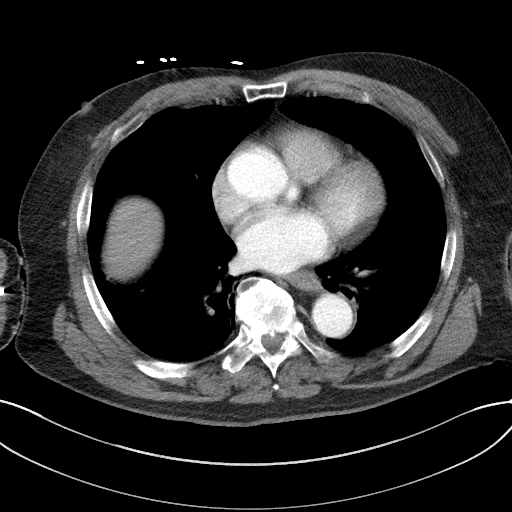

[Series 4: a/p w/ cor · coronal · 0.82mm/px · 3 of 139 slices shown]
[im 47/139  soft-tissue]
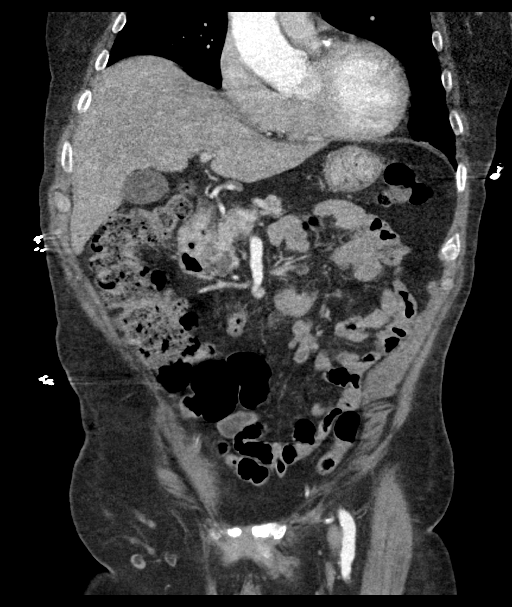
[im 62/139  soft-tissue]
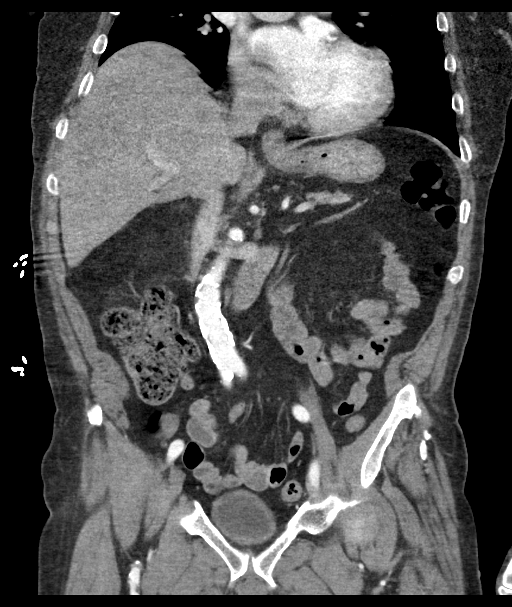
[im 77/139  soft-tissue]
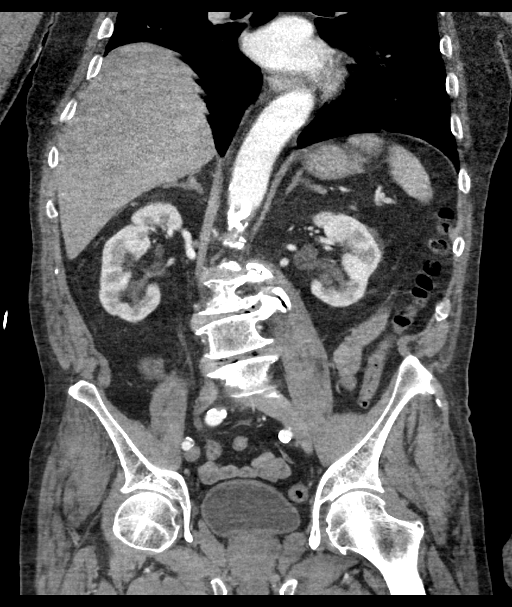

[16 of 46 positions shown; findings below may reference images not displayed]

FINDINGS: Lower chest: The visualized lung bases are grossly clear. The
visualized portions of the mediastinum are unremarkable. Scattered
coronary artery calcifications are seen.

Hepatobiliary: The liver is unremarkable in appearance. The
gallbladder is unremarkable in appearance. The common bile duct
remains normal in caliber.

Pancreas: The pancreas is within normal limits.

Spleen: The spleen is unremarkable in appearance.

Adrenals/Urinary Tract: The adrenal glands are unremarkable in
appearance.

Bilateral renal pelvicaliectasis likely remains within normal
limits. Nonspecific perinephric stranding is noted bilaterally.
There is no evidence of hydronephrosis. No renal or ureteral stones
are identified.

Stomach/Bowel: The stomach is unremarkable in appearance. Mild
gastric varices are suggested.

The small bowel is within normal limits. The appendix is normal in
caliber, without evidence of appendicitis. The colon is unremarkable
in appearance.

Vascular/Lymphatic: Scattered calcification is seen along the
abdominal aorta and its branches. The abdominal aorta is otherwise
grossly unremarkable. The inferior vena cava is grossly
unremarkable. No retroperitoneal lymphadenopathy is seen. No pelvic
sidewall lymphadenopathy is identified.

Reproductive: The bladder is mildly distended and grossly
unremarkable. The prostate remains normal in size.

Other: No additional soft tissue abnormalities are seen.

Musculoskeletal: No acute osseous abnormalities are identified. Mild
right convex lumbar scoliosis is noted, with mild underlying vacuum
phenomenon. The visualized musculature is unremarkable in
appearance.
IMPRESSION: 1. No acute abnormality seen within the abdomen or pelvis.
2. Scattered aortic atherosclerosis noted.
3. Suggestion of mild gastric varices.
4. Scattered coronary artery calcifications seen.
5. Mild right convex lumbar scoliosis.

## 2017-11-05 DIAGNOSIS — H02831 Dermatochalasis of right upper eyelid: Secondary | ICD-10-CM | POA: Diagnosis not present

## 2017-11-05 DIAGNOSIS — H02834 Dermatochalasis of left upper eyelid: Secondary | ICD-10-CM | POA: Diagnosis not present

## 2017-11-05 DIAGNOSIS — H2513 Age-related nuclear cataract, bilateral: Secondary | ICD-10-CM | POA: Diagnosis not present

## 2017-11-05 DIAGNOSIS — E113293 Type 2 diabetes mellitus with mild nonproliferative diabetic retinopathy without macular edema, bilateral: Secondary | ICD-10-CM | POA: Diagnosis not present

## 2017-11-05 LAB — HM DIABETES EYE EXAM

## 2017-12-26 DIAGNOSIS — E1129 Type 2 diabetes mellitus with other diabetic kidney complication: Secondary | ICD-10-CM | POA: Diagnosis not present

## 2018-01-01 DIAGNOSIS — E1129 Type 2 diabetes mellitus with other diabetic kidney complication: Secondary | ICD-10-CM | POA: Diagnosis not present

## 2018-01-01 DIAGNOSIS — K219 Gastro-esophageal reflux disease without esophagitis: Secondary | ICD-10-CM | POA: Diagnosis not present

## 2018-01-01 DIAGNOSIS — R809 Proteinuria, unspecified: Secondary | ICD-10-CM | POA: Diagnosis not present

## 2018-01-01 DIAGNOSIS — I119 Hypertensive heart disease without heart failure: Secondary | ICD-10-CM | POA: Diagnosis not present

## 2018-07-10 ENCOUNTER — Other Ambulatory Visit: Payer: Self-pay

## 2018-07-10 ENCOUNTER — Ambulatory Visit (INDEPENDENT_AMBULATORY_CARE_PROVIDER_SITE_OTHER): Payer: Medicare Other | Admitting: Family Medicine

## 2018-07-10 ENCOUNTER — Ambulatory Visit (INDEPENDENT_AMBULATORY_CARE_PROVIDER_SITE_OTHER): Payer: Medicare Other

## 2018-07-10 ENCOUNTER — Encounter: Payer: Self-pay | Admitting: Urgent Care

## 2018-07-10 VITALS — BP 119/77 | HR 74 | Temp 98.7°F | Resp 17 | Ht 69.0 in | Wt 218.2 lb

## 2018-07-10 DIAGNOSIS — R053 Chronic cough: Secondary | ICD-10-CM

## 2018-07-10 DIAGNOSIS — R05 Cough: Secondary | ICD-10-CM | POA: Diagnosis not present

## 2018-07-10 MED ORDER — FAMOTIDINE 20 MG PO TABS: 20.0000 mg | ORAL_TABLET | Freq: Every day | ORAL | 1 refills | Status: DC

## 2018-07-10 MED ORDER — OMEPRAZOLE 20 MG PO CPDR: 20.0000 mg | DELAYED_RELEASE_CAPSULE | Freq: Every day | ORAL | 3 refills | Status: DC

## 2018-07-10 NOTE — Patient Instructions (Addendum)
If you have lab work done today you will be contacted with your lab results within the next 2 weeks.  If you have not heard from us then please contact us. The fastest way to get your results is to register for My Chart.   IF you received an x-ray today, you will receive an invoice from [facility]. Please contact [facility] at [phone] with questions or concerns regarding your invoice.   IF you received labwork today, you will receive an invoice from [facility]. Please contact [facility] at [phone] with questions or concerns regarding your invoice.   Our billing staff will not be able to assist you with questions regarding bills from these companies.  You will be contacted with the lab results as soon as they are available. The fastest way to get your results is to activate your My Chart account. Instructions are located on the last page of this paperwork. If you have not heard from us regarding the results in 2 weeks, please contact this office.    Allergy Test Why am I having this test? This test helps diagnose specific allergies that are causing different symptoms, such as:  Rashes.  Runny nose.  Sneezing.  Asthma flare-ups.  It is often used when skin testing for allergies is not an option. This test measures your level of immunoglobulin E (IgE). This determines exactly what substance you are allergic to. A common method used to measure IgE is the radioallergosorbent test (RAST) in which specific allergens are tested. You can be tested for some allergens, including:  Animal dander.  Food.  Pollens.  Dusts.  Latex.  Molds.  Insect venoms.  Medicines.  What kind of sample is taken? A blood sample is required for this test. It is usually collected by inserting a needle into a vein. How do I prepare for this test? There is no preparation required for this test. What are the reference ranges? Reference ranges are considered healthy ranges  established after testing a large group of healthy people. Reference ranges may vary among different people, labs, and hospitals. It is your responsibility to obtain your test results. Ask the lab or department performing the test when and how you will get your results. Reference ranges for IgE are listed here:  Adult: 0-100 International Units/mL.  Child 0-23 months: 0-13 International Units/mL.  Child 2-5 years: 0-56 International Units/mL.  Child 6-10 years: 0-85 International Units/mL.  Reference ranges for RAST are listed here:  RAST rating 0: Less than 0.35 KU/L.  RAST rating 1: 0.35-0.69 KU/L.  RAST rating 2: 0.70-3.49 KU/L.  RAST rating 3: 3.50-17.49 KU/L.  RAST rating 4: 17.50-49.99 KU/L.  RAST rating 5: 50-100 KU/L.  RAST rating 6: Greater than 100 KU/L.  What do the results mean? A RAST rating of:  0 means you have an absent or undetectable allergen-specific IgE.  1 means you have a low level of allergen-specific IgE.  2 means you have a moderate level of allergen-specific IgE.  3 means you have a high level of allergen-specific IgE.  4 means you have a very high level of allergen-specific IgE.  5 means you have a very high level of allergen-specific IgE.  6 means you have an extremely high level of allergen-specific IgE.  Talk with your health care provider to discuss your results, treatment options, and if necessary, the need for more tests. Talk with your health care provider if you have any questions about your results. Talk with your  health care provider to discuss your results, treatment options, and if necessary, the need for more tests. Talk with your health care provider if you have any questions about your results. This information is not intended to replace advice given to you by your health care provider. Make sure you discuss any questions you have with your health care provider. Document Released: 11/05/2004 Document Revised: 06/17/2016 Document  Reviewed: 03/18/2014 Elsevier Interactive Patient Education  [employer].
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
## 2018-07-27 ENCOUNTER — Ambulatory Visit (INDEPENDENT_AMBULATORY_CARE_PROVIDER_SITE_OTHER): Payer: Medicare Other

## 2018-07-27 ENCOUNTER — Encounter (INDEPENDENT_AMBULATORY_CARE_PROVIDER_SITE_OTHER): Payer: Self-pay | Admitting: Orthopaedic Surgery

## 2018-07-27 ENCOUNTER — Ambulatory Visit (INDEPENDENT_AMBULATORY_CARE_PROVIDER_SITE_OTHER): Payer: Medicare Other | Admitting: Orthopaedic Surgery

## 2018-07-27 ENCOUNTER — Other Ambulatory Visit (INDEPENDENT_AMBULATORY_CARE_PROVIDER_SITE_OTHER): Payer: Self-pay

## 2018-07-27 DIAGNOSIS — M25511 Pain in right shoulder: Secondary | ICD-10-CM

## 2018-07-27 DIAGNOSIS — G8929 Other chronic pain: Secondary | ICD-10-CM

## 2018-07-27 DIAGNOSIS — M19011 Primary osteoarthritis, right shoulder: Secondary | ICD-10-CM | POA: Diagnosis not present

## 2018-07-27 MED ORDER — TRAMADOL HCL 50 MG PO TABS: 50.0000 mg | ORAL_TABLET | Freq: Four times a day (QID) | ORAL | 0 refills | Status: DC | PRN
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
## 2018-07-30 ENCOUNTER — Ambulatory Visit (INDEPENDENT_AMBULATORY_CARE_PROVIDER_SITE_OTHER): Payer: Self-pay

## 2018-07-30 ENCOUNTER — Encounter (INDEPENDENT_AMBULATORY_CARE_PROVIDER_SITE_OTHER): Payer: Self-pay | Admitting: Physical Medicine and Rehabilitation

## 2018-07-30 ENCOUNTER — Ambulatory Visit (INDEPENDENT_AMBULATORY_CARE_PROVIDER_SITE_OTHER): Payer: Medicare Other | Admitting: Physical Medicine and Rehabilitation

## 2018-07-30 DIAGNOSIS — M25511 Pain in right shoulder: Secondary | ICD-10-CM | POA: Diagnosis not present

## 2018-07-30 DIAGNOSIS — G8929 Other chronic pain: Secondary | ICD-10-CM

## 2018-07-30 NOTE — Progress Notes (Signed)
 .  Numeric Pain Rating Scale and Functional Assessment Average Pain 9   In the last MONTH (on 0-10 scale) has pain interfered with the following?  1. General activity like being  able to carry out your everyday physical activities such as walking, climbing stairs, carrying groceries, or moving a chair?  Rating(7)    -Dye Allergies.  

## 2018-07-30 NOTE — Progress Notes (Signed)
   JOURNEY RATTERMAN - 77 y.o. male MRN [id number]  Date of birth: 05-Oct-1941  Office Visit Note: Visit Date: 07/30/2018 PCP: [facility] Referred by: Physicians, Quin Hoop*  Subjective: Chief Complaint  Patient presents with  . Right Shoulder - Pain   HPI:  EMERSEN CARROLL is a 77 y.o. male who comes in today for planned right glenohumeral joint injection with fluoroscopic guidance for osteoarthritis of the right glenohumeral joint which is failed conservative care.  Dr. Magnus Ivan request diagnostic note for therapeutic injection.  Patient does have significant diabetes and we did go over risk the benefit of cortisone and steroid medication in the blood sugar control.    ROS Otherwise per HPI.  Assessment & Plan: Visit Diagnoses:  1. Chronic right shoulder pain     Plan: No additional findings.   Meds & Orders: No orders of the defined types were placed in this encounter.   Orders Placed This Encounter  Procedures  . Large Joint Inj: R glenohumeral  . XR C-ARM NO REPORT    Follow-up: Return if symptoms worsen or fail to improve, for Dr. Magnus Ivan.   Procedures: Large Joint Inj: R glenohumeral on 07/30/2018 9:25 AM Indications: pain and diagnostic evaluation Details: 22 G 3.5 in needle, fluoroscopy-guided anteromedial approach  Arthrogram: No  Medications: 80 mg triamcinolone acetonide 40 MG/ML; 3 mL bupivacaine 0.5 % Outcome: tolerated well, no immediate complications  There was excellent flow of contrast producing a partial arthrogram of the glenohumeral joint. The patient did have relief of symptoms during the anesthetic phase of the injection. Procedure, treatment alternatives, risks and benefits explained, specific risks discussed. Consent was given by the patient. Immediately prior to procedure a time out was called to verify the correct patient, procedure, equipment, support staff and site/side marked as required. Patient was prepped and draped in the  usual sterile fashion.      No notes on file   Clinical History: No specialty comments available.     Objective:  VS:  HT:    WT:   BMI:     BP:   HR: bpm  TEMP: ( )  RESP:  Physical Exam  Ortho Exam Imaging: No results found.

## 2018-07-30 NOTE — Patient Instructions (Signed)

## 2018-08-06 MED ORDER — TRIAMCINOLONE ACETONIDE 40 MG/ML IJ SUSP
80.0000 mg | INTRAMUSCULAR | Status: AC | PRN
  Administered 2018-07-30: 80 mg via INTRA_ARTICULAR

## 2018-08-06 MED ORDER — BUPIVACAINE HCL 0.5 % IJ SOLN
3.0000 mL | INTRAMUSCULAR | Status: AC | PRN
  Administered 2018-07-30: 3 mL via INTRA_ARTICULAR

## 2018-08-09 ENCOUNTER — Other Ambulatory Visit: Payer: Self-pay | Admitting: Family Medicine

## 2018-08-10 NOTE — Telephone Encounter (Signed)
Requested medication (s) are due for refill today: Yes  Requested medication (s) are on the active medication list: Yes  Last refill:  07/10/18  Future visit scheduled: Yes  Notes to clinic:  Unable to refill per protocol     Requested Prescriptions  Pending Prescriptions Disp Refills   famotidine (PEPCID) 20 MG tablet [Pharmacy Med Name: FAMOTIDINE 20 MG TABLET] 30 tablet 1    Sig: TAKE 1 TABLET BY MOUTH EVERYDAY AT BEDTIME     Gastroenterology:  H2 Antagonists Failed - 08/09/2018 11:32 AM      Failed - Valid encounter within last 12 months    Recent Outpatient Visits          1 month ago Chronic cough   Primary Care at [facility], Manus Rudd, MD   2 years ago Medicare annual wellness visit, subsequent   Primary Care at [facility], Harrel Lemon, MD   3 years ago Type 2 diabetes mellitus without complication   Primary Care at [facility], Harrel Lemon, MD   3 years ago Type 2 diabetes mellitus without complication   Primary Care at [facility], [place], [place]   3 years ago 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

## 2018-08-13 ENCOUNTER — Ambulatory Visit (INDEPENDENT_AMBULATORY_CARE_PROVIDER_SITE_OTHER): Payer: Medicare Other | Admitting: Allergy

## 2018-08-13 ENCOUNTER — Encounter: Payer: Self-pay | Admitting: Allergy

## 2018-08-13 VITALS — BP 122/80 | HR 81 | Temp 97.8°F | Resp 18 | Ht 66.0 in | Wt 211.2 lb

## 2018-08-13 DIAGNOSIS — R05 Cough: Secondary | ICD-10-CM

## 2018-08-13 DIAGNOSIS — R12 Heartburn: Secondary | ICD-10-CM

## 2018-08-13 DIAGNOSIS — J3089 Other allergic rhinitis: Secondary | ICD-10-CM

## 2018-08-13 DIAGNOSIS — J310 Chronic rhinitis: Secondary | ICD-10-CM

## 2018-08-13 DIAGNOSIS — R059 Cough, unspecified: Secondary | ICD-10-CM

## 2018-08-13 MED ORDER — FLUTICASONE PROPIONATE 50 MCG/ACT NA SUSP: 2.0000 | Freq: Every day | NASAL | 6 refills | Status: DC

## 2018-08-13 MED ORDER — AZELASTINE HCL 0.15 % NA SOLN: 2.0000 | Freq: Two times a day (BID) | NASAL | 5 refills | Status: DC

## 2018-08-13 NOTE — Assessment & Plan Note (Signed)
   Continue Pepcid 20mg  daily and prilosec 20mg  daily

## 2018-08-13 NOTE — Patient Instructions (Addendum)
Cough Persistent coughing for the past 2 years but worse in the last month.  Denies any triggering factors per se but happened after consuming 3 bananas however eating bananas daily since then with no issues. Patient is on Pepcid and Prilosec for heartburn.  Chest x-ray on 07/10/2018 was normal. History of chewing tobacco. Discussed with patient that coughing can be multifactorial.  Today spirometry was normal. If coughing is not improved then consider getting full body PFT with lung volumes and DLCO.  Skin testing to banana was negative. May continue to consume as before.   Question if benazepril is contributing to his coughing. Recommend that he talks with PCP about switching to a non-Ace inhibitor antihypertensive medication.  Rest of the plan see below as for allergic rhinitis.  Heartburn  Continue Pepcid 20mg  daily and prilosec 20mg  daily  Other allergic rhinitis Coughing for over 2 years.   Today's skin testing was only positive to molds.  Discussed environmental control measures.  Question how much of this is contributing to his coughing symptoms.  Start Dymista 1 spray twice a day and demonstrated proper use.  Sample given in office.  Monitor symptoms.  Return in about 2 months (around 10/13/2018).  Mold Control . Mold and fungi can grow on a variety of surfaces provided certain temperature and moisture conditions exist.  . Outdoor molds grow on plants, decaying vegetation and soil. The major outdoor mold, Alternaria and Cladosporium, are found in very high numbers during hot and dry conditions. Generally, a late summer - fall peak is seen for common outdoor fungal spores. Rain will temporarily lower outdoor mold spore count, but counts rise rapidly when the rainy period ends. . The most important indoor molds are Aspergillus and Penicillium. Dark, humid and poorly ventilated basements are ideal sites for mold growth. The next most common sites of mold growth are the bathroom and  the kitchen. Indoor (Perennial) Mold Control  . Maintain humidity below 50%. . Get rid of mold growth on hard surfaces with water, detergent and, if necessary, 5% bleach (do not mix with other cleaners). Then dry the area completely. If mold covers an area more than 10 square feet, consider hiring an indoor environmental professional. . For clothing, washing with soap and water is best. If moldy items cannot be cleaned and dried, throw them away. . Remove sources e.g. contaminated carpets. . Repair and seal leaking roofs or pipes. Using dehumidifiers in damp basements may be helpful, but empty the water and clean units regularly to prevent mildew from forming. All rooms, especially basements, bathrooms and kitchens, require ventilation and cleaning to deter mold and mildew growth. Avoid carpeting on concrete or damp floors, and storing items in damp areas.

## 2018-08-13 NOTE — Assessment & Plan Note (Addendum)
Persistent coughing for the past 2 years but worse in the last month.  Denies any triggering factors per se but happened after consuming 3 bananas however eating bananas daily since then with no issues. Patient is on Pepcid and Prilosec for heartburn.  Chest x-ray on 07/10/2018 was normal. History of chewing tobacco. Discussed with patient that coughing can be multifactorial.  Today spirometry was normal. If coughing is not improved then consider getting full body PFT with lung volumes and DLCO.  Skin testing to banana was negative. May continue to consume as before.   Question if benazepril is contributing to his coughing. Recommend that he talks with PCP about switching to a non-Ace inhibitor antihypertensive medication.  Rest of the plan see below as for allergic rhinitis.
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
## 2018-08-13 NOTE — Assessment & Plan Note (Signed)
Coughing for over 2 years.   Today's skin testing was only positive to molds.  Discussed environmental control measures.  Question how much of this is contributing to his coughing symptoms.  Start Dymista 1 spray twice a day and demonstrated proper use.  Sample given in office.  Monitor symptoms.

## 2018-08-22 ENCOUNTER — Other Ambulatory Visit: Payer: Self-pay

## 2018-08-22 ENCOUNTER — Ambulatory Visit (INDEPENDENT_AMBULATORY_CARE_PROVIDER_SITE_OTHER): Payer: Medicare Other | Admitting: Family Medicine

## 2018-08-22 ENCOUNTER — Encounter: Payer: Self-pay | Admitting: Family Medicine

## 2018-08-22 VITALS — BP 110/72 | HR 90 | Temp 98.6°F | Resp 17 | Ht 66.0 in | Wt 210.0 lb

## 2018-08-22 DIAGNOSIS — R05 Cough: Secondary | ICD-10-CM | POA: Diagnosis not present

## 2018-08-22 DIAGNOSIS — R053 Chronic cough: Secondary | ICD-10-CM

## 2018-08-22 DIAGNOSIS — I10 Essential (primary) hypertension: Secondary | ICD-10-CM | POA: Diagnosis not present

## 2018-08-22 DIAGNOSIS — E1169 Type 2 diabetes mellitus with other specified complication: Secondary | ICD-10-CM

## 2018-08-22 LAB — POCT GLYCOSYLATED HEMOGLOBIN (HGB A1C): HEMOGLOBIN A1C: 7.9 % — AB (ref 4.0–5.6)
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
## 2018-08-22 NOTE — Patient Instructions (Addendum)
If you have lab work done today you will be contacted with your lab results within the next 2 weeks.  If you have not heard from [place] then please contact us. The fastest way to get your results is to register for My Chart.   IF you received an x-ray today, you will receive an invoice from [facility]. Please contact [facility] at [phone] with questions or concerns regarding your invoice.   IF you received labwork today, you will receive an invoice from [facility]. Please contact [facility] at [phone] with questions or concerns regarding your invoice.   Our billing staff will not be able to assist you with questions regarding bills from these companies.  You will be contacted with the lab results as soon as they are available. The fastest way to get your results is to activate your My Chart account. Instructions are located on the last page of this paperwork. If you have not heard from [place] regarding the results in 2 weeks, please contact this office.     Preventing Cerebrovascular Disease Arteries are blood vessels that carry blood that contains oxygen from the heart to all parts of the body. Cerebrovascular disease affects arteries that supply the brain. Any condition that blocks or disrupts blood flow to the brain can cause cerebrovascular disease. Brain cells that lose blood supply start to die within minutes (stroke). Stroke is the main danger of cerebrovascular disease. Atherosclerosis and high blood pressure are common causes of cerebrovascular disease. Atherosclerosis is narrowing and hardening of an artery that results when fat, cholesterol, calcium, or other substances (plaque) build up inside an artery. Plaque reduces blood flow through the artery. High blood pressure increases the risk of bleeding inside the brain. Making diet and lifestyle changes to prevent atherosclerosis and high blood pressure lowers your risk of cerebrovascular disease. What nutrition  changes can be made?  Eat more fruits, vegetables, and whole grains.  Reduce how much saturated fat you eat. To do this, eat less red meat and fewer full-fat dairy products.  Eat healthy proteins instead of red meat. Healthy proteins include: ? Fish. Eat fish that contains heart-healthy omega-3 fatty acids, twice a week. Examples include salmon, albacore tuna, mackerel, and herring. ? Chicken. ? Nuts. ? Low-fat or nonfat yogurt.  Avoid processed meats, like bacon and lunchmeat.  Avoid foods that contain: ? A lot of sugar, such as sweets and drinks with added sugar. ? A lot of salt (sodium). Avoid adding extra salt to your food, as told by your health care provider. ? Trans fats, such as margarine and baked goods. Trans fats may be listed as "partially hydrogenated oils" on food labels.  Check food labels to see how much sodium, sugar, and trans fats are in foods.  Use vegetable oils that contain low amounts of saturated fat, such as olive oil or canola oil. What lifestyle changes can be made?  Drink alcohol in moderation. This means no more than 1 drink a day for nonpregnant women and 2 drinks a day for men. One drink equals 12 oz of beer, 5 oz of wine, or 1 oz of hard liquor.  If you are overweight, ask your health care provider to recommend a weight-loss plan for you. Losing 5-10 lb (2.2-4.5 kg) can reduce your risk of diabetes, atherosclerosis, and high blood pressure.  Exercise for 30?60 minutes on most days, or as much as told by your health care provider. ? Do moderate-intensity exercise, such as brisk walking,  bicycling, and water aerobics. Ask your health care provider which activities are safe for you.  Do not use any products that contain nicotine or tobacco, such as cigarettes and e-cigarettes. If you need help quitting, ask your health care provider. Why are these changes important? Making these changes lowers your risk of many diseases that can cause cerebrovascular  disease and stroke. Stroke is a leading cause of death and disability. Making these changes also improves your overall health and quality of life. What can I do to lower my risk? The following factors make you more likely to develop cerebrovascular disease:  Being overweight.  Smoking.  Being physically inactive.  Eating a high-fat diet.  Having certain health conditions, such as: ? Diabetes. ? High blood pressure. ? Heart disease. ? Atherosclerosis. ? High cholesterol. ? Sickle cell disease.  Talk with your health care provider about your risk for cerebrovascular disease. Work with your health care provider to control diseases that you have that may contribute to cerebrovascular disease. Your health care provider may prescribe medicines to help prevent major causes of cerebrovascular disease. Where to find more information: Learn more about preventing cerebrovascular disease from:  National Heart, Lung, and Blood Institute: ClassGossip.pl  Centers for Disease Control and Prevention: http://www.gonzalez-chase.net/  Summary  Cerebrovascular disease can lead to a stroke.  Atherosclerosis and high blood pressure are major causes of cerebrovascular disease.  Making diet and lifestyle changes can reduce your risk of cerebrovascular disease.  Work with your health care provider to get your risk factors under control to reduce your risk of cerebrovascular disease. This information is not intended to replace advice given to you by your health care provider. Make sure you discuss any questions you have with your health care provider. Document Released: 10/29/2015 Document Revised: 05/03/2016 Document Reviewed: 10/29/2015 Elsevier Interactive Patient Education  2018 [employer].

## 2018-08-23 LAB — COMPREHENSIVE METABOLIC PANEL
A/G RATIO: 1 — AB (ref 1.2–2.2)
ALBUMIN: 3.6 g/dL (ref 3.5–4.8)
ALK PHOS: 106 IU/L (ref 39–117)
ALT: 14 IU/L (ref 0–44)
AST: 16 IU/L (ref 0–40)
BILIRUBIN TOTAL: 0.4 mg/dL (ref 0.0–1.2)
BUN / CREAT RATIO: 19 (ref 10–24)
BUN: 22 mg/dL (ref 8–27)
CHLORIDE: 95 mmol/L — AB (ref 96–106)
CO2: 20 mmol/L (ref 20–29)
Calcium: 9.5 mg/dL (ref 8.6–10.2)
Creatinine, Ser: 1.15 mg/dL (ref 0.76–1.27)
GFR calc Af Amer: 71 mL/min/{1.73_m2} (ref >59)
GFR calc non Af Amer: 61 mL/min/{1.73_m2} (ref >59)
GLOBULIN, TOTAL: 3.5 g/dL (ref 1.5–4.5)
Glucose: 96 mg/dL (ref 65–99)
POTASSIUM: 4.5 mmol/L (ref 3.5–5.2)
SODIUM: 137 mmol/L (ref 134–144)
Total Protein: 7.1 g/dL (ref 6.0–8.5)

## 2018-08-24 ENCOUNTER — Ambulatory Visit (INDEPENDENT_AMBULATORY_CARE_PROVIDER_SITE_OTHER): Payer: Medicare Other | Admitting: Orthopaedic Surgery

## 2018-08-24 DIAGNOSIS — M19011 Primary osteoarthritis, right shoulder: Secondary | ICD-10-CM

## 2018-08-24 DIAGNOSIS — G8929 Other chronic pain: Secondary | ICD-10-CM | POA: Diagnosis not present

## 2018-08-24 DIAGNOSIS — M25511 Pain in right shoulder: Secondary | ICD-10-CM

## 2018-08-24 NOTE — Progress Notes (Signed)
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  It was bothering him quite a bit and he is a [employer] architect.  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  We did have Dr. Alvester Morin perform an intra-articular steroid injection under fluoroscopy in the right shoulder glenohumeral joint.  He says this is helped him well so far and is doing great.  He is worried about the pain that he is going to have in that shoulder and continue experience.  On exam he does move his shoulder better actively and passively but she can still feel a grinding of the glenohumeral joint.  He seems to tolerate better.  At this point I am happy to send him for a surgical evaluation for shoulder replacement surgery but he would like to hold off and see how this injection does which I agree with his well.  He understands that he needs to wait at least 3 to 4 months before considering another steroid injection.  He will get back to driving his truck and can follow-up as needed.  If things worsen he will let us know.

## 2018-08-26 ENCOUNTER — Encounter: Payer: Self-pay | Admitting: Family Medicine

## 2018-10-01 ENCOUNTER — Telehealth: Payer: Self-pay | Admitting: Family Medicine

## 2018-10-01 NOTE — Telephone Encounter (Signed)
Called and rescheduled pts appt from 11/12/18 to 11/26/18 due to Dr. Creta LevinStallings schedule change. I advised pt of fasting, time, building and late policy. Pt acknowledged.

## 2018-10-14 ENCOUNTER — Ambulatory Visit: Payer: Medicare Other | Admitting: Allergy

## 2018-10-15 ENCOUNTER — Ambulatory Visit: Payer: Medicare Other | Admitting: Allergy

## 2018-11-09 DIAGNOSIS — H524 Presbyopia: Secondary | ICD-10-CM | POA: Diagnosis not present

## 2018-11-09 DIAGNOSIS — H2513 Age-related nuclear cataract, bilateral: Secondary | ICD-10-CM | POA: Diagnosis not present

## 2018-11-09 DIAGNOSIS — E119 Type 2 diabetes mellitus without complications: Secondary | ICD-10-CM | POA: Diagnosis not present

## 2018-11-09 LAB — HM DIABETES EYE EXAM

## 2018-11-12 ENCOUNTER — Encounter: Payer: Medicare Other | Admitting: Family Medicine

## 2018-11-12 DIAGNOSIS — H25813 Combined forms of age-related cataract, bilateral: Secondary | ICD-10-CM | POA: Diagnosis not present

## 2018-11-12 DIAGNOSIS — H25819 Combined forms of age-related cataract, unspecified eye: Secondary | ICD-10-CM | POA: Insufficient documentation

## 2018-11-12 DIAGNOSIS — H524 Presbyopia: Secondary | ICD-10-CM | POA: Diagnosis not present

## 2018-11-12 DIAGNOSIS — E119 Type 2 diabetes mellitus without complications: Secondary | ICD-10-CM | POA: Diagnosis not present

## 2018-11-26 ENCOUNTER — Ambulatory Visit (INDEPENDENT_AMBULATORY_CARE_PROVIDER_SITE_OTHER): Payer: Medicare Other | Admitting: Family Medicine

## 2018-11-26 ENCOUNTER — Other Ambulatory Visit: Payer: Self-pay

## 2018-11-26 ENCOUNTER — Encounter: Payer: Self-pay | Admitting: Family Medicine

## 2018-11-26 VITALS — BP 132/79 | HR 74 | Temp 97.7°F | Ht 66.0 in | Wt 224.4 lb

## 2018-11-26 DIAGNOSIS — Z0000 Encounter for general adult medical examination without abnormal findings: Secondary | ICD-10-CM

## 2018-11-26 DIAGNOSIS — I10 Essential (primary) hypertension: Secondary | ICD-10-CM | POA: Diagnosis not present

## 2018-11-26 DIAGNOSIS — Z0001 Encounter for general adult medical examination with abnormal findings: Secondary | ICD-10-CM

## 2018-11-26 DIAGNOSIS — E1169 Type 2 diabetes mellitus with other specified complication: Secondary | ICD-10-CM | POA: Diagnosis not present

## 2018-11-26 MED ORDER — BENAZEPRIL-HYDROCHLOROTHIAZIDE 20-12.5 MG PO TABS: 2.0000 | ORAL_TABLET | Freq: Every day | ORAL | 3 refills | Status: DC

## 2018-11-26 MED ORDER — METFORMIN HCL 1000 MG PO TABS: 1000.0000 mg | ORAL_TABLET | Freq: Two times a day (BID) | ORAL | 1 refills | Status: DC

## 2018-11-26 MED ORDER — GLIPIZIDE ER 10 MG PO TB24: 10.0000 mg | ORAL_TABLET | Freq: Two times a day (BID) | ORAL | 1 refills | Status: DC

## 2018-11-26 MED ORDER — PIOGLITAZONE HCL 45 MG PO TABS: 45.0000 mg | ORAL_TABLET | Freq: Every day | ORAL | 1 refills | Status: DC

## 2018-11-26 MED ORDER — AMLODIPINE BESYLATE 10 MG PO TABS: 10.0000 mg | ORAL_TABLET | Freq: Every day | ORAL | 3 refills | Status: DC
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
## 2018-11-26 NOTE — Patient Instructions (Addendum)
   If you have lab work done today you will be contacted with your lab results within the next 2 weeks.  If you have not heard from us then please contact us. The fastest way to get your results is to register for My Chart.   IF you received an x-ray today, you will receive an invoice from [facility]. Please contact [facility] at [phone] with questions or concerns regarding your invoice.   IF you received labwork today, you will receive an invoice from [facility]. Please contact [facility] at [phone] with questions or concerns regarding your invoice.   Our billing staff will not be able to assist you with questions regarding bills from these companies.  You will be contacted with the lab results as soon as they are available. The fastest way to get your results is to activate your My Chart account. Instructions are located on the last page of this paperwork. If you have not heard from us regarding the results in 2 weeks, please contact this office.     Preventive Care 78 Years and Older, Male Preventive care refers to lifestyle choices and visits with your health care provider that can promote health and wellness. What does preventive care include?   A yearly physical exam. This is also called an annual well check.  Dental exams once or twice a year.  Routine eye exams. Ask your health care provider how often you should have your eyes checked.  Personal lifestyle choices, including: ? Daily care of your teeth and gums. ? Regular physical activity. ? Eating a healthy diet. ? Avoiding tobacco and drug use. ? Limiting alcohol use. ? Practicing safe sex. ? Taking low doses of aspirin every day. ? Taking vitamin and mineral supplements as recommended by your health care provider. What happens during an annual well check? The services and screenings done by your health care provider during your annual well check will depend on your age, overall health,  lifestyle risk factors, and family history of disease. Counseling Your health care provider may ask you questions about your:  Alcohol use.  Tobacco use.  Drug use.  Emotional well-being.  Home and relationship well-being.  Sexual activity.  Eating habits.  History of falls.  Memory and ability to understand (cognition).  Work and work environment. Screening You may have the following tests or measurements:  Height, weight, and BMI.  Blood pressure.  Lipid and cholesterol levels. These may be checked every 5 years, or more frequently if you are over 50 years old.  Skin check.  Lung cancer screening. You may have this screening every year starting at age 55 if you have a 30-pack-year history of smoking and currently smoke or have quit within the past 15 years.  Colorectal cancer screening. All adults should have this screening starting at age 50 and continuing until age 75. You will have tests every 1-10 years, depending on your results and the type of screening test. People at increased risk should start screening at an earlier age. Screening tests may include: ? Guaiac-based fecal occult blood testing. ? Fecal immunochemical test (FIT). ? Stool DNA test. ? Virtual colonoscopy. ? Sigmoidoscopy. During this test, a flexible tube with a tiny camera (sigmoidoscope) is used to examine your rectum and lower colon. The sigmoidoscope is inserted through your anus into your rectum and lower colon. ? Colonoscopy. During this test, a long, thin, flexible tube with a tiny camera (colonoscope) is used to examine your entire colon and   rectum.  Prostate cancer screening. Recommendations will vary depending on your family history and other risks.  Hepatitis C blood test.  Hepatitis B blood test.  Sexually transmitted disease (STD) testing.  Diabetes screening. This is done by checking your blood sugar (glucose) after you have not eaten for a while (fasting). You may have this  done every 1-3 years.  Abdominal aortic aneurysm (AAA) screening. You may need this if you are a current or former smoker.  Osteoporosis. You may be screened starting at age 53 if you are at high risk. Talk with your health care provider about your test results, treatment options, and if necessary, the need for more tests. Vaccines Your health care provider may recommend certain vaccines, such as:  Influenza vaccine. This is recommended every year.  Tetanus, diphtheria, and acellular pertussis (Tdap, Td) vaccine. You may need a Td booster every 10 years.  Varicella vaccine. You may need this if you have not been vaccinated.  Zoster vaccine. You may need this after age 49.  Measles, mumps, and rubella (MMR) vaccine. You may need at least one dose of MMR if you were born in 1957 or later. You may also need a second dose.  Pneumococcal 13-valent conjugate (PCV13) vaccine. One dose is recommended after age 29.  Pneumococcal polysaccharide (PPSV23) vaccine. One dose is recommended after age 73.  Meningococcal vaccine. You may need this if you have certain conditions.  Hepatitis A vaccine. You may need this if you have certain conditions or if you travel or work in places where you may be exposed to hepatitis A.  Hepatitis B vaccine. You may need this if you have certain conditions or if you travel or work in places where you may be exposed to hepatitis B.  Haemophilus influenzae type b (Hib) vaccine. You may need this if you have certain risk factors. Talk to your health care provider about which screenings and vaccines you need and how often you need them. This information is not intended to replace advice given to you by your health care provider. Make sure you discuss any questions you have with your health care provider. Document Released: 11/10/2015 Document Revised: 12/04/2017 Document Reviewed: 08/15/2015 Elsevier Interactive Patient Education  2019 [employer].

## 2018-11-27 LAB — LIPID PANEL
Chol/HDL Ratio: 3.2 ratio (ref 0.0–5.0)
Cholesterol, Total: 165 mg/dL (ref 100–199)
HDL: 51 mg/dL (ref >39)
LDL Calculated: 99 mg/dL (ref 0–99)
Triglycerides: 77 mg/dL (ref 0–149)
VLDL Cholesterol Cal: 15 mg/dL (ref 5–40)

## 2018-11-27 LAB — CMP14+EGFR
ALT: 21 IU/L (ref 0–44)
AST: 19 IU/L (ref 0–40)
Albumin/Globulin Ratio: 1.6 (ref 1.2–2.2)
Albumin: 4.3 g/dL (ref 3.7–4.7)
Alkaline Phosphatase: 78 IU/L (ref 39–117)
BUN/Creatinine Ratio: 22 (ref 10–24)
BUN: 21 mg/dL (ref 8–27)
Bilirubin Total: 0.3 mg/dL (ref 0.0–1.2)
CO2: 27 mmol/L (ref 20–29)
Calcium: 9.9 mg/dL (ref 8.6–10.2)
Chloride: 97 mmol/L (ref 96–106)
Creatinine, Ser: 0.95 mg/dL (ref 0.76–1.27)
GFR calc Af Amer: 89 mL/min/{1.73_m2} (ref >59)
GFR calc non Af Amer: 77 mL/min/{1.73_m2} (ref >59)
Globulin, Total: 2.7 g/dL (ref 1.5–4.5)
Glucose: 105 mg/dL — ABNORMAL HIGH (ref 65–99)
Potassium: 4.5 mmol/L (ref 3.5–5.2)
Sodium: 140 mmol/L (ref 134–144)
Total Protein: 7 g/dL (ref 6.0–8.5)

## 2018-11-27 LAB — HEMOGLOBIN A1C
Est. average glucose Bld gHb Est-mCnc: 180 mg/dL
Hgb A1c MFr Bld: 7.9 % — ABNORMAL HIGH (ref 4.8–5.6)

## 2018-11-27 LAB — MICROALBUMIN / CREATININE URINE RATIO
Creatinine, Urine: 79.8 mg/dL
Microalb/Creat Ratio: <4 mg/g creat (ref 0–29)
Microalbumin, Urine: <3 ug/mL

## 2018-11-27 LAB — TSH: TSH: 3.38 u[IU]/mL (ref 0.450–4.500)

## 2018-12-15 ENCOUNTER — Encounter: Payer: Self-pay | Admitting: Radiology

## 2019-01-04 DIAGNOSIS — H25812 Combined forms of age-related cataract, left eye: Secondary | ICD-10-CM | POA: Diagnosis not present

## 2019-01-04 DIAGNOSIS — H269 Unspecified cataract: Secondary | ICD-10-CM | POA: Diagnosis not present

## 2019-03-15 DIAGNOSIS — H25811 Combined forms of age-related cataract, right eye: Secondary | ICD-10-CM | POA: Diagnosis not present

## 2019-03-15 DIAGNOSIS — E119 Type 2 diabetes mellitus without complications: Secondary | ICD-10-CM | POA: Diagnosis not present

## 2019-03-15 DIAGNOSIS — H269 Unspecified cataract: Secondary | ICD-10-CM | POA: Diagnosis not present

## 2019-04-12 DIAGNOSIS — Z961 Presence of intraocular lens: Secondary | ICD-10-CM | POA: Diagnosis not present

## 2019-05-18 ENCOUNTER — Encounter: Payer: Self-pay | Admitting: Family Medicine

## 2019-05-18 ENCOUNTER — Other Ambulatory Visit: Payer: Self-pay

## 2019-05-18 ENCOUNTER — Ambulatory Visit (INDEPENDENT_AMBULATORY_CARE_PROVIDER_SITE_OTHER): Payer: Medicare Other | Admitting: Family Medicine

## 2019-05-18 VITALS — BP 118/80 | HR 79 | Temp 98.4°F | Ht 66.0 in | Wt 231.0 lb

## 2019-05-18 DIAGNOSIS — E1169 Type 2 diabetes mellitus with other specified complication: Secondary | ICD-10-CM

## 2019-05-18 DIAGNOSIS — L97919 Non-pressure chronic ulcer of unspecified part of right lower leg with unspecified severity: Secondary | ICD-10-CM | POA: Diagnosis not present

## 2019-05-18 DIAGNOSIS — I10 Essential (primary) hypertension: Secondary | ICD-10-CM | POA: Diagnosis not present

## 2019-05-18 DIAGNOSIS — I739 Peripheral vascular disease, unspecified: Secondary | ICD-10-CM | POA: Diagnosis not present

## 2019-05-18 DIAGNOSIS — R0989 Other specified symptoms and signs involving the circulatory and respiratory systems: Secondary | ICD-10-CM

## 2019-05-18 DIAGNOSIS — I83019 Varicose veins of right lower extremity with ulcer of unspecified site: Secondary | ICD-10-CM | POA: Diagnosis not present

## 2019-05-18 LAB — CMP14+EGFR
ALT: 21 IU/L (ref 0–44)
AST: 20 IU/L (ref 0–40)
Albumin/Globulin Ratio: 1.9 (ref 1.2–2.2)
Albumin: 4.5 g/dL (ref 3.7–4.7)
Alkaline Phosphatase: 74 IU/L (ref 39–117)
BUN/Creatinine Ratio: 19 (ref 10–24)
BUN: 20 mg/dL (ref 8–27)
Bilirubin Total: 0.4 mg/dL (ref 0.0–1.2)
CO2: 23 mmol/L (ref 20–29)
Calcium: 9.9 mg/dL (ref 8.6–10.2)
Chloride: 102 mmol/L (ref 96–106)
Creatinine, Ser: 1.03 mg/dL (ref 0.76–1.27)
GFR calc Af Amer: 81 mL/min/{1.73_m2} (ref >59)
GFR calc non Af Amer: 70 mL/min/{1.73_m2} (ref >59)
Globulin, Total: 2.4 g/dL (ref 1.5–4.5)
Glucose: 90 mg/dL (ref 65–99)
Potassium: 4.1 mmol/L (ref 3.5–5.2)
Sodium: 145 mmol/L — ABNORMAL HIGH (ref 134–144)
Total Protein: 6.9 g/dL (ref 6.0–8.5)

## 2019-05-18 LAB — HEMOGLOBIN A1C
Est. average glucose Bld gHb Est-mCnc: 154 mg/dL
Hgb A1c MFr Bld: 7 % — ABNORMAL HIGH (ref 4.8–5.6)

## 2019-05-18 MED ORDER — GLIPIZIDE ER 10 MG PO TB24: 10.0000 mg | ORAL_TABLET | Freq: Two times a day (BID) | ORAL | 1 refills | Status: DC

## 2019-05-18 MED ORDER — BENAZEPRIL-HYDROCHLOROTHIAZIDE 20-12.5 MG PO TABS: 2.0000 | ORAL_TABLET | Freq: Every day | ORAL | 3 refills | Status: DC

## 2019-05-18 MED ORDER — PIOGLITAZONE HCL 45 MG PO TABS: 45.0000 mg | ORAL_TABLET | Freq: Every day | ORAL | 1 refills | Status: DC

## 2019-05-18 MED ORDER — OMEPRAZOLE 20 MG PO CPDR: 20.0000 mg | DELAYED_RELEASE_CAPSULE | Freq: Every day | ORAL | 3 refills | Status: DC

## 2019-05-18 MED ORDER — AMLODIPINE BESYLATE 10 MG PO TABS: 10.0000 mg | ORAL_TABLET | Freq: Every day | ORAL | 3 refills | Status: DC

## 2019-05-18 MED ORDER — METFORMIN HCL 1000 MG PO TABS: 1000.0000 mg | ORAL_TABLET | Freq: Two times a day (BID) | ORAL | 1 refills | Status: DC

## 2019-05-18 MED ORDER — DOXYCYCLINE HYCLATE 100 MG PO TABS: 100.0000 mg | ORAL_TABLET | Freq: Two times a day (BID) | ORAL | 0 refills | Status: DC
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
## 2019-05-18 NOTE — Patient Instructions (Signed)
° ° ° °  If you have lab work done today you will be contacted with your lab results within the next 2 weeks.  If you have not heard from us then please contact us. The fastest way to get your results is to register for My Chart. ° ° °IF you received an x-ray today, you will receive an invoice from [facility]. Please contact [facility] at [phone] with questions or concerns regarding your invoice.  ° °IF you received labwork today, you will receive an invoice from [facility]. Please contact [facility] at [phone] with questions or concerns regarding your invoice.  ° °Our billing staff will not be able to assist you with questions regarding bills from these companies. ° °You will be contacted with the lab results as soon as they are available. The fastest way to get your results is to activate your My Chart account. Instructions are located on the last page of this paperwork. If you have not heard from us regarding the results in 2 weeks, please contact this office. °  ° ° ° °

## 2019-05-20 ENCOUNTER — Telehealth (HOSPITAL_COMMUNITY): Payer: Self-pay

## 2019-05-20 NOTE — Telephone Encounter (Signed)
The above patient or their representative was contacted and gave the following answers to these questions:         Do you have any of the following symptoms?  no  Fever                    Cough                   Shortness of breath  Do  you have any of the following other symptoms?    muscle pain         vomiting,        diarrhea        rash         weakness        red eye        abdominal pain         bruising          bruising or bleeding              joint pain           severe headache    Have you been in contact with someone who was or has been sick in the past 2 weeks?  no  Yes                 Unsure                         Unable to assess   Does the person that you were in contact with have any of the following symptoms?   Cough         shortness of breath           muscle pain         vomiting,            diarrhea            rash            weakness           fever            red eye           abdominal pain           bruising  or  bleeding                joint pain                severe headache                      

## 2019-05-21 ENCOUNTER — Ambulatory Visit (HOSPITAL_COMMUNITY)
Admission: RE | Admit: 2019-05-21 | Discharge: 2019-05-21 | Disposition: A | Discharge status: Home / Self Care | Payer: Medicare Other | Source: Ambulatory Visit | Attending: Family | Admitting: Family

## 2019-05-21 ENCOUNTER — Other Ambulatory Visit: Payer: Self-pay

## 2019-05-21 DIAGNOSIS — R0989 Other specified symptoms and signs involving the circulatory and respiratory systems: Secondary | ICD-10-CM | POA: Insufficient documentation

## 2019-05-21 DIAGNOSIS — I83019 Varicose veins of right lower extremity with ulcer of unspecified site: Secondary | ICD-10-CM | POA: Diagnosis not present

## 2019-05-21 DIAGNOSIS — L97919 Non-pressure chronic ulcer of unspecified part of right lower leg with unspecified severity: Secondary | ICD-10-CM

## 2019-05-21 DIAGNOSIS — I739 Peripheral vascular disease, unspecified: Secondary | ICD-10-CM

## 2019-05-26 NOTE — Addendum Note (Signed)
Addended by: Rutherford Guys on: 05/26/2019 08:18 PM   Modules accepted: Orders

## 2019-05-31 NOTE — Progress Notes (Signed)
FYI - You will be seeing him on Aug 11th.

## 2019-06-08 ENCOUNTER — Other Ambulatory Visit: Payer: Self-pay

## 2019-06-08 ENCOUNTER — Ambulatory Visit (INDEPENDENT_AMBULATORY_CARE_PROVIDER_SITE_OTHER): Payer: Medicare Other | Admitting: Family Medicine

## 2019-06-08 ENCOUNTER — Encounter: Payer: Self-pay | Admitting: Family Medicine

## 2019-06-08 VITALS — BP 127/74 | HR 67 | Temp 97.5°F | Ht 60.0 in | Wt 229.0 lb

## 2019-06-08 DIAGNOSIS — L97919 Non-pressure chronic ulcer of unspecified part of right lower leg with unspecified severity: Secondary | ICD-10-CM | POA: Diagnosis not present

## 2019-06-08 DIAGNOSIS — E1169 Type 2 diabetes mellitus with other specified complication: Secondary | ICD-10-CM

## 2019-06-08 DIAGNOSIS — I83019 Varicose veins of right lower extremity with ulcer of unspecified site: Secondary | ICD-10-CM | POA: Diagnosis not present

## 2019-06-08 NOTE — Patient Instructions (Addendum)
  Lab Results  Component Value Date   HGBA1C 7.0 (H) 05/18/2019      If you have lab work done today you will be contacted with your lab results within the next 2 weeks.  If you have not heard from [place] then please contact us. The fastest way to get your results is to register for My Chart.   IF you received an x-ray today, you will receive an invoice from [facility]. Please contact [facility] at [phone] with questions or concerns regarding your invoice.   IF you received labwork today, you will receive an invoice from [facility]. Please contact [facility] at [phone] with questions or concerns regarding your invoice.   Our billing staff will not be able to assist you with questions regarding bills from these companies.  You will be contacted with the lab results as soon as they are available. The fastest way to get your results is to activate your My Chart account. Instructions are located on the last page of this paperwork. If you have not heard from [place] regarding the results in 2 weeks, please contact this office.
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
## 2019-07-26 ENCOUNTER — Encounter: Payer: Self-pay | Admitting: Physician Assistant

## 2019-07-26 ENCOUNTER — Ambulatory Visit (INDEPENDENT_AMBULATORY_CARE_PROVIDER_SITE_OTHER): Payer: Medicare Other | Admitting: Physician Assistant

## 2019-07-26 ENCOUNTER — Ambulatory Visit (INDEPENDENT_AMBULATORY_CARE_PROVIDER_SITE_OTHER): Payer: Medicare Other

## 2019-07-26 DIAGNOSIS — M79641 Pain in right hand: Secondary | ICD-10-CM

## 2019-07-26 MED ORDER — DOXYCYCLINE HYCLATE 100 MG PO TABS: 100.0000 mg | ORAL_TABLET | Freq: Two times a day (BID) | ORAL | 0 refills | Status: DC

## 2019-07-26 MED ORDER — TRAMADOL HCL 50 MG PO TABS: 50.0000 mg | ORAL_TABLET | Freq: Four times a day (QID) | ORAL | 0 refills | Status: DC | PRN
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
## 2019-07-27 ENCOUNTER — Encounter: Payer: Medicare Other | Admitting: Vascular Surgery

## 2019-08-02 ENCOUNTER — Encounter: Payer: Self-pay | Admitting: Physician Assistant

## 2019-08-02 ENCOUNTER — Ambulatory Visit (INDEPENDENT_AMBULATORY_CARE_PROVIDER_SITE_OTHER): Payer: Medicare Other | Admitting: Physician Assistant

## 2019-08-02 DIAGNOSIS — M79641 Pain in right hand: Secondary | ICD-10-CM

## 2019-08-02 NOTE — Progress Notes (Signed)
HPI: Mr. Tavano returns today for follow-up of his right hand.  He states the redness is gone away the knot is still there but not as tender.  He states that he is starting to itch.  He is been soaking the hand and also taking doxycycline.  He has had no fevers chills.  Review of systems: Please see HPI otherwise negative  Physical exam:  General well-developed well-nourished male no acute distress mood and affect appropriate.  Psych: Alert and oriented x3  Right hand: Dorsal aspect of the hand much improved edema.  He has wrinkles in the skin again.  No cellulitis.  No expressible purulence no signs of infection.  He is nontender throughout the hand particularly over the first metacarpal.  Impression: Right hand cellulitis/pain resolved  Plan: Likely finished antibiotics.  He is activity as tolerated.  Follow-up with [place] on as-needed basis.  Questions encouraged and answered.

## 2019-09-07 ENCOUNTER — Ambulatory Visit: Payer: Medicare Other | Admitting: Family Medicine

## 2019-09-07 ENCOUNTER — Other Ambulatory Visit: Payer: Self-pay | Admitting: Family Medicine

## 2019-09-08 NOTE — Telephone Encounter (Signed)
Requested medication (s) are due for refill today: yes  Requested medication (s) are on the active medication list: yes  Last refill:  05/18/2019  Future visit scheduled: yes  Notes to clinic:  Patient has appointment on 09/13/2019. Review for refill   Requested Prescriptions  Pending Prescriptions Disp Refills   omeprazole (PRILOSEC) 20 MG capsule [Pharmacy Med Name: OMEPRAZOLE DR 20 MG CAPSULE] 90 capsule 1    Sig: TAKE 1 CAPSULE BY MOUTH EVERY DAY     Gastroenterology: Proton Pump Inhibitors Passed - 09/07/2019  7:29 PM      Passed - Valid encounter within last 12 months    Recent Outpatient Visits          3 months ago Type 2 diabetes mellitus with other specified complication, unspecified whether long term insulin use ([place])   Primary Care at [facility], Zoe A, MD   3 months ago Type 2 diabetes mellitus with other specified complication, unspecified whether long term insulin use ([place])   [facility] at [facility], Lilia Argue, MD   9 months ago Encounter for [employer] annual wellness exam   Primary Care at [place], Lilia Argue, MD   1 year ago Type 2 diabetes mellitus with other specified complication, unspecified whether long term insulin use [facility])   Primary Care at [place], MD   1 year ago Chronic cough   Primary Care at [place], MD      Future Appointments            In 5 days Rutherford Guys, MD Primary Care at [place], [facility]

## 2019-09-13 ENCOUNTER — Other Ambulatory Visit: Payer: Self-pay

## 2019-09-13 ENCOUNTER — Ambulatory Visit (INDEPENDENT_AMBULATORY_CARE_PROVIDER_SITE_OTHER): Payer: Medicare Other | Admitting: Family Medicine

## 2019-09-13 ENCOUNTER — Encounter: Payer: Self-pay | Admitting: Family Medicine

## 2019-09-13 VITALS — BP 122/82 | HR 78 | Ht 66.0 in | Wt 230.0 lb

## 2019-09-13 DIAGNOSIS — E782 Mixed hyperlipidemia: Secondary | ICD-10-CM

## 2019-09-13 DIAGNOSIS — I10 Essential (primary) hypertension: Secondary | ICD-10-CM | POA: Diagnosis not present

## 2019-09-13 DIAGNOSIS — E1159 Type 2 diabetes mellitus with other circulatory complications: Secondary | ICD-10-CM

## 2019-09-13 DIAGNOSIS — L84 Corns and callosities: Secondary | ICD-10-CM
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
## 2019-09-14 LAB — COMPREHENSIVE METABOLIC PANEL WITH GFR
ALT: 20 [IU]/L (ref 0–44)
AST: 22 [IU]/L (ref 0–40)
Albumin/Globulin Ratio: 1.7 (ref 1.2–2.2)
Albumin: 4.3 g/dL (ref 3.7–4.7)
Alkaline Phosphatase: 69 [IU]/L (ref 39–117)
BUN/Creatinine Ratio: 16 (ref 10–24)
BUN: 16 mg/dL (ref 8–27)
Bilirubin Total: 0.5 mg/dL (ref 0.0–1.2)
CO2: 25 mmol/L (ref 20–29)
Calcium: 9.5 mg/dL (ref 8.6–10.2)
Chloride: 101 mmol/L (ref 96–106)
Creatinine, Ser: 0.97 mg/dL (ref 0.76–1.27)
GFR calc Af Amer: 86 mL/min/{1.73_m2}
GFR calc non Af Amer: 74 mL/min/{1.73_m2}
Globulin, Total: 2.5 g/dL (ref 1.5–4.5)
Glucose: 87 mg/dL (ref 65–99)
Potassium: 4.2 mmol/L (ref 3.5–5.2)
Sodium: 141 mmol/L (ref 134–144)
Total Protein: 6.8 g/dL (ref 6.0–8.5)

## 2019-09-14 LAB — LIPID PANEL
Chol/HDL Ratio: 3.8 ratio (ref 0.0–5.0)
Cholesterol, Total: 169 mg/dL (ref 100–199)
HDL: 45 mg/dL (ref >39)
LDL Chol Calc (NIH): 104 mg/dL — ABNORMAL HIGH (ref 0–99)
Triglycerides: 108 mg/dL (ref 0–149)
VLDL Cholesterol Cal: 20 mg/dL (ref 5–40)

## 2019-09-14 LAB — HEMOGLOBIN A1C
Est. average glucose Bld gHb Est-mCnc: 154 mg/dL
Hgb A1c MFr Bld: 7 % — ABNORMAL HIGH (ref 4.8–5.6)

## 2019-09-15 ENCOUNTER — Ambulatory Visit (INDEPENDENT_AMBULATORY_CARE_PROVIDER_SITE_OTHER): Payer: Medicare Other | Admitting: Podiatry

## 2019-09-15 ENCOUNTER — Other Ambulatory Visit: Payer: Self-pay

## 2019-09-15 ENCOUNTER — Encounter: Payer: Self-pay | Admitting: Podiatry

## 2019-09-15 DIAGNOSIS — L84 Corns and callosities: Secondary | ICD-10-CM | POA: Diagnosis not present

## 2019-09-15 DIAGNOSIS — T300 Burn of unspecified body region, unspecified degree: Secondary | ICD-10-CM
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
## 2019-09-16 ENCOUNTER — Encounter: Payer: Self-pay | Admitting: Radiology

## 2019-09-28 ENCOUNTER — Ambulatory Visit (INDEPENDENT_AMBULATORY_CARE_PROVIDER_SITE_OTHER): Payer: Medicare Other | Admitting: Podiatry

## 2019-09-28 ENCOUNTER — Other Ambulatory Visit: Payer: Self-pay

## 2019-09-28 DIAGNOSIS — T300 Burn of unspecified body region, unspecified degree: Secondary | ICD-10-CM

## 2019-09-28 DIAGNOSIS — L84 Corns and callosities: Secondary | ICD-10-CM
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
## 2019-11-02 ENCOUNTER — Telehealth: Payer: Self-pay | Admitting: Radiology

## 2019-11-02 ENCOUNTER — Other Ambulatory Visit: Payer: Self-pay | Admitting: Physician Assistant

## 2019-11-02 MED ORDER — TRAMADOL HCL 50 MG PO TABS: 50.0000 mg | ORAL_TABLET | Freq: Four times a day (QID) | ORAL | 0 refills | Status: DC | PRN

## 2019-11-02 NOTE — Telephone Encounter (Signed)
Patient aware Rx is ready for pick up. 

## 2019-11-02 NOTE — Telephone Encounter (Signed)
Sent RX in

## 2019-11-02 NOTE — Telephone Encounter (Signed)
Patient walked into office this afternoon requesting refill on tramadol. Patient aware Dr. Temple Pacini are in surgery.

## 2019-11-08 ENCOUNTER — Other Ambulatory Visit: Payer: Medicare Other | Admitting: Orthotics

## 2019-11-10 LAB — HM DIABETES EYE EXAM

## 2019-11-19 ENCOUNTER — Other Ambulatory Visit: Payer: Self-pay | Admitting: Family Medicine

## 2019-12-09 DIAGNOSIS — Z961 Presence of intraocular lens: Secondary | ICD-10-CM | POA: Diagnosis not present

## 2019-12-09 DIAGNOSIS — E119 Type 2 diabetes mellitus without complications: Secondary | ICD-10-CM | POA: Diagnosis not present

## 2019-12-09 DIAGNOSIS — H524 Presbyopia: Secondary | ICD-10-CM | POA: Diagnosis not present

## 2019-12-21 ENCOUNTER — Ambulatory Visit (INDEPENDENT_AMBULATORY_CARE_PROVIDER_SITE_OTHER): Payer: Medicare Other | Admitting: Podiatry

## 2019-12-21 ENCOUNTER — Other Ambulatory Visit: Payer: Self-pay

## 2019-12-21 ENCOUNTER — Encounter: Payer: Self-pay | Admitting: Podiatry

## 2019-12-21 DIAGNOSIS — M779 Enthesopathy, unspecified: Secondary | ICD-10-CM | POA: Diagnosis not present
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
## 2020-01-31 ENCOUNTER — Telehealth: Payer: Self-pay

## 2020-01-31 ENCOUNTER — Other Ambulatory Visit: Payer: Self-pay | Admitting: Orthopaedic Surgery

## 2020-01-31 MED ORDER — TRAMADOL HCL 50 MG PO TABS: 50.0000 mg | ORAL_TABLET | Freq: Four times a day (QID) | ORAL | 0 refills | Status: DC | PRN

## 2020-01-31 NOTE — Telephone Encounter (Signed)
Patient would like a RF on Tramadol.  Pharm. [facility] [address]  [phone]

## 2020-01-31 NOTE — Telephone Encounter (Signed)
I will send some more in.  At this point though, if he needs more after that he needs to be seen first.

## 2020-01-31 NOTE — Telephone Encounter (Signed)
Please advise 

## 2020-01-31 NOTE — Telephone Encounter (Signed)
Patient aware.

## 2020-02-23 ENCOUNTER — Other Ambulatory Visit: Payer: Self-pay | Admitting: Family Medicine

## 2020-02-23 NOTE — Telephone Encounter (Signed)
Requested Prescriptions  Pending Prescriptions Disp Refills  . glipiZIDE (GLUCOTROL XL) 10 MG 24 hr tablet [Pharmacy Med Name: GLIPIZIDE ER 10 MG TABLET] 180 tablet 0    Sig: TAKE 1 TABLET BY MOUTH TWICE A DAY     Endocrinology:  Diabetes - Sulfonylureas Passed - 02/23/2020  3:08 AM      Passed - HBA1C is between 0 and 7.9 and within 180 days    Hgb A1c MFr Bld  Date Value Ref Range Status  09/13/2019 7.0 (H) 4.8 - 5.6 % Final    Comment:             Prediabetes: 5.7 - 6.4          Diabetes: >6.4          Glycemic control for adults with diabetes: <7.0          Passed - Valid encounter within last 6 months    Recent Outpatient Visits          5 months ago Type 2 diabetes mellitus with other circulatory complication, without long-term current use of insulin ([facility])   [facility] at [facility], Meda Coffee, MD   8 months ago Type 2 diabetes mellitus with other specified complication, unspecified whether long term insulin use ([facility])   Primary Care at [facility], [place] A, MD   9 months ago Type 2 diabetes mellitus with other specified complication, unspecified whether long term insulin use [facility])   [facility] at [facility], Meda Coffee, MD   1 year ago Encounter for Medicare annual wellness exam   [facility] at [facility], Meda Coffee, MD   1 year ago Type 2 diabetes mellitus with other specified complication, unspecified whether long term insulin use [facility])   [facility] at [facility], Manus Rudd, MD      Future Appointments            In 2 weeks Myles Lipps, MD Primary Care at [place], [facility]

## 2020-02-28 ENCOUNTER — Ambulatory Visit (INDEPENDENT_AMBULATORY_CARE_PROVIDER_SITE_OTHER): Payer: Medicare Other | Admitting: Family Medicine

## 2020-02-28 VITALS — BP 139/82 | Ht 66.0 in | Wt 230.0 lb

## 2020-02-28 DIAGNOSIS — Z0000 Encounter for general adult medical examination without abnormal findings: Secondary | ICD-10-CM | POA: Diagnosis not present

## 2020-02-28 NOTE — Patient Instructions (Addendum)
Thank you for taking time to come for your Medicare Wellness Visit. I appreciate your ongoing commitment to your health goals. Please review the following plan we discussed and let me know if I can assist you in the future.  Leroy Kennedy LPN  Preventive Care 79 Years and Older, Male Preventive care refers to lifestyle choices and visits with your health care provider that can promote health and wellness. This includes:  A yearly physical exam. This is also called an annual well check.  Regular dental and eye exams.  Immunizations.  Screening for certain conditions.  Healthy lifestyle choices, such as diet and exercise. What can I expect for my preventive care visit? Physical exam Your health care provider will check:  Height and weight. These may be used to calculate body mass index (BMI), which is a measurement that tells if you are at a healthy weight.  Heart rate and blood pressure.  Your skin for abnormal spots. Counseling Your health care provider may ask you questions about:  Alcohol, tobacco, and drug use.  Emotional well-being.  Home and relationship well-being.  Sexual activity.  Eating habits.  History of falls.  Memory and ability to understand (cognition).  Work and work Statistician. What immunizations do I need?  Influenza (flu) vaccine  This is recommended every year. Tetanus, diphtheria, and pertussis (Tdap) vaccine  You may need a Td booster every 10 years. Varicella (chickenpox) vaccine  You may need this vaccine if you have not already been vaccinated. Zoster (shingles) vaccine  You may need this after age 66. Pneumococcal conjugate (PCV13) vaccine  One dose is recommended after age 79. Pneumococcal polysaccharide (PPSV23) vaccine  One dose is recommended after age 79. Measles, mumps, and rubella (MMR) vaccine  You may need at least one dose of MMR if you were born in 1957 or later. You may also need a second dose. Meningococcal  conjugate (MenACWY) vaccine  You may need this if you have certain conditions. Hepatitis A vaccine  You may need this if you have certain conditions or if you travel or work in places where you may be exposed to hepatitis A. Hepatitis B vaccine  You may need this if you have certain conditions or if you travel or work in places where you may be exposed to hepatitis B. Haemophilus influenzae type b (Hib) vaccine  You may need this if you have certain conditions. You may receive vaccines as individual doses or as more than one vaccine together in one shot (combination vaccines). Talk with your health care provider about the risks and benefits of combination vaccines. What tests do I need? Blood tests  Lipid and cholesterol levels. These may be checked every 5 years, or more frequently depending on your overall health.  Hepatitis C test.  Hepatitis B test. Screening  Lung cancer screening. You may have this screening every year starting at age 79 if you have a 30-pack-year history of smoking and currently smoke or have quit within the past 15 years.  Colorectal cancer screening. All adults should have this screening starting at age 79 and continuing until age 79. Your health care provider may recommend screening at age 57 if you are at increased risk. You will have tests every 1-10 years, depending on your results and the type of screening test.  Prostate cancer screening. Recommendations will vary depending on your family history and other risks.  Diabetes screening. This is done by checking your blood sugar (glucose) after you have not eaten for  a while (fasting). You may have this done every 1-3 years.  Abdominal aortic aneurysm (AAA) screening. You may need this if you are a current or former smoker.  Sexually transmitted disease (STD) testing. Follow these instructions at home: Eating and drinking  Eat a diet that includes fresh fruits and vegetables, whole grains, lean  protein, and low-fat dairy products. Limit your intake of foods with high amounts of sugar, saturated fats, and salt.  Take vitamin and mineral supplements as recommended by your health care provider.  Do not drink alcohol if your health care provider tells you not to drink.  If you drink alcohol: ? Limit how much you have to 0-2 drinks a day. ? Be aware of how much alcohol is in your drink. In the [place] one drink equals one 12 oz bottle of beer (355 mL), one 5 oz glass of wine (148 mL), or one 1 oz glass of hard liquor (44 mL). Lifestyle  Take daily care of your teeth and gums.  Stay active. Exercise for at least 30 minutes on 5 or more days each week.  Do not use any products that contain nicotine or tobacco, such as cigarettes, e-cigarettes, and chewing tobacco. If you need help quitting, ask your health care provider.  If you are sexually active, practice safe sex. Use a condom or other form of protection to prevent STIs (sexually transmitted infections).  Talk with your health care provider about taking a low-dose aspirin or statin. What's next?  Visit your health care provider once a year for a well check visit.  Ask your health care provider how often you should have your eyes and teeth checked.  Stay up to date on all vaccines. This information is not intended to replace advice given to you by your health care provider. Make sure you discuss any questions you have with your health care provider. Document Revised: 10/08/2018 Document Reviewed: 10/08/2018 Elsevier Patient Education  2020 [employer]  
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
## 2020-03-14 ENCOUNTER — Encounter: Payer: Self-pay | Admitting: Family Medicine

## 2020-03-14 ENCOUNTER — Other Ambulatory Visit: Payer: Self-pay

## 2020-03-14 ENCOUNTER — Ambulatory Visit (INDEPENDENT_AMBULATORY_CARE_PROVIDER_SITE_OTHER): Payer: Medicare Other | Admitting: Family Medicine

## 2020-03-14 VITALS — BP 130/84 | HR 92 | Temp 97.8°F | Resp 15 | Ht 66.0 in | Wt 235.0 lb

## 2020-03-14 DIAGNOSIS — I10 Essential (primary) hypertension: Secondary | ICD-10-CM | POA: Diagnosis not present

## 2020-03-14 DIAGNOSIS — L03115 Cellulitis of right lower limb: Secondary | ICD-10-CM | POA: Diagnosis not present

## 2020-03-14 DIAGNOSIS — I83019 Varicose veins of right lower extremity with ulcer of unspecified site: Secondary | ICD-10-CM

## 2020-03-14 DIAGNOSIS — E782 Mixed hyperlipidemia: Secondary | ICD-10-CM

## 2020-03-14 DIAGNOSIS — E1159 Type 2 diabetes mellitus with other circulatory complications: Secondary | ICD-10-CM | POA: Diagnosis not present

## 2020-03-14 DIAGNOSIS — L97919 Non-pressure chronic ulcer of unspecified part of right lower leg with unspecified severity: Secondary | ICD-10-CM | POA: Diagnosis not present

## 2020-03-14 MED ORDER — CEPHALEXIN 500 MG PO CAPS
500.0000 mg | ORAL_CAPSULE | Freq: Four times a day (QID) | ORAL | 0 refills | Status: DC
Start: 2020-03-14 — End: 2020-06-06

## 2020-03-14 MED ORDER — GLIPIZIDE ER 10 MG PO TB24: 10.0000 mg | ORAL_TABLET | Freq: Two times a day (BID) | ORAL | 1 refills | Status: DC

## 2020-03-14 MED ORDER — TRAMADOL HCL 50 MG PO TABS: 50.0000 mg | ORAL_TABLET | Freq: Four times a day (QID) | ORAL | 0 refills | Status: DC | PRN
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
## 2020-03-14 NOTE — Patient Instructions (Signed)
° ° ° °  If you have lab work done today you will be contacted with your lab results within the next 2 weeks.  If you have not heard from us then please contact us. The fastest way to get your results is to register for My Chart. ° ° °IF you received an x-ray today, you will receive an invoice from [facility]. Please contact  Radiology at [phone] with questions or concerns regarding your invoice.  ° °IF you received labwork today, you will receive an invoice from [facility]. Please contact [facility] at [phone] with questions or concerns regarding your invoice.  ° °Our billing staff will not be able to assist you with questions regarding bills from these companies. ° °You will be contacted with the lab results as soon as they are available. The fastest way to get your results is to activate your My Chart account. Instructions are located on the last page of this paperwork. If you have not heard from us regarding the results in 2 weeks, please contact this office. °  ° ° ° °

## 2020-03-15 LAB — CMP14+EGFR
ALT: 17 IU/L (ref 0–44)
AST: 18 IU/L (ref 0–40)
Albumin/Globulin Ratio: 2.1 (ref 1.2–2.2)
Albumin: 4.5 g/dL (ref 3.7–4.7)
Alkaline Phosphatase: 86 IU/L (ref 48–121)
BUN/Creatinine Ratio: 18 (ref 10–24)
BUN: 16 mg/dL (ref 8–27)
Bilirubin Total: 0.5 mg/dL (ref 0.0–1.2)
CO2: 23 mmol/L (ref 20–29)
Calcium: 9.8 mg/dL (ref 8.6–10.2)
Chloride: 101 mmol/L (ref 96–106)
Creatinine, Ser: 0.9 mg/dL (ref 0.76–1.27)
GFR calc Af Amer: 94 mL/min/{1.73_m2} (ref >59)
GFR calc non Af Amer: 82 mL/min/{1.73_m2} (ref >59)
Globulin, Total: 2.1 g/dL (ref 1.5–4.5)
Glucose: 176 mg/dL — ABNORMAL HIGH (ref 65–99)
Potassium: 3.5 mmol/L (ref 3.5–5.2)
Sodium: 142 mmol/L (ref 134–144)
Total Protein: 6.6 g/dL (ref 6.0–8.5)

## 2020-03-15 LAB — LIPID PANEL
Chol/HDL Ratio: 3.6 ratio (ref 0.0–5.0)
Cholesterol, Total: 152 mg/dL (ref 100–199)
HDL: 42 mg/dL (ref >39)
LDL Chol Calc (NIH): 89 mg/dL (ref 0–99)
Triglycerides: 117 mg/dL (ref 0–149)
VLDL Cholesterol Cal: 21 mg/dL (ref 5–40)

## 2020-03-15 LAB — MICROALBUMIN / CREATININE URINE RATIO
Creatinine, Urine: 59.2 mg/dL
Microalb/Creat Ratio: 17 mg/g creat (ref 0–29)
Microalbumin, Urine: 10 ug/mL

## 2020-03-15 LAB — HEMOGLOBIN A1C
Est. average glucose Bld gHb Est-mCnc: 169 mg/dL
Hgb A1c MFr Bld: 7.5 % — ABNORMAL HIGH (ref 4.8–5.6)

## 2020-03-21 ENCOUNTER — Ambulatory Visit (INDEPENDENT_AMBULATORY_CARE_PROVIDER_SITE_OTHER): Payer: Medicare Other | Admitting: Family Medicine

## 2020-03-21 ENCOUNTER — Encounter: Payer: Self-pay | Admitting: Family Medicine

## 2020-03-21 ENCOUNTER — Other Ambulatory Visit: Payer: Self-pay

## 2020-03-21 VITALS — BP 138/79 | HR 62 | Temp 98.0°F | Ht 66.0 in | Wt 234.0 lb

## 2020-03-21 DIAGNOSIS — I83019 Varicose veins of right lower extremity with ulcer of unspecified site: Secondary | ICD-10-CM | POA: Diagnosis not present

## 2020-03-21 DIAGNOSIS — L03115 Cellulitis of right lower limb: Secondary | ICD-10-CM

## 2020-03-21 DIAGNOSIS — L97919 Non-pressure chronic ulcer of unspecified part of right lower leg with unspecified severity: Secondary | ICD-10-CM | POA: Diagnosis not present

## 2020-03-21 MED ORDER — MUPIROCIN 2 % EX OINT
1.0000 | TOPICAL_OINTMENT | Freq: Three times a day (TID) | CUTANEOUS | 1 refills | Status: DC
Start: 2020-03-21 — End: 2020-06-06

## 2020-03-21 NOTE — Patient Instructions (Signed)
° ° ° °  If you have lab work done today you will be contacted with your lab results within the next 2 weeks.  If you have not heard from us then please contact us. The fastest way to get your results is to register for My Chart. ° ° °IF you received an x-ray today, you will receive an invoice from [facility]. Please contact [facility] at [phone] with questions or concerns regarding your invoice.  ° °IF you received labwork today, you will receive an invoice from [facility]. Please contact [facility] at [phone] with questions or concerns regarding your invoice.  ° °Our billing staff will not be able to assist you with questions regarding bills from these companies. ° °You will be contacted with the lab results as soon as they are available. The fastest way to get your results is to activate your My Chart account. Instructions are located on the last page of this paperwork. If you have not heard from us regarding the results in 2 weeks, please contact this office. °  ° ° ° °
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
## 2020-03-31 ENCOUNTER — Telehealth: Payer: Self-pay | Admitting: Family Medicine

## 2020-03-31 NOTE — Telephone Encounter (Signed)
03/31/2020 - I RECEIVED A PHONE CALL FROM TONI AT THE [facility]. SHE STATED THEY HAD A REFERRAL TO SEE MR. Lorenson. WHEN SHE TRIED TO CALL HIM TO SCHEDULE AN APPOINTMENT HE TOLD HER DR. IRMA TOLD HIM HE DID NOT HAVE TO GO ANYMORE. SHE JUST WANTED TO CALL OUR OFFICE TO VERIFY. I CHECKED WITH NIKKI (DR. Darrell Jewel CMA) AND SHE READ THE CHART. DR. Darcel Bayley PUT THAT HE DOES NEED TO KEEP HIS APPOINTMENT WITH THE [facility]. TONI WILL CALL HIM BACK TO SCHEDULE. IF QUESTIONS TONI'S PHONE NUMBER IS [phone]  MBC

## 2020-04-03 ENCOUNTER — Encounter (HOSPITAL_BASED_OUTPATIENT_CLINIC_OR_DEPARTMENT_OTHER): Payer: Medicare Other | Attending: Internal Medicine | Admitting: Internal Medicine

## 2020-04-03 DIAGNOSIS — Z833 Family history of diabetes mellitus: Secondary | ICD-10-CM | POA: Diagnosis not present

## 2020-04-03 DIAGNOSIS — I10 Essential (primary) hypertension: Secondary | ICD-10-CM | POA: Diagnosis not present

## 2020-04-03 DIAGNOSIS — Z87891 Personal history of nicotine dependence: Secondary | ICD-10-CM | POA: Diagnosis not present

## 2020-04-03 DIAGNOSIS — E11621 Type 2 diabetes mellitus with foot ulcer: Secondary | ICD-10-CM | POA: Insufficient documentation

## 2020-04-03 DIAGNOSIS — E11622 Type 2 diabetes mellitus with other skin ulcer: Secondary | ICD-10-CM | POA: Insufficient documentation

## 2020-04-03 DIAGNOSIS — Z809 Family history of malignant neoplasm, unspecified: Secondary | ICD-10-CM | POA: Diagnosis not present

## 2020-04-03 DIAGNOSIS — Z885 Allergy status to narcotic agent status: Secondary | ICD-10-CM | POA: Diagnosis not present

## 2020-04-03 DIAGNOSIS — E1151 Type 2 diabetes mellitus with diabetic peripheral angiopathy without gangrene: Secondary | ICD-10-CM | POA: Diagnosis not present

## 2020-04-03 DIAGNOSIS — I87331 Chronic venous hypertension (idiopathic) with ulcer and inflammation of right lower extremity: Secondary | ICD-10-CM | POA: Diagnosis not present

## 2020-04-03 DIAGNOSIS — I872 Venous insufficiency (chronic) (peripheral): Secondary | ICD-10-CM | POA: Insufficient documentation

## 2020-04-03 DIAGNOSIS — E785 Hyperlipidemia, unspecified: Secondary | ICD-10-CM | POA: Insufficient documentation

## 2020-04-03 DIAGNOSIS — L97812 Non-pressure chronic ulcer of other part of right lower leg with fat layer exposed: Secondary | ICD-10-CM | POA: Insufficient documentation

## 2020-04-04 NOTE — Progress Notes (Signed)
Kevin Weaver, Kevin Weaver ([id number]) Visit Report for 04/03/2020 Abuse/Suicide Risk Screen Details Patient Name: Date of Service: [name], [name] 04/03/2020 9:00 A M Medical Record Number: [id number] Patient Account Number: [id number] Date of Birth/Sex: Treating RN: 1940/12/09 (78 y.o. [name]) Carlene Coria Primary Care Esparanza Krider: [name] Other Clinician: Referring Nysha Koplin: Treating Aariona Momon/Extender: [name], Benay Spice Weeks in Treatment: 0 Abuse/Suicide Risk Screen Items Answer ABUSE RISK SCREEN: Has anyone close to you tried to hurt or harm you recentlyo No Do you feel uncomfortable with anyone in your familyo No Has anyone forced you do things that you didnt want to doo No Electronic Signature(s) Signed: 04/04/2020 4:34:33 PM By: Carlene Coria RN Entered By: Carlene Coria on 04/03/2020 09:09:41 -------------------------------------------------------------------------------- Activities of Daily Living Details Patient Name: Date of Service: [name], [name] 04/03/2020 9:00 A M Medical Record Number: [id number] Patient Account Number: [id number] Date of Birth/Sex: Treating RN: [birth year] (79 y.o. [name]) Carlene Coria Primary Care Tasnia Spegal: [name] Other Clinician: Referring Soham Hollett: Treating Lezlee Gills/Extender: [name], [name] in Treatment: 0 Activities of Daily Living Items Answer Activities of Daily Living (Please select one for each item) Drive Automobile Completely Able T Medications ake Completely Able Use T elephone Completely Able Care for Appearance Completely Able Use T oilet Completely Able Bath / Shower Completely Able Dress Self Completely Able Feed Self Completely Able Walk Completely Able Get In / Out Bed Completely Able Housework Completely Able Prepare Meals Completely Able Handle Money Completely Able Shop for Self Completely Able Electronic Signature(s) Signed: 04/04/2020 4:34:33 PM By: Carlene Coria RN Entered By: Carlene Coria on 04/03/2020 09:10:14 -------------------------------------------------------------------------------- Education Screening Details Patient Name: Date of Service: [name], [name] 04/03/2020 9:00 A M Medical Record Number: [id number] Patient Account Number: [id number] Date of Birth/Sex: Treating RN: 1941/07/11 (79 y.o. [name]) Carlene Coria Primary Care Alassane Kalafut: [name] Other Clinician: Referring Avyaan Summer: Treating Keagan Anthis/Extender: [name] in Treatment: 0 Primary Learner Assessed: Patient Learning Preferences/Education Level/Primary Language Learning Preference: Explanation Highest Education Level: Grade School Preferred Language: English Cognitive Barrier Language Barrier: No Translator Needed: No Memory Deficit: No Emotional Barrier: No Cultural/Religious Beliefs Affecting Medical Care: No Physical Barrier Impaired Vision: No Impaired Hearing: No Decreased Hand dexterity: No Knowledge/Comprehension Knowledge Level: Medium Comprehension Level: High Ability to understand written instructions: High Ability to understand verbal instructions: High Motivation Anxiety Level: Anxious Cooperation: Cooperative Education Importance: Acknowledges Need Interest in Health Problems: Asks Questions Perception: Coherent Willingness to Engage in Self-Management High Activities: Readiness to Engage in Self-Management High Activities: Electronic Signature(s) Signed: 04/04/2020 4:34:33 PM By: Carlene Coria RN Entered By: Carlene Coria on 04/03/2020 09:10:40 -------------------------------------------------------------------------------- Fall Risk Assessment Details Patient Name: Date of Service: [name], [name] 04/03/2020 9:00 A M Medical Record Number: [id number] Patient Account Number: [id number] Date of Birth/Sex: Treating RN: 04/08/1941 (79 y.o. [name]) Carlene Coria Primary Care Keinan Brouillet: [name] Other Clinician: Referring  Marsh Heckler: Treating Crystel Demarco/Extender: [name], [name] in Treatment: 0 Fall Risk Assessment Items Have you had 2 or more falls in the last 12 monthso 0 No Have you had any fall that resulted in injury in the last 12 monthso 0 No FALLS RISK SCREEN History of falling - immediate or within 3 months 0 No Secondary diagnosis (Do you have 2 or more medical diagnoseso) 0 No Ambulatory aid None/bed rest/wheelchair/nurse 0 No Crutches/cane/walker 0 No Furniture 0 No Intravenous therapy Access/Saline/Heparin Lock 0 No Gait/Transferring Normal/ bed rest/ wheelchair  0 No Weak (short steps with or without shuffle, stooped but able to lift head while walking, may seek 0 No support from furniture) Impaired (short steps with shuffle, may have difficulty arising from chair, head down, impaired 0 No balance) Mental Status Oriented to own ability 0 No Electronic Signature(s) Signed: 04/04/2020 4:34:33 PM By: Yevonne Pax RN Entered By: Yevonne Pax on 04/03/2020 09:10:50 -------------------------------------------------------------------------------- Foot Assessment Details Patient Name: Date of Service: [name], [name] 04/03/2020 9:00 A M Medical Record Number: [id number] Patient Account Number: [id number] Date of Birth/Sex: Treating RN: [birth year] (78 y.o. Judie Petit) Yevonne Pax Primary Care Giovany Cosby: Koren Shiver Other Clinician: Referring Benancio Osmundson: Treating Rishita Petron/Extender: Gerome Apley, Darcel Bayley Weeks in Treatment: 0 Foot Assessment Items Site Locations + = Sensation present, - = Sensation absent, C = Callus, U = Ulcer R = Redness, W = Warmth, M = Maceration, PU = Pre-ulcerative lesion F = Fissure, S = Swelling, D = Dryness Assessment Right: Left: Other Deformity: No No Prior Foot Ulcer: No No Prior Amputation: No No Charcot Joint: No No Ambulatory Status: Ambulatory Without Help Gait: Steady Electronic Signature(s) Signed: 04/04/2020 4:34:33 PM By:  Yevonne Pax RN Entered By: Yevonne Pax on 04/03/2020 09:18:51 -------------------------------------------------------------------------------- Nutrition Risk Screening Details Patient Name: Date of Service: [name], [name] 04/03/2020 9:00 A M Medical Record Number: [id number] Patient Account Number: [id number] Date of Birth/Sex: Treating RN: 18-Nov-1940 (78 y.o. Judie Petit) Yevonne Pax Primary Care Luanne Krzyzanowski: Koren Shiver Other Clinician: Referring Nazim Kadlec: Treating Morrie Daywalt/Extender: Gerome Apley, Irma Weeks in Treatment: 0 Height (in): 66 Weight (lbs): 232 Body Mass Index (BMI): 37.4 Nutrition Risk Screening Items Score Screening NUTRITION RISK SCREEN: I have an illness or condition that made me change the kind and/or amount of food I eat 0 No I eat fewer than two meals per day 0 No I eat few fruits and vegetables, or milk products 0 No I have three or more drinks of beer, liquor or wine almost every day 0 No I have tooth or mouth problems that make it hard for me to eat 0 No I don't always have enough money to buy the food I need 0 No I eat alone most of the time 1 Yes I take three or more different prescribed or over-the-counter drugs a day 1 Yes Without wanting to, I have lost or gained 10 pounds in the last six months 2 Yes I am not always physically able to shop, cook and/or feed myself 0 No Nutrition Protocols Good Risk Protocol Moderate Risk Protocol 0 Provide education on nutrition High Risk Proctocol Risk Level: Moderate Risk Score: 4 Electronic Signature(s) Signed: 04/04/2020 4:34:33 PM By: Yevonne Pax RN Entered By: Yevonne Pax on 04/03/2020 09:11:11

## 2020-04-04 NOTE — Progress Notes (Signed)
BUREN, HAVEY ([id number]) Visit Report for 04/03/2020 Allergy List Details Patient Name: Date of Service: [name], [name] 04/03/2020 9:00 A M Medical Record Number: [id number] Patient Account Number: [id number] Date of Birth/Sex: Treating RN: 08-May-1941 (78 y.o. Kevin Weaver) Carlene Coria Primary Care Draco Malczewski: [name] Other Clinician: Referring Rashawn Rolon: Treating Jailin Moomaw/Extender: [name], Benay Spice Weeks in Treatment: 0 Allergies Active Allergies codeine Allergy Notes Electronic Signature(s) Signed: 04/04/2020 4:34:33 PM By: Carlene Coria RN Entered By: Carlene Coria on 04/03/2020 09:04:54 -------------------------------------------------------------------------------- Arrival Information Details Patient Name: Date of Service: [name], [name] 04/03/2020 9:00 A M Medical Record Number: [id number] Patient Account Number: [id number] Date of Birth/Sex: Treating RN: July 02, 1941 (78 y.o. Kevin Weaver Primary Care Sir Mallis: [name] Other Clinician: Referring Dnaiel Voller: Treating Kijana Estock/Extender: [name] in Treatment: 0 Visit Information Patient Arrived: Ambulatory Arrival Time: 08:51 Accompanied By: self Transfer Assistance: None Patient Identification Verified: Yes Secondary Verification Process Completed: Yes Patient Requires Transmission-Based Precautions: No Patient Has Alerts: Yes Patient Alerts: R ABI: 1.07 TBI: 0.38 L ABI:1.11 TBI: 0.65 July 2020 Electronic Signature(s) Signed: 04/03/2020 6:28:13 PM By: [name] RN, BSN Entered By: [name] on 04/03/2020 09:40:28 -------------------------------------------------------------------------------- Clinic Level of Care Assessment Details Patient Name: Date of Service: [name], [name] 04/03/2020 9:00 A M Medical Record Number: [id number] Patient Account Number: [id number] Date of Birth/Sex: Treating RN: 1940-12-22 (79 y.o. Kevin Weaver Primary Care  Alexius Ellington: [name] Other Clinician: Referring Tamya Denardo: Treating Abrham Maslowski/Extender: [name] in Treatment: 0 Clinic Level of Care Assessment Items TOOL 1 Quantity Score X- 1 0 Use when EandM and Procedure is performed on INITIAL visit ASSESSMENTS - Nursing Assessment / Reassessment X- 1 20 General Physical Exam (combine w/ comprehensive assessment (listed just below) when performed on new pt. evals) X- 1 25 Comprehensive Assessment (HX, ROS, Risk Assessments, Wounds Hx, etc.) ASSESSMENTS - Wound and Skin Assessment / Reassessment []  - 0 Dermatologic / Skin Assessment (not related to wound area) ASSESSMENTS - Ostomy and/or Continence Assessment and Care []  - 0 Incontinence Assessment and Management []  - 0 Ostomy Care Assessment and Management (repouching, etc.) PROCESS - Coordination of Care X - Simple Patient / Family Education for ongoing care 1 15 []  - 0 Complex (extensive) Patient / Family Education for ongoing care X- 1 10 Staff obtains Programmer, systems, Records, T Results / Process Orders est []  - 0 Staff telephones HHA, Nursing Homes / Clarify orders / etc []  - 0 Routine Transfer to another Facility (non-emergent condition) []  - 0 Routine Hospital Admission (non-emergent condition) X- 1 15 New Admissions / Biomedical engineer / Ordering NPWT Apligraf, etc. , []  - 0 Emergency Hospital Admission (emergent condition) PROCESS - Special Needs []  - 0 Pediatric / Minor Patient Management []  - 0 Isolation Patient Management []  - 0 Hearing / Language / Visual special needs []  - 0 Assessment of Community assistance (transportation, D/C planning, etc.) []  - 0 Additional assistance / Altered mentation []  - 0 Support Surface(s) Assessment (bed, cushion, seat, etc.) INTERVENTIONS - Miscellaneous []  - 0 External ear exam []  - 0 Patient Transfer (multiple staff / Civil Service fast streamer / Similar devices) []  - 0 Simple Staple / Suture removal (25 or  less) []  - 0 Complex Staple / Suture removal (26 or more) []  - 0 Hypo/Hyperglycemic Management (do not check if billed separately) []  - 0 Ankle / Brachial Index (ABI) - do not check if billed separately Has the patient been  seen at the hospital within the last three years: Yes Total Score: 85 Level Of Care: New/Established - Level 3 Electronic Signature(s) Signed: 04/03/2020 6:28:13 PM By: Zandra Abts RN, BSN Entered By: Zandra Abts on 04/03/2020 10:09:09 -------------------------------------------------------------------------------- Compression Therapy Details Patient Name: Date of Service: [name], [name] 04/03/2020 9:00 A M Medical Record Number: [id number] Patient Account Number: [id number] Date of Birth/Sex: Treating RN: 1941/01/13 (79 y.o. Kevin Weaver Primary Care Sharlyne Koeneman: Koren Shiver Other Clinician: Referring Nyaisha Simao: Treating Rosanne Wohlfarth/Extender: Melynda Ripple in Treatment: 0 Compression Therapy Performed for Wound Assessment: Wound #1 Right,Lateral Lower Leg Performed By: Clinician Zandra Abts, RN Compression Type: Three Layer Post Procedure Diagnosis Same as Pre-procedure Electronic Signature(s) Signed: 04/03/2020 6:28:13 PM By: Zandra Abts RN, BSN Entered By: Zandra Abts on 04/03/2020 09:43:52 -------------------------------------------------------------------------------- Encounter Discharge Information Details Patient Name: Date of Service: [name], [name] 04/03/2020 9:00 A M Medical Record Number: [id number] Patient Account Number: [id number] Date of Birth/Sex: Treating RN: 1940-12-09 (79 y.o. Kevin Weaver Primary Care Mathea Frieling: Koren Shiver Other Clinician: Referring Duvid Smalls: Treating Grissel Tyrell/Extender: Melynda Ripple in Treatment: 0 Encounter Discharge Information Items Post Procedure Vitals Discharge Condition: Stable Temperature (F): 98.4 Ambulatory Status:  Ambulatory Pulse (bpm): 78 Discharge Destination: Home Respiratory Rate (breaths/min): 18 Transportation: Private Auto Blood Pressure (mmHg): 179/97 Accompanied By: self Schedule Follow-up Appointment: Yes Clinical Summary of Care: Electronic Signature(s) Signed: 04/03/2020 5:09:44 PM By: Shawn Stall Entered By: Shawn Stall on 04/03/2020 10:29:49 -------------------------------------------------------------------------------- Lower Extremity Assessment Details Patient Name: Date of Service: [name], [name] 04/03/2020 9:00 A M Medical Record Number: [id number] Patient Account Number: [id number] Date of Birth/Sex: Treating RN: 01-Nov-1940 (78 y.o. Kevin Weaver) Yevonne Pax Primary Care Shimeka Bacot: Koren Shiver Other Clinician: Referring Ephriam Turman: Treating Evey Mcmahan/Extender: Gerome Apley, Darcel Bayley Weeks in Treatment: 0 Edema Assessment Assessed: Kyra Searles: No] [Right: No] E[Left: dema] [Right: :] Calf Left: Right: Point of Measurement: 42 cm From Medial Instep cm 39 cm Ankle Left: Right: Point of Measurement: 11 cm From Medial Instep cm 28 cm Electronic Signature(s) Signed: 04/04/2020 4:34:33 PM By: Yevonne Pax RN Entered By: Yevonne Pax on 04/03/2020 09:19:32 -------------------------------------------------------------------------------- Multi Wound Chart Details Patient Name: Date of Service: [name], [name] 04/03/2020 9:00 A M Medical Record Number: [id number] Patient Account Number: [id number] Date of Birth/Sex: Treating RN: [birth year] (79 y.o. Kevin Weaver Primary Care Iyahna Obriant: Koren Shiver Other Clinician: Referring Alysandra Lobue: Treating Traye Bates/Extender: Gerome Apley, Ruben Reason in Treatment: 0 Vital Signs Height(in): 66 Pulse(bpm): 78 Weight(lbs): 232 Blood Pressure(mmHg): 179/97 Body Mass Index(BMI): 37 Temperature(F): 98.4 Respiratory Rate(breaths/min): 18 Photos: [1:No Photos Right, Lateral Lower Leg] [N/A:N/A N/A] Wound  Location: [1:Gradually Appeared] [N/A:N/A] Wounding Event: [1:Venous Leg Ulcer] [N/A:N/A] Primary Etiology: [1:Hypertension, Type II Diabetes] [N/A:N/A] Comorbid History: [1:12/27/2019] [N/A:N/A] Date Acquired: [1:0] [N/A:N/A] Weeks of Treatment: [1:Open] [N/A:N/A] Wound Status: [1:Yes] [N/A:N/A] Clustered Wound: [1:3] [N/A:N/A] Clustered Quantity: [1:4.5x4.5x0.1] [N/A:N/A] Measurements L x W x D (cm) [1:15.904] [N/A:N/A] A (cm) : rea [1:1.59] [N/A:N/A] Volume (cm) : [1:0.00%] [N/A:N/A] % Reduction in A rea: [1:0.00%] [N/A:N/A] % Reduction in Volume: [1:Full Thickness Without Exposed] [N/A:N/A] Classification: [1:Support Structures Medium] [N/A:N/A] Exudate A mount: [1:Serosanguineous] [N/A:N/A] Exudate Type: [1:red, brown] [N/A:N/A] Exudate Color: [1:Flat and Intact] [N/A:N/A] Wound Margin: [1:Medium (34-66%)] [N/A:N/A] Granulation A mount: [1:Red] [N/A:N/A] Granulation Quality: [1:Medium (34-66%)] [N/A:N/A] Necrotic A mount: [1:Fat Layer (Subcutaneous Tissue)] [N/A:N/A] Exposed Structures: [1:Exposed: Yes Fascia: No Tendon: No Muscle: No Joint: No Bone: No None] [N/A:N/A]  Epithelialization: [1:Debridement - Excisional] [N/A:N/A] Debridement: Pre-procedure Verification/Time Out 09:40 [N/A:N/A] Taken: [1:Other] [N/A:N/A] Pain Control: [1:Subcutaneous, Slough] [N/A:N/A] Tissue Debrided: [1:Skin/Subcutaneous Tissue] [N/A:N/A] Level: [1:20.25] [N/A:N/A] Debridement A (sq cm): [1:rea Curette] [N/A:N/A] Instrument: [1:Minimum] [N/A:N/A] Bleeding: [1:Pressure] [N/A:N/A] Hemostasis A chieved: [1:4] [N/A:N/A] Procedural Pain: [1:2] [N/A:N/A] Post Procedural Pain: [1:Procedure was tolerated well] [N/A:N/A] Debridement Treatment Response: [1:4.5x4.5x0.1] [N/A:N/A] Post Debridement Measurements L x W x D (cm) [1:1.59] [N/A:N/A] Post Debridement Volume: (cm) [1:Compression Therapy] [N/A:N/A] Procedures Performed: [1:Debridement] Treatment Notes Electronic Signature(s) Signed:  04/03/2020 6:28:13 PM By: Zandra Abts RN, BSN Signed: 04/03/2020 8:16:21 PM By: Baltazar Najjar MD Entered By: Baltazar Najjar on 04/03/2020 10:00:36 -------------------------------------------------------------------------------- Multi-Disciplinary Care Plan Details Patient Name: Date of Service: [name], [name] 04/03/2020 9:00 A M Medical Record Number: [id number] Patient Account Number: [id number] Date of Birth/Sex: Treating RN: May 01, 1941 (79 y.o. Kevin Weaver Primary Care Kelsey Durflinger: Koren Shiver Other Clinician: Referring Hassaan Crite: Treating Javonni Macke/Extender: Gerome Apley, Ruben Reason in Treatment: 0 Active Inactive Nutrition Nursing Diagnoses: Impaired glucose control: actual or potential Potential for alteratiion in Nutrition/Potential for imbalanced nutrition Goals: Patient/caregiver agrees to and verbalizes understanding of need to use nutritional supplements and/or vitamins as prescribed Date Initiated: 04/03/2020 Target Resolution Date: 04/28/2020 Goal Status: Active Patient/caregiver will maintain therapeutic glucose control Date Initiated: 04/03/2020 Target Resolution Date: 04/28/2020 Goal Status: Active Interventions: Assess HgA1c results as ordered upon admission and as needed Assess patient nutrition upon admission and as needed per policy Provide education on elevated blood sugars and impact on wound healing Provide education on nutrition Notes: Venous Leg Ulcer Nursing Diagnoses: Knowledge deficit related to disease process and management Potential for venous Insuffiency (use before diagnosis confirmed) Goals: Patient will maintain optimal edema control Date Initiated: 04/03/2020 Target Resolution Date: 04/28/2020 Goal Status: Active Patient/caregiver will verbalize understanding of disease process and disease management Date Initiated: 04/03/2020 Target Resolution Date: 04/28/2020 Goal Status: Active Interventions: Assess peripheral edema status  every visit. Compression as ordered Provide education on venous insufficiency Notes: Wound/Skin Impairment Nursing Diagnoses: Impaired tissue integrity Knowledge deficit related to ulceration/compromised skin integrity Goals: Patient/caregiver will verbalize understanding of skin care regimen Date Initiated: 04/03/2020 Target Resolution Date: 04/28/2020 Goal Status: Active Ulcer/skin breakdown will have a volume reduction of 30% by week 4 Date Initiated: 04/03/2020 Target Resolution Date: 04/28/2020 Goal Status: Active Interventions: Assess patient/caregiver ability to obtain necessary supplies Assess patient/caregiver ability to perform ulcer/skin care regimen upon admission and as needed Assess ulceration(s) every visit Provide education on ulcer and skin care Notes: Electronic Signature(s) Signed: 04/03/2020 6:28:13 PM By: Zandra Abts RN, BSN Entered By: Zandra Abts on 04/03/2020 10:08:32 -------------------------------------------------------------------------------- Patient/Caregiver Education Details Patient Name: Date of Service: [name], [name] 6/7/2021andnbsp9:00 A M Medical Record Number: [id number] Patient Account Number: [id number] Date of Birth/Gender: Treating RN: 1941-06-27 (79 y.o. Kevin Weaver Primary Care Physician: Koren Shiver Other Clinician: Referring Physician: Treating Physician/Extender: Melynda Ripple in Treatment: 0 Education Assessment Education Provided To: Patient Education Topics Provided Elevated Blood Sugar/ Impact on Healing: Methods: Explain/Verbal Responses: State content correctly Nutrition: Methods: Explain/Verbal Responses: State content correctly Venous: Methods: Explain/Verbal Responses: State content correctly Wound/Skin Impairment: Methods: Explain/Verbal Responses: State content correctly Electronic Signature(s) Signed: 04/03/2020 6:28:13 PM By: Zandra Abts RN, BSN Entered By: Zandra Abts on 04/03/2020 10:08:47 -------------------------------------------------------------------------------- Wound Assessment Details Patient Name: Date of Service: [name], [name] 04/03/2020 9:00 A M Medical Record Number: [id number] Patient Account Number: [id number] Date of Birth/Sex: Treating RN:  17-Apr-1941 (78 y.o. Kevin Weaver Primary Care Penni Penado: Koren Shiver Other Clinician: Referring Dermot Gremillion: Treating Talitha Dicarlo/Extender: Gerome Apley, Darcel Bayley Weeks in Treatment: 0 Wound Status Wound Number: 1 Primary Etiology: Venous Leg Ulcer Wound Location: Right, Lateral Lower Leg Wound Status: Open Wounding Event: Gradually Appeared Comorbid History: Hypertension, Type II Diabetes Date Acquired: 12/27/2019 Weeks Of Treatment: 0 Clustered Wound: Yes Photos Photo Uploaded By: Benjaman Kindler on 04/04/2020 13:24:01 Wound Measurements Length: (cm) 4.5 Width: (cm) 4.5 Depth: (cm) 0.1 Clustered Quantity: 3 Area: (cm) 15.904 Volume: (cm) 1.59 % Reduction in Area: 0% % Reduction in Volume: 0% Epithelialization: None Tunneling: No Undermining: No Wound Description Classification: Full Thickness Without Exposed Support Structures Wound Margin: Flat and Intact Exudate Amount: Medium Exudate Type: Serosanguineous Exudate Color: red, brown Foul Odor After Cleansing: No Slough/Fibrino Yes Wound Bed Granulation Amount: Medium (34-66%) Exposed Structure Granulation Quality: Red Fascia Exposed: No Necrotic Amount: Medium (34-66%) Fat Layer (Subcutaneous Tissue) Exposed: Yes Necrotic Quality: Adherent Slough Tendon Exposed: No Muscle Exposed: No Joint Exposed: No Bone Exposed: No Treatment Notes Wound #1 (Right, Lateral Lower Leg) 1. Cleanse With Wound Cleanser Soap and water 2. Periwound Care Moisturizing lotion TCA Cream 3. Primary Dressing Applied Calcium Alginate Ag 4. Secondary Dressing ABD Pad Kerramax/Xtrasorb 6. Support Layer Applied 3  layer compression wrap Notes netting. explained the orders, dressings, how to care for dressing, and when to return to wound center. patient in agreement. Electronic Signature(s) Signed: 04/03/2020 6:28:13 PM By: Zandra Abts RN, BSN Signed: 04/04/2020 4:34:33 PM By: Yevonne Pax RN Entered By: Zandra Abts on 04/03/2020 09:38:09 -------------------------------------------------------------------------------- Vitals Details Patient Name: Date of Service: [name], [name] 04/03/2020 9:00 A M Medical Record Number: [id number] Patient Account Number: [id number] Date of Birth/Sex: Treating RN: 1941/02/09 (78 y.o. Kevin Weaver) Yevonne Pax Primary Care Laderius Valbuena: Koren Shiver Other Clinician: Referring Rosaura Bolon: Treating Takayla Baillie/Extender: Melynda Ripple in Treatment: 0 Vital Signs Time Taken: 09:03 Temperature (F): 98.4 Height (in): 66 Pulse (bpm): 78 Source: Stated Respiratory Rate (breaths/min): 18 Weight (lbs): 232 Blood Pressure (mmHg): 179/97 Source: Stated Reference Range: 80 - 120 mg / dl Body Mass Index (BMI): 37.4 Electronic Signature(s) Signed: 04/04/2020 4:34:33 PM By: Yevonne Pax RN Entered By: Yevonne Pax on 04/03/2020 09:04:20

## 2020-04-04 NOTE — Progress Notes (Signed)
BENJIE, RICKETSON ([id number]) Visit Report for 04/03/2020 Chief Complaint Document Details Patient Name: Date of Service: [name], [name] 04/03/2020 9:00 A M Medical Record Number: [id number] Patient Account Number: [id number] Date of Birth/Sex: Treating RN: 06-27-1941 (79 y.o. [name] Primary Care Provider: Koren Shiver Other Clinician: Referring Provider: Treating Provider/Extender: Gerome Apley, Ruben Reason in Treatment: 0 Information Obtained from: Patient Chief Complaint 04/03/20; patient is here for review of wounds on his right lateral lower leg Electronic Signature(s) Signed: 04/03/2020 8:16:21 PM By: Baltazar Najjar MD Entered By: Baltazar Najjar on 04/03/2020 10:01:06 -------------------------------------------------------------------------------- Debridement Details Patient Name: Date of Service: [name], [name] 04/03/2020 9:00 A M Medical Record Number: [id number] Patient Account Number: [id number] Date of Birth/Sex: Treating RN: 11-23-1940 (79 y.o. [name] Primary Care Provider: Koren Shiver Other Clinician: Referring Provider: Treating Provider/Extender: Melynda Ripple in Treatment: 0 Debridement Performed for Assessment: Wound #1 Right,Lateral Lower Leg Performed By: Physician Maxwell Caul., MD Debridement Type: Debridement Severity of Tissue Pre Debridement: Fat layer exposed Level of Consciousness (Pre-procedure): Awake and Alert Pre-procedure Verification/Time Out Yes - 09:40 Taken: Start Time: 09:40 Pain Control: Other : Benzocaine 20% T Area Debrided (L x W): otal 4.5 (cm) x 4.5 (cm) = 20.25 (cm) Tissue and other material debrided: Viable, Non-Viable, Eschar, Slough, Subcutaneous, Slough Level: Skin/Subcutaneous Tissue Debridement Description: Excisional Instrument: Curette Bleeding: Minimum Hemostasis Achieved: Pressure End Time: 09:43 Procedural Pain: 4 Post Procedural Pain:  2 Response to Treatment: Procedure was tolerated well Level of Consciousness (Post- Awake and Alert procedure): Post Debridement Measurements of Total Wound Length: (cm) 4.5 Width: (cm) 4.5 Depth: (cm) 0.1 Volume: (cm) 1.59 Character of Wound/Ulcer Post Debridement: Improved Severity of Tissue Post Debridement: Fat layer exposed Post Procedure Diagnosis Same as Pre-procedure Electronic Signature(s) Signed: 04/03/2020 6:28:13 PM By: Zandra Abts RN, BSN Signed: 04/03/2020 8:16:21 PM By: Baltazar Najjar MD Entered By: Zandra Abts on 04/03/2020 10:33:37 -------------------------------------------------------------------------------- HPI Details Patient Name: Date of Service: [name], [name] 04/03/2020 9:00 A M Medical Record Number: [id number] Patient Account Number: [id number] Date of Birth/Sex: Treating RN: 1941/08/26 (79 y.o. [name] Primary Care Provider: Koren Shiver Other Clinician: Referring Provider: Treating Provider/Extender: Melynda Ripple in Treatment: 0 History of Present Illness HPI Description: ADMISSION 04/03/20 This is a 79 year old man who states he has been dealing with blistering on his lower legs since Thanksgiving of last year and remaining wounds on the right lateral lower leg for the last several months. He says he has had a lot of pain to uncomfortable. He was seen by his primary doctor on 5/25 diagnosed with cellulitis and an ulcer on his right ankle who was given Keflex and some ointment but the patient does not remember what that was. He was felt to have a stasis ulcer. The patient had arterial studies done in July 2020. These showed an ABI of 1.07 on the right but a TBI of only 0.38 with monophasic waveforms. On the left he had an ABI of 1.11 with a TBI of 0.67 and triphasic waveforms. Past medical history includes type 2 diabetes, hypertension, hyperlipidemia, leg swelling, PAD, right foot trauma at age 26 where his  foot was caught in a lawn more. Left him with a permanent inversion at the right ankle. He had right arm trauma catching his hand in a piece of farm equipment and lost all of his fingers except his thumb and part of his  second finger. Electronic Signature(s) Signed: 04/03/2020 8:16:21 PM By: Baltazar Najjar MD Entered By: Baltazar Najjar on 04/03/2020 10:03:34 -------------------------------------------------------------------------------- Physical Exam Details Patient Name: Date of Service: [name], [name] 04/03/2020 9:00 A M Medical Record Number: [id number] Patient Account Number: [id number] Date of Birth/Sex: Treating RN: October 07, 1941 (79 y.o. [name] Primary Care Provider: Koren Shiver Other Clinician: Referring Provider: Treating Provider/Extender: Melynda Ripple in Treatment: 0 Constitutional Patient is hypertensive.. Pulse regular and within target range for patient.Marland Kitchen Respirations regular, non-labored and within target range.. Temperature is normal and within the target range for the patient.Marland Kitchen Appears in no distress. Eyes Conjunctivae clear. No discharge.no icterus. Respiratory work of breathing is normal. Bilateral breath sounds are clear and equal in all lobes with no wheezes, rales or rhonchi.. Cardiovascular 3 out of 6 short midsystolic murmur at the aortic area that does radiate. JVP not elevated. Popliteal pulses culpable on the right. Faint dorsalis pedis pulse in the right foot palpable on the left.. Pitting edema in the bilateral lower legs. Integumentary (Hair, Skin) Patient has chronic stasis dermatitis in the right anterior and lateral lower leg also the left leg notable. Psychiatric appears at normal baseline. Notes Wound exam; the patient's wound are on the right lateral lower leg. Nonviable eschar and skin removed from the circumference some debris over the surface hemostasis with direct pressure. He has erythema around the  wounds and into the anterior part of the right lower leg this is nontender not suggestive of cellulitis. He has edema but no calf tenderness. Also notably has very similar findings on the left leg Electronic Signature(s) Signed: 04/03/2020 8:16:21 PM By: Baltazar Najjar MD Entered By: Baltazar Najjar on 04/03/2020 10:06:32 -------------------------------------------------------------------------------- Physician Orders Details Patient Name: Date of Service: [name], [name] 04/03/2020 9:00 A M Medical Record Number: [id number] Patient Account Number: [id number] Date of Birth/Sex: Treating RN: [birth year] (78 y.o. [name] Primary Care Provider: Koren Shiver Other Clinician: Referring Provider: Treating Provider/Extender: Melynda Ripple in Treatment: 0 Verbal / Phone Orders: No Diagnosis Coding Follow-up Appointments Return Appointment in 1 week. Dressing Change Frequency Wound #1 Right,Lateral Lower Leg Do not change entire dressing for one week. Skin Barriers/Peri-Wound Care Wound #1 Right,Lateral Lower Leg Moisturizing lotion TCA Cream or Ointment - mixed with lotion Wound Cleansing May shower with protection. - may use cast protector Primary Wound Dressing Wound #1 Right,Lateral Lower Leg Calcium Alginate with Silver Secondary Dressing Wound #1 Right,Lateral Lower Leg ABD pad Zetuvit or Kerramax Edema Control 3 Layer Compression System - Right Lower Extremity Avoid standing for long periods of time Elevate legs to the level of the heart or above for 30 minutes daily and/or when sitting, a frequency of: - throughout the day Exercise regularly Services and Therapies Venous Studies -Bilateral reflux study - wound on right lower leg, bilateral leg swelling rterial Studies- Bilateral with ABIs and TBIs - Non healing ulcer on right lower leg, abnormal arterial study in 2020 A Electronic Signature(s) Signed: 04/03/2020 6:28:13 PM By: Zandra Abts RN, BSN Signed: 04/03/2020 8:16:21 PM By: Baltazar Najjar MD Entered By: Zandra Abts on 04/03/2020 09:45:17 -------------------------------------------------------------------------------- Problem List Details Patient Name: Date of Service: [name], [name] 04/03/2020 9:00 A M Medical Record Number: [id number] Patient Account Number: [id number] Date of Birth/Sex: Treating RN: 05/04/1941 (79 y.o. 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 04/03/2020 No Yes lower extremity L97.812 Non-pressure chronic ulcer of other part of right lower leg with fat layer 04/03/2020 No Yes exposed E11.622 Type 2 diabetes mellitus with other skin ulcer 04/03/2020 No Yes E11.51 Type 2 diabetes mellitus with diabetic peripheral angiopathy without gangrene 04/03/2020 No Yes Inactive Problems Resolved Problems Electronic Signature(s) Signed: 04/03/2020 8:16:21 PM By: Baltazar Najjar MD Entered By: Baltazar Najjar on 04/03/2020 09:52:50 -------------------------------------------------------------------------------- Progress Note Details Patient Name: Date of Service: [name], [name] 04/03/2020 9:00 A M Medical Record Number: [id number] Patient Account Number: [id number] Date of Birth/Sex: Treating RN: November 24, 1940 (80 y.o. [name] Primary Care Provider: Koren Shiver Other Clinician: Referring Provider: Treating Provider/Extender: Gerome Apley, Ruben Reason in Treatment: 0 Subjective Chief Complaint Information obtained from Patient 04/03/20; patient is here for review of wounds on his right lateral lower leg History of Present Illness (HPI) ADMISSION 04/03/20 This is a  79 year old man who states he has been dealing with blistering on his lower legs since Thanksgiving of last year and remaining wounds on the right lateral lower leg for the last several months. He says he has had a lot of pain to uncomfortable. He was seen by his primary doctor on 5/25 diagnosed with cellulitis and an ulcer on his right ankle who was given Keflex and some ointment but the patient does not remember what that was. He was felt to have a stasis ulcer. The patient had arterial studies done in July 2020. These showed an ABI of 1.07 on the right but a TBI of only 0.38 with monophasic waveforms. On the left he had an ABI of 1.11 with a TBI of 0.67 and triphasic waveforms. Past medical history includes type 2 diabetes, hypertension, hyperlipidemia, leg swelling, PAD, right foot trauma at age 20 where his foot was caught in a lawn more. Left him with a permanent inversion at the right ankle. He had right arm trauma catching his hand in a piece of farm equipment and lost all of his fingers except his thumb and part of his second finger. Patient History Information obtained from Patient. Allergies codeine Family History Cancer - Father, Diabetes - Siblings, No family history of Heart Disease, Hereditary Spherocytosis, Hypertension, Kidney Disease, Lung Disease, Seizures, Stroke, Thyroid Problems, Tuberculosis. Social History Former smoker, Marital Status - Divorced, Alcohol Use - Moderate, Drug Use - No History, Caffeine Use - Daily. Medical History Eyes Denies history of Cataracts, Glaucoma, Optic Neuritis Ear/Nose/Mouth/Throat Denies history of Chronic sinus problems/congestion, Middle ear problems Hematologic/Lymphatic Denies history of Anemia, Hemophilia, Human Immunodeficiency Virus, Lymphedema, Sickle Cell Disease Respiratory Denies history of Aspiration, Asthma, Chronic Obstructive Pulmonary Disease (COPD), Pneumothorax, Sleep Apnea, Tuberculosis Cardiovascular Patient has  history of Hypertension Denies history of Angina, Arrhythmia, Congestive Heart Failure, Coronary Artery Disease, Deep Vein Thrombosis, Hypotension, Myocardial Infarction, Peripheral Arterial Disease, Peripheral Venous Disease, Phlebitis, Vasculitis Gastrointestinal Denies history of Cirrhosis , Colitis, Crohnoos, Hepatitis A, Hepatitis B, Hepatitis C Endocrine Patient has history of Type II Diabetes Denies history of Type I Diabetes Genitourinary Denies history of End Stage Renal Disease Immunological Denies history of Lupus Erythematosus, Raynaudoos, Scleroderma Integumentary (Skin) Denies history of History of Burn Musculoskeletal Denies history of Gout, Rheumatoid Arthritis, Osteoarthritis, Osteomyelitis Neurologic Denies history of Dementia, Neuropathy, Quadriplegia, Paraplegia, Seizure Disorder Oncologic Denies history of Received Chemotherapy, Received Radiation Psychiatric Denies history of Anorexia/bulimia, Confinement Anxiety Patient is  treated with Oral Agents. Blood sugar is not tested. Review of Systems (ROS) Constitutional Symptoms (General Health) Denies complaints or symptoms of Fatigue, Fever, Chills, Marked Weight Change. Eyes Denies complaints or symptoms of Dry Eyes, Vision Changes, Glasses / Contacts. Ear/Nose/Mouth/Throat Denies complaints or symptoms of Chronic sinus problems or rhinitis. Respiratory Denies complaints or symptoms of Chronic or frequent coughs, Shortness of Breath. Cardiovascular Denies complaints or symptoms of Chest pain. Gastrointestinal Denies complaints or symptoms of Frequent diarrhea, Nausea, Vomiting. Endocrine Denies complaints or symptoms of Heat/cold intolerance. Genitourinary Denies complaints or symptoms of Frequent urination. Integumentary (Skin) Complains or has symptoms of Wounds. Musculoskeletal Denies complaints or symptoms of Muscle Pain, Muscle Weakness. Neurologic Denies complaints or symptoms of  Numbness/parasthesias. Psychiatric Denies complaints or symptoms of Claustrophobia, Suicidal. Objective Constitutional Patient is hypertensive.. Pulse regular and within target range for patient.Marland Kitchen Respirations regular, non-labored and within target range.. Temperature is normal and within the target range for the patient.Marland Kitchen Appears in no distress. Vitals Time Taken: 9:03 AM, Height: 66 in, Source: Stated, Weight: 232 lbs, Source: Stated, BMI: 37.4, Temperature: 98.4 F, Pulse: 78 bpm, Respiratory Rate: 18 breaths/min, Blood Pressure: 179/97 mmHg. Eyes Conjunctivae clear. No discharge.no icterus. Respiratory work of breathing is normal. Bilateral breath sounds are clear and equal in all lobes with no wheezes, rales or rhonchi.. Cardiovascular 3 out of 6 short midsystolic murmur at the aortic area that does radiate. JVP not elevated. Popliteal pulses culpable on the right. Faint dorsalis pedis pulse in the right foot palpable on the left.. Pitting edema in the bilateral lower legs. Psychiatric appears at normal baseline. General Notes: Wound exam; the patient's wound are on the right lateral lower leg. Nonviable eschar and skin removed from the circumference some debris over the surface hemostasis with direct pressure. He has erythema around the wounds and into the anterior part of the right lower leg this is nontender not suggestive of cellulitis. He has edema but no calf tenderness. Also notably has very similar findings on the left leg Integumentary (Hair, Skin) Patient has chronic stasis dermatitis in the right anterior and lateral lower leg also the left leg notable. Wound #1 status is Open. Original cause of wound was Gradually Appeared. The wound is located on the Right,Lateral Lower Leg. The wound measures 4.5cm length x 4.5cm width x 0.1cm depth; 15.904cm^2 area and 1.59cm^3 volume. There is Fat Layer (Subcutaneous Tissue) Exposed exposed. There is no tunneling or undermining noted.  There is a medium amount of serosanguineous drainage noted. The wound margin is flat and intact. There is medium (34-66%) red granulation within the wound bed. There is a medium (34-66%) amount of necrotic tissue within the wound bed including Adherent Slough. 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 There was a Excisional Skin/Subcutaneous Tissue Debridement with a total area of 20.25 sq cm performed by Ricard Dillon., MD. With the following instrument(s): Curette to remove Viable and Non-Viable tissue/material. Material removed includes Eschar, Subcutaneous Tissue, and Slough after achieving pain control using Other (Benzocaine 20%). No specimens were taken. A time out was conducted at 09:40, prior to the  start of the procedure. A Minimum amount of bleeding was controlled with Pressure. The procedure was tolerated well with a pain level of 4 throughout and a pain level of 2 following the procedure. Post Debridement Measurements: 4.5cm length x 4.5cm width x 0.1cm depth; 1.59cm^3 volume. Character of Wound/Ulcer Post Debridement is improved. Severity of Tissue Post Debridement is: Fat layer exposed. Post procedure Diagnosis Wound #1: Same as Pre-Procedure Pre-procedure diagnosis of Wound #1 is a Venous Leg Ulcer located on the Right,Lateral Lower Leg . There was a Three Layer Compression Therapy Procedure by Zandra Abts, RN. Post procedure Diagnosis Wound #1: Same as Pre-Procedure Plan Follow-up Appointments: Return  Appointment in 1 week. Dressing Change Frequency: Wound #1 Right,Lateral Lower Leg: Do not change entire dressing for one week. Skin Barriers/Peri-Wound Care: Wound #1 Right,Lateral Lower Leg: Moisturizing lotion TCA Cream or Ointment - mixed with lotion Wound Cleansing: May shower with protection. - may use cast protector Primary Wound Dressing: Wound #1 Right,Lateral Lower Leg: Calcium Alginate with Silver Secondary Dressing: Wound #1 Right,Lateral Lower Leg: ABD pad Zetuvit or Kerramax Edema Control: 3 Layer Compression System - Right Lower Extremity Avoid standing for long periods of time Elevate legs to the level of the heart or above for 30 minutes daily and/or when sitting, a frequency of: - throughout the day Exercise regularly Services and Therapies ordered were: Venous Studies -Bilateral reflux study - wound on right lower leg, bilateral leg swelling, Arterial Studies- Bilateral with ABIs and TBIs - Non healing ulcer on right lower leg, abnormal arterial study in 2020 1. I think this patient has bilateral chronic venous insufficiency with a venous insufficiency wound on the right lateral lower leg. We cleaned this off with some difficulty with a #5 curette. We are going to put silver alginate/Zetuvit/Kerramax/ABDs under 3 layer compression. 2. I emphasized to him that the leg should not hurt like a toothache if he was unable to get comfortable he is to take the wrap off and call me. He has PAD documented from studies he had done in July 2020. I think he should be able to tolerate 3 layer compression but I would not go to 4 at this point 3. We will order chronic venous reflux studies in both legs as well as repeating his arterial study on the right. 4. He is apparently being fitted for a Cam boot or a modified cam boot for the right ankle. I am concerned that this could cause friction on the area where the wound is however they will not do the final fitting until the wounds  are healed on the right lateral lower leg. 5. I do not think there is any evidence of a DVT or cellulitis here currently we will wait for the venous reflux studies to see if he needs a referral to vascular surgery I spent 40 minutes in review of this patient's record, face-to-face evaluation and preparation of this record Electronic Signature(s) Signed: 04/03/2020 6:28:13 PM By: Zandra Abts RN, BSN Signed: 04/03/2020 8:16:21 PM By: Baltazar Najjar MD Entered By: Zandra Abts on 04/03/2020 10:33:47 -------------------------------------------------------------------------------- HxROS Details Patient Name: Date of Service: [name], [name] 04/03/2020 9:00 A M Medical Record Number: [id number] Patient Account Number: [id number] Date of Birth/Sex: Treating RN: [birth year] (78 y.o. [name] Primary Care Provider: Koren Shiver Other Clinician: Referring Provider: Treating Provider/Extender: Melynda Ripple in Treatment: 0 Information Obtained From Patient Constitutional Symptoms (General Health) Complaints and Symptoms: Negative for: Fatigue; Fever; Chills; Marked  Weight Change Eyes Complaints and Symptoms: Negative for: Dry Eyes; Vision Changes; Glasses / Contacts Medical History: Negative for: Cataracts; Glaucoma; Optic Neuritis Ear/Nose/Mouth/Throat Complaints and Symptoms: Negative for: Chronic sinus problems or rhinitis Medical History: Negative for: Chronic sinus problems/congestion; Middle ear problems Respiratory Complaints and Symptoms: Negative for: Chronic or frequent coughs; Shortness of Breath Medical History: Negative for: Aspiration; Asthma; Chronic Obstructive Pulmonary Disease (COPD); Pneumothorax; Sleep Apnea; Tuberculosis Cardiovascular Complaints and Symptoms: Negative for: Chest pain Medical History: Positive for: Hypertension Negative for: Angina; Arrhythmia; Congestive Heart Failure; Coronary Artery Disease; Deep Vein  Thrombosis; Hypotension; Myocardial Infarction; Peripheral Arterial Disease; Peripheral Venous Disease; Phlebitis; Vasculitis Gastrointestinal Complaints and Symptoms: Negative for: Frequent diarrhea; Nausea; Vomiting Medical History: Negative for: Cirrhosis ; Colitis; Crohns; Hepatitis A; Hepatitis B; Hepatitis C Endocrine Complaints and Symptoms: Negative for: Heat/cold intolerance Medical History: Positive for: Type II Diabetes Negative for: Type I Diabetes Time with diabetes: 20 years Treated with: Oral agents Blood sugar tested every day: No Genitourinary Complaints and Symptoms: Negative for: Frequent urination Medical History: Negative for: End Stage Renal Disease Integumentary (Skin) Complaints and Symptoms: Positive for: Wounds Medical History: Negative for: History of Burn Musculoskeletal Complaints and Symptoms: Negative for: Muscle Pain; Muscle Weakness Medical History: Negative for: Gout; Rheumatoid Arthritis; Osteoarthritis; Osteomyelitis Neurologic Complaints and Symptoms: Negative for: Numbness/parasthesias Medical History: Negative for: Dementia; Neuropathy; Quadriplegia; Paraplegia; Seizure Disorder Psychiatric Complaints and Symptoms: Negative for: Claustrophobia; Suicidal Medical History: Negative for: Anorexia/bulimia; Confinement Anxiety Hematologic/Lymphatic Medical History: Negative for: Anemia; Hemophilia; Human Immunodeficiency Virus; Lymphedema; Sickle Cell Disease Immunological Medical History: Negative for: Lupus Erythematosus; Raynauds; Scleroderma Oncologic Medical History: Negative for: Received Chemotherapy; Received Radiation Immunizations Pneumococcal Vaccine: Received Pneumococcal Vaccination: No Implantable Devices None Family and Social History Cancer: Yes - Father; Diabetes: Yes - Siblings; Heart Disease: No; Hereditary Spherocytosis: No; Hypertension: No; Kidney Disease: No; Lung Disease: No; Seizures: No; Stroke: No;  Thyroid Problems: No; Tuberculosis: No; Former smoker; Marital Status - Divorced; Alcohol Use: Moderate; Drug Use: No History; Caffeine Use: Daily Electronic Signature(s) Signed: 04/03/2020 8:16:21 PM By: Baltazar Najjarobson, Claud Gowan MD Signed: 04/04/2020 4:34:33 PM By: Yevonne PaxEpps, Carrie RN Entered By: Yevonne PaxEpps, Carrie on 04/03/2020 09:09:32 -------------------------------------------------------------------------------- SuperBill Details Patient Name: Date of Service: [name], [name] 04/03/2020 Medical Record Number: [id number] Patient Account Number: [id number] Date of Birth/Sex: Treating RN: [birth year] (79 y.o. 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 Facility Procedures CPT4 Code: [id number] Description: 99213 - WOUND CARE VISIT-LEV 3 EST PT Modifier: 25 Quantity: 1 CPT4 Code: [id number] Description: 11042 - DEB SUBQ TISSUE 20 SQ CM/< ICD-10 Diagnosis Description 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 Modifier: sed Quantity: 1 CPT4 Code: [id number] Description: 11045 - DEB SUBQ TISS EA ADDL 20CM ICD-10 Diagnosis Description 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se Modifier: d Quantity: 1 Physician Procedures : CPT4 Code Description Modifier [id number] WC PHYS LEVEL 3 NEW PT 25 ICD-10 Diagnosis Description I87.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 venous hypertension (idiopathic) with ulcer and inflammation of right lower extremity  L97.812 Non-pressure chronic ulcer of  other part of  right lower leg with fat layer exposed E11.622 Type 2 diabetes mellitus with other skin ulcer 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

## 2020-04-10 ENCOUNTER — Encounter (HOSPITAL_BASED_OUTPATIENT_CLINIC_OR_DEPARTMENT_OTHER): Payer: Medicare Other | Admitting: Physician Assistant

## 2020-04-10 ENCOUNTER — Other Ambulatory Visit: Payer: Self-pay

## 2020-04-10 DIAGNOSIS — I87331 Chronic venous hypertension (idiopathic) with ulcer and inflammation of right lower extremity: Secondary | ICD-10-CM | POA: Diagnosis not present

## 2020-04-10 DIAGNOSIS — L97812 Non-pressure chronic ulcer of other part of right lower leg with fat layer exposed: Secondary | ICD-10-CM | POA: Diagnosis not present

## 2020-04-10 DIAGNOSIS — E11622 Type 2 diabetes mellitus with other skin ulcer: Secondary | ICD-10-CM | POA: Diagnosis not present

## 2020-04-10 NOTE — Progress Notes (Addendum)
Kevin Weaver ([id number]) Visit Report for 04/10/2020 Chief Complaint Document Details Patient Name: Date of Service: [name], [name] 04/10/2020 2:30 PM Medical Record Number: [id number] Patient Account Number: [id number] Date of Birth/Sex: Treating RN: Oct 06, 1941 (79 y.o. Kevin Weaver Primary Care Provider: Koren Shiver Other Clinician: Referring Provider: Treating Provider/Extender: Langley Adie, Irma Weeks in Treatment: 1 Information Obtained from: Patient Chief Complaint 04/03/20; patient is here for review of wounds on his right lateral lower leg Electronic Signature(s) Signed: 04/10/2020 5:45:05 PM By: Lenda Kelp PA-C Previous Signature: 04/10/2020 2:56:31 PM Version By: Lenda Kelp PA-C Entered By: Lenda Kelp on 04/10/2020 17:45:05 -------------------------------------------------------------------------------- HPI Details Patient Name: Date of Service: [name], [name] 04/10/2020 2:30 PM Medical Record Number: [id number] Patient Account Number: [id number] Date of Birth/Sex: Treating RN: 09/27/1941 (79 y.o. Kevin Weaver Primary Care Provider: Koren Shiver Other Clinician: Referring Provider: Treating Provider/Extender: Langley Adie, Irma Weeks in Treatment: 1 History of Present Illness HPI Description: ADMISSION 04/03/20 This is a 79 year old man who states he has been dealing with blistering on his lower legs since Thanksgiving of last year and remaining wounds on the right lateral lower leg for the last several months. He says he has had a lot of pain to uncomfortable. He was seen by his primary doctor on 5/25 diagnosed with cellulitis and an ulcer on his right ankle who was given Keflex and some ointment but the patient does not remember what that was. He was felt to have a stasis ulcer. The patient had arterial studies done in July 2020. These showed an ABI of 1.07 on the right but a TBI of only 0.38 with  monophasic waveforms. On the left he had an ABI of 1.11 with a TBI of 0.67 and triphasic waveforms. Past medical history includes type 2 diabetes, hypertension, hyperlipidemia, leg swelling, PAD, right foot trauma at age 41 where his foot was caught in a lawn more. Left him with a permanent inversion at the right ankle. He had right arm trauma catching his hand in a piece of farm equipment and lost all of his fingers except his thumb and part of his second finger. 04/10/2020 upon evaluation today patient appears to be doing well upon evaluation today in regard to his wounds on his leg. We have actually divided those in the 2 because they were doing as well as they are. Fortunately there is no signs of active infection at this time which is great news. I do believe the compression wrap is also been of benefit. Electronic Signature(s) Signed: 04/10/2020 5:45:12 PM By: Lenda Kelp PA-C Previous Signature: 04/10/2020 5:38:41 PM Version By: Lenda Kelp PA-C Entered By: Lenda Kelp on 04/10/2020 17:45:12 -------------------------------------------------------------------------------- Physical Exam Details Patient Name: Date of Service: [name], [name] 04/10/2020 2:30 PM Medical Record Number: [id number] Patient Account Number: [id number] Date of Birth/Sex: Treating RN: 01/14/1941 (79 y.o. Kevin Weaver Primary Care Provider: Koren Shiver Other Clinician: Referring Provider: Treating Provider/Extender: Langley Adie, Irma Weeks in Treatment: 1 Constitutional Well-nourished and well-hydrated in no acute distress. Respiratory normal breathing without difficulty. Psychiatric this patient is able to make decisions and demonstrates good insight into disease process. Alert and Oriented x 3. pleasant and cooperative. Notes Upon inspection patient's wounds actually showed signs of good granulation epithelization and very pleased with where things stand today. In fact the  wounds were divided into 2 because they were doing  so well. Electronic Signature(s) Signed: 04/10/2020 5:45:29 PM By: Lenda Kelp PA-C Previous Signature: 04/10/2020 5:40:14 PM Version By: Lenda Kelp PA-C Entered By: Lenda Kelp on 04/10/2020 17:45:29 -------------------------------------------------------------------------------- Physician Orders Details Patient Name: Date of Service: [name], [name] 04/10/2020 2:30 PM Medical Record Number: [id number] Patient Account Number: [id number] Date of Birth/Sex: Treating RN: 1941-10-13 (79 y.o. 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 Dressing Change Frequency Wound #1 Right,Lateral Lower Leg Do not change entire dressing for one week. Skin Barriers/Peri-Wound Care Wound #1 Right,Lateral Lower Leg Moisturizing lotion TCA Cream or Ointment - mixed with lotion Wound Cleansing May shower with protection. - may use cast protector Primary Wound Dressing Wound #1 Right,Lateral Lower Leg Calcium Alginate with Silver Secondary Dressing Wound #1 Right,Lateral Lower Leg Dry Gauze ABD pad Edema Control 3 Layer Compression System - Right Lower Extremity Avoid standing for long periods of time Elevate legs to the level of the heart or above for 30 minutes daily and/or when sitting, a frequency of: - throughout the day Exercise  regularly Electronic Signature(s) Signed: 04/10/2020 5:53:30 PM By: Lenda Kelp PA-C Previous Signature: 04/10/2020 5:38:02 PM Version By: Zenaida Deed RN, BSN Entered By: Lenda Kelp on 04/10/2020 17:45:38 -------------------------------------------------------------------------------- Problem List Details Patient Name: Date of Service: [name], [name] 04/10/2020 2:30 PM Medical Record Number: [id number] Patient Account Number: [id number] Date of Birth/Sex: Treating RN: February 19, 1941 (79 y.o. 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 04/10/2020 2:30 PM Medical Record Number: [id number] Patient Account Number: [id number] Date of Birth/Sex: Treating RN: January 05, 1941 (79 y.o. Kevin Weaver Primary Care Provider: Koren Shiver Other Clinician: Referring Provider: Treating Provider/Extender: Langley Adie, Darcel Bayley Weeks in Treatment: 1 Subjective Chief  Complaint Information obtained from Patient 04/03/20; patient is here for review of wounds on his right lateral lower leg History of Present Illness (HPI) ADMISSION 04/03/20 This is a 79 year old man who states he has been dealing with blistering on his lower legs since Thanksgiving of last year and remaining wounds on the right lateral lower leg for the last several months. He says he has had a lot of pain to uncomfortable. He was seen by his primary doctor on 5/25  diagnosed with cellulitis and an ulcer on his right ankle who was given Keflex and some ointment but the patient does not remember what that was. He was felt to have a stasis ulcer. The patient had arterial studies done in July 2020. These showed an ABI of 1.07 on the right but a TBI of only 0.38 with monophasic waveforms. On the left he had an ABI of 1.11 with a TBI of 0.67 and triphasic waveforms. Past medical history includes type 2 diabetes, hypertension, hyperlipidemia, leg swelling, PAD, right foot trauma at age 21 where his foot was caught in a lawn more. Left him with a permanent inversion at the right ankle. He had right arm trauma catching his hand in a piece of farm equipment and lost all of his fingers except his thumb and part of his second finger. 04/10/2020 upon evaluation today patient appears to be doing well upon evaluation today in regard to his wounds on his leg. We have actually divided those in the 2 because they were doing as well as they are. Fortunately there is no signs of active infection at this time which is great news. I do believe the compression wrap is also been of benefit. Objective Constitutional Well-nourished and well-hydrated in no acute distress. Vitals Time Taken: 3:58 PM, Height: 66 in, Weight: 232 lbs, BMI: 37.4, Temperature: 98.5 F, Pulse: 76 bpm, Respiratory Rate: 18 breaths/min, Blood Pressure: 143/96 mmHg. Respiratory normal breathing without difficulty. Psychiatric this patient is  able to make decisions and demonstrates good insight into disease process. Alert and Oriented x 3. pleasant and cooperative. General Notes: Upon inspection patient's wounds actually showed signs of good granulation epithelization and very pleased with where things stand today. In fact the wounds were divided into 2 because they were doing so well. Integumentary (Hair, Skin) Wound #1 status is Open. Original cause of wound was Gradually Appeared. The wound is located on the Right,Lateral Lower Leg. The wound measures 1.7cm length x 2cm width x 0.1cm depth; 2.67cm^2 area and 0.267cm^3 volume. There is Fat Layer (Subcutaneous Tissue) Exposed exposed. There is no tunneling or undermining noted. There is a medium amount of serosanguineous drainage noted. The wound margin is flat and intact. There is medium (34-66%) red granulation within the wound bed. There is a medium (34-66%) amount of necrotic tissue within the wound bed including Adherent Slough. Wound #2 status is Open. Original cause of wound was Gradually Appeared. The wound is located on the Right,Lateral,Posterior Lower Leg. The wound measures 2cm length x 1cm width x 0.1cm depth; 1.571cm^2 area and 0.157cm^3 volume. There is no tunneling or undermining noted. There is a medium amount of serous drainage noted. There is medium (34-66%) red granulation within the wound bed. There is a medium (34-66%) amount of necrotic tissue within the wound bed including Adherent Slough. 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 There was a Three Layer  Compression Therapy Procedure by [name], RN. Post procedure Diagnosis Wound #1: Same as Pre-Procedure Wound #2 Pre-procedure diagnosis of Wound #2 is a Diabetic Wound/Ulcer of the Lower Extremity located on the Right,Lateral,Posterior Lower Leg . There was a Three Layer  Compression Therapy Procedure by Cherylin Mylar, RN. Post procedure Diagnosis Wound #2: Same as Pre-Procedure Plan Follow-up Appointments: Return Appointment in 1 week. Dressing Change Frequency: Wound #1 Right,Lateral Lower Leg: Do not change entire dressing for one week. Skin Barriers/Peri-Wound Care: Wound #1 Right,Lateral Lower Leg: Moisturizing lotion TCA Cream or Ointment - mixed with lotion Wound Cleansing: May shower with protection. - may use cast protector Primary Wound Dressing: Wound #1 Right,Lateral Lower Leg: Calcium Alginate with Silver Secondary Dressing: Wound #1 Right,Lateral Lower Leg: Dry Gauze ABD pad Edema Control: 3 Layer Compression System - Right Lower Extremity Avoid standing for long periods of time Elevate legs to the level of the heart or above for 30 minutes daily and/or when sitting, a frequency of: - throughout the day Exercise regularly 1. Upon inspection patient's wound actually showed signs of good granulation at this time. Fortunately there is no evidence of problems here in regard to the wound healing and I am very pleased with where things stand. I am getting go ahead and suggest that we continue with the TCA cream as well as using the silver alginate dressing which seems to been beneficial. 2. We will also continue with 3 layer compression wrap. 3. The patient unfortunately has not had a call from vascular yet that appointment has been put in for referral but again he has not heard back from them currently. Hopefully he will in the next several days. We will see patient back for reevaluation in 1 week here in the clinic. If anything worsens or changes patient  will contact our office for additional recommendations. Electronic Signature(s) Signed: 04/11/2020 5:45:30 PM By: Zenaida Deed RN, BSN Signed: 04/13/2020 1:39:37 PM By: Lenda Kelp PA-C Previous Signature: 04/10/2020 5:45:49 PM Version By: Lenda Kelp PA-C Previous Signature: 04/10/2020 5:41:28 PM Version By: Lenda Kelp PA-C Entered By: Zenaida Deed on 04/11/2020 07:25:20 -------------------------------------------------------------------------------- SuperBill Details Patient Name: Date of Service: [name], [name] 04/10/2020 Medical Record Number: [id number] Patient Account Number: [id number] Date of Birth/Sex: Treating RN: [birth year] (79 y.o. 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 Facility Procedures CPT4 Code: [id number] Description: (Facility Use Only) [phone] - APPLY MULTLAY COMPRS LWR RT LEG Modifier: Quantity: 1 Physician Procedures : CPT4 Code Description Modifier [id number] 99213 - WC PHYS LEVEL 3 - EST PT ICD-10 Diagnosis Description I87.331 Chronic venous hypertension (idiopathic) with ulcer and inflammation of right lower extremity L97.812 Non-pressure chronic ulcer of other part  of right lower leg with fat layer exposed E11.622 Type 2 diabetes mellitus with other skin ulcer 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

## 2020-04-12 NOTE — Progress Notes (Signed)
Kevin Weaver, Kevin Weaver ([id number]) Visit Report for 04/10/2020 Arrival Information Details Patient Name: Date of Service: [name], [name] 04/10/2020 2:30 PM Medical Record Number: [id number] Patient Account Number: [id number] Date of Birth/Sex: Treating RN: [birth year] (79 y.o. Kevin Weaver Primary Care Rhealynn Myhre: Koren Shiver Other Clinician: Referring Latrica Clowers: Treating Alaric Gladwin/Extender: Langley Adie, Irma Weeks in Treatment: 1 Visit Information History Since Last Visit Added or deleted any medications: No Patient Arrived: Ambulatory Any new allergies or adverse reactions: No Arrival Time: 15:58 Had a fall or experienced change in No Accompanied By: self activities of daily living that may affect Transfer Assistance: None risk of falls: Patient Identification Verified: Yes Signs or symptoms of abuse/neglect since last visito No Secondary Verification Process Completed: Yes Hospitalized since last visit: No Patient Requires Transmission-Based Precautions: No Implantable device outside of the clinic excluding No Patient Has Alerts: Yes cellular tissue based products placed in the center Patient Alerts: R ABI: 1.07 TBI: 0.38 since last visit: L ABI:1.11 TBI: 0.65 Has Dressing in Place as Prescribed: Yes July 2020 Pain Present Now: No Electronic Signature(s) Signed: 04/11/2020 5:45:30 PM By: Zenaida Deed RN, BSN Entered By: Zenaida Deed on 04/11/2020 07:20:35 -------------------------------------------------------------------------------- Compression Therapy Details Patient Name: Date of Service: [name], [name] 04/10/2020 2:30 PM Medical Record Number: [id number] Patient Account Number: [id number] Date of Birth/Sex: Treating RN: 11-19-1940 (79 y.o. Kevin Weaver Primary Care Jatin Naumann: Koren Shiver Other Clinician: Referring Ronneisha Jett: Treating Warren Lindahl/Extender: Melynda Ripple in Treatment: 1 Compression Therapy  Performed for Wound Assessment: Wound #1 Right,Lateral Lower Leg Performed By: Clinician Cherylin Mylar, RN Compression Type: Three Layer Post Procedure Diagnosis Same as Pre-procedure Electronic Signature(s) Signed: 04/10/2020 5:38:02 PM By: Zenaida Deed RN, BSN Entered By: Zenaida Deed on 04/10/2020 16:33:04 -------------------------------------------------------------------------------- Compression Therapy Details Patient Name: Date of Service: [name], [name] 04/10/2020 2:30 PM Medical Record Number: [id number] Patient Account Number: [id number] Date of Birth/Sex: Treating RN: 06-04-1941 (79 y.o. Kevin Weaver Primary Care Yama Nielson: Koren Shiver Other Clinician: Referring Nathon Stefanski: Treating Deniqua Perry/Extender: Melynda Ripple in Treatment: 1 Compression Therapy Performed for Wound Assessment: Wound #2 Right,Lateral,Posterior Lower Leg Performed By: Clinician Cherylin Mylar, RN Compression Type: Three Layer Post Procedure Diagnosis Same as Pre-procedure Electronic Signature(s) Signed: 04/10/2020 5:38:02 PM By: Zenaida Deed RN, BSN Entered By: Zenaida Deed on 04/10/2020 16:33:05 -------------------------------------------------------------------------------- Encounter Discharge Information Details Patient Name: Date of Service: [name], [name] 04/10/2020 2:30 PM Medical Record Number: [id number] Patient Account Number: [id number] Date of Birth/Sex: Treating RN: June 10, 1941 (78 y.o. Kevin Weaver Primary Care Ameisha Mcclellan: Koren Shiver Other Clinician: Referring Jeramiah Mccaughey: Treating Levaughn Puccinelli/Extender: Charmayne Sheer Weeks in Treatment: 1 Encounter Discharge Information Items Discharge Condition: Stable Ambulatory Status: Ambulatory Discharge Destination: Home Transportation: Private Auto Accompanied By: self Schedule Follow-up Appointment: Yes Clinical Summary of Care: Patient Declined Electronic  Signature(s) Signed: 04/11/2020 5:45:30 PM By: Zenaida Deed RN, BSN Entered By: Zenaida Deed on 04/11/2020 07:24:21 -------------------------------------------------------------------------------- Lower Extremity Assessment Details Patient Name: Date of Service: [name], [name] 04/10/2020 2:30 PM Medical Record Number: [id number] Patient Account Number: [id number] Date of Birth/Sex: Treating RN: 29-Jun-1941 (78 y.o. Kevin Weaver Primary Care Evanee Lubrano: Koren Shiver Other Clinician: Referring Lisandra Mathisen: Treating Jaquane Boughner/Extender: Langley Adie, Irma Weeks in Treatment: 1 Edema Assessment Assessed: [Left: No] [Right: No] E[Left: dema] [Right: :] Calf Left: Right: Point of Measurement: 42 cm From Medial Instep cm 35 cm Ankle Left:  Right: Point of Measurement: 11 cm From Medial Instep cm 26 cm Electronic Signature(s) Signed: 04/11/2020 5:45:30 PM By: Zenaida Deed RN, BSN Signed: 04/12/2020 9:54:21 AM By: Yevonne Pax RN Entered By: Zenaida Deed on 04/11/2020 07:22:32 -------------------------------------------------------------------------------- Multi Wound Chart Details Patient Name: Date of Service: [name], [name] 04/10/2020 2:30 PM Medical Record Number: [id number] Patient Account Number: [id number] Date of Birth/Sex: Treating RN: 08/12/1941 (79 y.o. Kevin Weaver Primary Care Mehdi Gironda: Koren Shiver Other Clinician: Referring Asser Lucena: Treating Jailin Manocchio/Extender: Langley Adie, Irma Weeks in Treatment: 1 Vital Signs Height(in): 66 Pulse(bpm): 76 Weight(lbs): 232 Blood Pressure(mmHg): 143/96 Body Mass Index(BMI): 37 Temperature(F): 98.5 Respiratory Rate(breaths/min): 18 Photos: [1:No Photos Right, Lateral Lower Leg] [2:No Photos Right, Lateral, Posterior Lower Leg] [N/A:N/A N/A] Wound Location: [1:Gradually Appeared] [2:Gradually Appeared] [N/A:N/A] Wounding Event: [1:Venous Leg Ulcer] [2:Diabetic Wound/Ulcer of  the Lower] [N/A:N/A] Primary Etiology: [1:Hypertension, Type II Diabetes] [2:Extremity Hypertension, Type II Diabetes] [N/A:N/A] Comorbid History: [1:12/27/2019] [2:04/10/2020] [N/A:N/A] Date Acquired: [1:1] [2:0] [N/A:N/A] Weeks of Treatment: [1:Open] [2:Open] [N/A:N/A] Wound Status: [1:Yes] [2:No] [N/A:N/A] Clustered Wound: [1:3] [2:N/A] [N/A:N/A] Clustered Quantity: [1:1.7x2x0.1] [2:2x1x0.1] [N/A:N/A] Measurements L x W x D (cm) [1:2.67] [2:1.571] [N/A:N/A] A (cm) : rea [1:0.267] [2:0.157] [N/A:N/A] Volume (cm) : [1:83.20%] [2:0.00%] [N/A:N/A] % Reduction in Area: [1:83.20%] [2:0.00%] [N/A:N/A] % Reduction in Volume: [1:Full Thickness Without Exposed] [2:Grade 2] [N/A:N/A] Classification: [1:Support Structures Medium] [2:Medium] [N/A:N/A] Exudate Amount: [1:Serosanguineous] [2:Serous] [N/A:N/A] Exudate Type: [1:red, brown] [2:amber] [N/A:N/A] Exudate Color: [1:Flat and Intact] [2:N/A] [N/A:N/A] Wound Margin: [1:Medium (34-66%)] [2:Medium (34-66%)] [N/A:N/A] Granulation Amount: [1:Red] [2:Red] [N/A:N/A] Granulation Quality: [1:Medium (34-66%)] [2:Medium (34-66%)] [N/A:N/A] Necrotic Amount: [1:Fat Layer (Subcutaneous Tissue)] [2:Fascia: No] [N/A:N/A] Exposed Structures: [1:Exposed: Yes Fascia: No Tendon: No Muscle: No Joint: No Bone: No None] [2:Fat Layer (Subcutaneous Tissue) Exposed: No Tendon: No Muscle: No Joint: No Bone: No None] [N/A:N/A] Epithelialization: [1:Compression Therapy] [2:Compression Therapy] [N/A:N/A] Treatment Notes Wound #1 (Right, Lateral Lower Leg) 1. Cleanse With Wound Cleanser Soap and water 3. Primary Dressing Applied Calcium Alginate Ag 4. Secondary Dressing ABD Pad Dry Gauze 6. Support Layer Applied 3 layer compression wrap Notes netting Wound #2 (Right, Lateral, Posterior Lower Leg) 1. Cleanse With Wound Cleanser Soap and water 3. Primary Dressing Applied Calcium Alginate Ag 4. Secondary Dressing ABD Pad Dry Gauze 6. Support Layer  Applied 3 layer compression wrap Notes netting Electronic Signature(s) Signed: 04/10/2020 5:44:51 PM By: Lenda Kelp PA-C Signed: 04/11/2020 5:45:30 PM By: Zenaida Deed RN, BSN Entered By: Lenda Kelp on 04/10/2020 17:44:51 -------------------------------------------------------------------------------- Multi-Disciplinary Care Plan Details Patient Name: Date of Service: [name], [name] 04/10/2020 2:30 PM Medical Record Number: [id number] Patient Account Number: [id number] Date of Birth/Sex: Treating RN: Apr 11, 1941 (79 y.o. Kevin Weaver Primary Care Bethann Qualley: Koren Shiver Other Clinician: Referring Torrence Hammack: Treating Anaysia Germer/Extender: Langley Adie, Irma Weeks in Treatment: 1 Active Inactive Nutrition Nursing Diagnoses: Impaired glucose control: actual or potential Potential for alteratiion in Nutrition/Potential for imbalanced nutrition Goals: Patient/caregiver agrees to and verbalizes understanding of need to use nutritional supplements and/or vitamins as prescribed Date Initiated: 04/03/2020 Target Resolution Date: 04/28/2020 Goal Status: Active Patient/caregiver will maintain therapeutic glucose control Date Initiated: 04/03/2020 Target Resolution Date: 04/28/2020 Goal Status: Active Interventions: Assess HgA1c results as ordered upon admission and as needed Assess patient nutrition upon admission and as needed per policy Provide education on elevated blood sugars and impact on wound healing Provide education on nutrition Treatment Activities: Education provided on Nutrition : 04/03/2020 Notes: Venous Leg Ulcer  Nursing Diagnoses: Knowledge deficit related to disease process and management Potential for venous Insuffiency (use before diagnosis confirmed) Goals: Patient will maintain optimal edema control Date Initiated: 04/03/2020 Target Resolution Date: 04/28/2020 Goal Status: Active Patient/caregiver will verbalize understanding of disease  process and disease management Date Initiated: 04/03/2020 Target Resolution Date: 04/28/2020 Goal Status: Active Interventions: Assess peripheral edema status every visit. Compression as ordered Provide education on venous insufficiency Notes: Wound/Skin Impairment Nursing Diagnoses: Impaired tissue integrity Knowledge deficit related to ulceration/compromised skin integrity Goals: Patient/caregiver will verbalize understanding of skin care regimen Date Initiated: 04/03/2020 Target Resolution Date: 04/28/2020 Goal Status: Active Ulcer/skin breakdown will have a volume reduction of 30% by week 4 Date Initiated: 04/03/2020 Target Resolution Date: 04/28/2020 Goal Status: Active Interventions: Assess patient/caregiver ability to obtain necessary supplies Assess patient/caregiver ability to perform ulcer/skin care regimen upon admission and as needed Assess ulceration(s) every visit Provide education on ulcer and skin care Notes: Electronic Signature(s) Signed: 04/11/2020 5:45:30 PM By: Zenaida Deed RN, BSN Previous Signature: 04/10/2020 5:38:02 PM Version By: Zenaida Deed RN, BSN Entered By: Zenaida Deed on 04/11/2020 07:23:39 -------------------------------------------------------------------------------- Pain Assessment Details Patient Name: Date of Service: [name], [name] 04/10/2020 2:30 PM Medical Record Number: [id number] Patient Account Number: [id number] Date of Birth/Sex: Treating RN: 06-Jul-1941 (79 y.o. Kevin Weaver Primary Care Carolan Avedisian: Koren Shiver Other Clinician: Referring Torie Priebe: Treating Clinten Howk/Extender: Langley Adie, Irma Weeks in Treatment: 1 Active Problems Location of Pain Severity and Description of Pain Patient Has Paino No Site Locations Pain Management and Medication Current Pain Management: Electronic Signature(s) Signed: 04/11/2020 5:45:30 PM By: Zenaida Deed RN, BSN Previous Signature: 04/10/2020 5:38:02 PM Version  By: Zenaida Deed RN, BSN Entered By: Zenaida Deed on 04/11/2020 07:22:25 -------------------------------------------------------------------------------- Patient/Caregiver Education Details Patient Name: Date of Service: [name], [name] 6/14/2021andnbsp2:30 PM Medical Record Number: [id number] Patient Account Number: [id number] Date of Birth/Gender: Treating RN: 18-Apr-1941 (79 y.o. Kevin Weaver Primary Care Physician: Koren Shiver Other Clinician: Referring Physician: Treating Physician/Extender: Daine Gravel in Treatment: 1 Education Assessment Education Provided To: Patient Education Topics Provided Venous: Methods: Explain/Verbal Responses: Reinforcements needed, State content correctly Wound/Skin Impairment: Methods: Explain/Verbal Responses: Reinforcements needed, State content correctly Electronic Signature(s) Signed: 04/11/2020 5:45:30 PM By: Zenaida Deed RN, BSN Previous Signature: 04/10/2020 5:38:02 PM Version By: Zenaida Deed RN, BSN Entered By: Zenaida Deed on 04/11/2020 07:23:59 -------------------------------------------------------------------------------- Wound Assessment Details Patient Name: Date of Service: [name], [name] 04/10/2020 2:30 PM Medical Record Number: [id number] Patient Account Number: [id number] Date of Birth/Sex: Treating RN: Aug 15, 1941 (79 y.o. Kevin Weaver Primary Care Michaelanthony Kempton: Koren Shiver Other Clinician: Referring Keva Darty: Treating Verlee Pope/Extender: Langley Adie, Irma Weeks in Treatment: 1 Wound Status Wound Number: 1 Primary Etiology: Venous Leg Ulcer Wound Location: Right, Lateral Lower Leg Wound Status: Open Wounding Event: Gradually Appeared Comorbid History: Hypertension, Type II Diabetes Date Acquired: 12/27/2019 Weeks Of Treatment: 1 Clustered Wound: Yes Wound Measurements Length: (cm) 1.7 Width: (cm) 2 Depth: (cm) 0.1 Clustered Quantity:  3 Area: (cm) 2.67 Volume: (cm) 0.267 % Reduction in Area: 83.2% % Reduction in Volume: 83.2% Epithelialization: None Tunneling: No Undermining: No Wound Description Classification: Full Thickness Without Exposed Support Structures Wound Margin: Flat and Intact Exudate Amount: Medium Exudate Type: Serosanguineous Exudate Color: red, brown Foul Odor After Cleansing: No Slough/Fibrino Yes Wound Bed Granulation Amount: Medium (34-66%) Exposed Structure Granulation Quality: Red Fascia Exposed: No Necrotic Amount: Medium (34-66%) Fat Layer (Subcutaneous Tissue) Exposed: Yes  Necrotic Quality: Adherent Slough Tendon Exposed: No Muscle Exposed: No Joint Exposed: No Bone Exposed: No Treatment Notes Wound #1 (Right, Lateral Lower Leg) 1. Cleanse With Wound Cleanser Soap and water 3. Primary Dressing Applied Calcium Alginate Ag 4. Secondary Dressing ABD Pad Dry Gauze 6. Support Layer Applied 3 layer compression wrap Notes netting Electronic Signature(s) Signed: 04/11/2020 5:45:30 PM By: [name] RN, BSN Previous Signature: 04/10/2020 5:38:02 PM Version By: [name] RN, BSN Entered By: [name] on 04/11/2020 07:22:48 -------------------------------------------------------------------------------- Wound Assessment Details Patient Name: Date of Service: [name], [name] 04/10/2020 2:30 PM Medical Record Number: [id number] Patient Account Number: [id number] Date of Birth/Sex: Treating RN: [birth year] (79 y.o. [name] Primary Care Cadden Elizondo: [name] Other Clinician: Referring Rucker Pridgeon: Treating Emon Lance/Extender: Levester Fresh, Irma Weeks in Treatment: 1 Wound Status Wound Number: 2 Primary Etiology: Diabetic Wound/Ulcer of the Lower Extremity Wound Location: Right, Lateral, Posterior Lower Leg Wound Status: Open Wounding Event: Gradually Appeared Comorbid History: Hypertension, Type II Diabetes Date Acquired:  04/10/2020 Weeks Of Treatment: 0 Clustered Wound: No Wound Measurements Length: (cm) 2 Width: (cm) 1 Depth: (cm) 0.1 Area: (cm) 1.571 Volume: (cm) 0.157 % Reduction in Area: 0% % Reduction in Volume: 0% Epithelialization: None Tunneling: No Undermining: No Wound Description Classification: Grade 2 Exudate Amount: Medium Exudate Type: Serous Exudate Color: amber Foul Odor After Cleansing: No Slough/Fibrino Yes Wound Bed Granulation Amount: Medium (34-66%) Exposed Structure Granulation Quality: Red Fascia Exposed: No Necrotic Amount: Medium (34-66%) Fat Layer (Subcutaneous Tissue) Exposed: No Necrotic Quality: Adherent Slough Tendon Exposed: No Muscle Exposed: No Joint Exposed: No Bone Exposed: No Treatment Notes Wound #2 (Right, Lateral, Posterior Lower Leg) 1. Cleanse With Wound Cleanser Soap and water 3. Primary Dressing Applied Calcium Alginate Ag 4. Secondary Dressing ABD Pad Dry Gauze 6. Support Layer Applied 3 layer compression wrap Notes netting Electronic Signature(s) Signed: 04/11/2020 5:45:30 PM By: [name] RN, BSN Previous Signature: 04/10/2020 5:38:02 PM Version By: [name] RN, BSN Entered By: [name] on 04/11/2020 07:23:14 -------------------------------------------------------------------------------- [place] Details Patient Name: Date of Service: [name], [name] 04/10/2020 2:30 PM Medical Record Number: [id number] Patient Account Number: [id number] Date of Birth/Sex: Treating RN: 10/20/1941 (79 y.o. [name] Primary Care Sona Nations: [name] Other Clinician: Referring Aloura Matsuoka: Treating Tejuan Gholson/Extender: Levester Fresh, Irma Weeks in Treatment: 1 Vital Signs Time Taken: 15:58 Temperature (F): 98.5 Height (in): 66 Pulse (bpm): 76 Weight (lbs): 232 Respiratory Rate (breaths/min): 18 Body Mass Index (BMI): 37.4 Blood Pressure (mmHg): 143/96 Reference Range: 80 - 120 mg /  dl Electronic Signature(s) Signed: 04/11/2020 5:45:30 PM By: [name] RN, BSN Entered By: [name] on 04/11/2020 07:21:02

## 2020-04-13 ENCOUNTER — Encounter: Payer: Medicare Other | Admitting: Family Medicine

## 2020-04-17 ENCOUNTER — Encounter (HOSPITAL_BASED_OUTPATIENT_CLINIC_OR_DEPARTMENT_OTHER): Payer: Medicare Other | Admitting: Internal Medicine

## 2020-04-17 DIAGNOSIS — E11622 Type 2 diabetes mellitus with other skin ulcer: Secondary | ICD-10-CM | POA: Diagnosis not present

## 2020-04-18 NOTE — Progress Notes (Addendum)
Kevin Weaver, Kevin Weaver ([id number]) Visit Report for 04/17/2020 Arrival Information Details Patient Name: Date of Service: [name], [name] 04/17/2020 8:00 A M Medical Record Number: [id number] Patient Account Number: [id number] Date of Birth/Sex: Treating RN: 04/10/1941 (79 y.o. Kevin Weaver Primary Care Evaline Weaver: Koren Shiver Other Clinician: Referring Trachelle Low: Treating Luciel Brickman/Extender: Melynda Ripple in Treatment: 2 Visit Information History Since Last Visit Added or deleted any medications: No Patient Arrived: Ambulatory Any new allergies or adverse reactions: No Arrival Time: 08:10 Had a fall or experienced change in No Accompanied By: self activities of daily living that may affect Transfer Assistance: None risk of falls: Patient Identification Verified: Yes Signs or symptoms of abuse/neglect since last visito No Secondary Verification Process Completed: Yes Hospitalized since last visit: No Patient Requires Transmission-Based Precautions: No Implantable device outside of the clinic excluding No Patient Has Alerts: Yes cellular tissue based products placed in the center Patient Alerts: R ABI: 1.07 TBI: 0.38 since last visit: L ABI:1.11 TBI: 0.65 Has Dressing in Place as Prescribed: Yes July 2020 Has Compression in Place as Prescribed: Yes Pain Present Now: No Electronic Signature(s) Signed: 04/17/2020 6:16:49 PM By: Cherylin Mylar Entered By: Cherylin Mylar on 04/17/2020 10:10:38 -------------------------------------------------------------------------------- Compression Therapy Details Patient Name: Date of Service: [name], [name] 04/17/2020 8:00 A M Medical Record Number: [id number] Patient Account Number: [id number] Date of Birth/Sex: Treating RN: Jun 26, 1941 (79 y.o. Kevin Weaver Primary Care Hancel Ion: Koren Shiver Other Clinician: Referring Rayquan Amrhein: Treating Kallie Depolo/Extender: Melynda Ripple in Treatment: 2 Compression Therapy Performed for Wound Assessment: Wound #1 Weaver,Lateral Lower Leg Performed By: Clinician Zandra Abts, RN Compression Type: Three Layer Post Procedure Diagnosis Same as Pre-procedure Electronic Signature(s) Signed: 04/18/2020 5:24:50 PM By: Zandra Abts RN, BSN Entered By: Zandra Abts on 04/17/2020 16:11:34 -------------------------------------------------------------------------------- Compression Therapy Details Patient Name: Date of Service: [name], [name] 04/17/2020 8:00 A M Medical Record Number: [id number] Patient Account Number: [id number] Date of Birth/Sex: Treating RN: 1941/01/19 (79 y.o. Kevin Weaver Primary Care Renita Brocks: Koren Shiver Other Clinician: Referring Kami Kube: Treating Wilkes Potvin/Extender: Melynda Ripple in Treatment: 2 Compression Therapy Performed for Wound Assessment: Wound #2 Weaver,Lateral,Posterior Lower Leg Performed By: Clinician Zandra Abts, RN Compression Type: Three Layer Post Procedure Diagnosis Same as Pre-procedure Electronic Signature(s) Signed: 04/18/2020 5:24:50 PM By: Zandra Abts RN, BSN Entered By: Zandra Abts on 04/17/2020 16:11:34 -------------------------------------------------------------------------------- Encounter Discharge Information Details Patient Name: Date of Service: [name], [name] 04/17/2020 8:00 A M Medical Record Number: [id number] Patient Account Number: [id number] Date of Birth/Sex: Treating RN: Jul 31, 1941 (79 y.o. Kevin Weaver Primary Care Lesleyanne Politte: Koren Shiver Other Clinician: Referring Flonnie Wierman: Treating Lissette Schenk/Extender: Melynda Ripple in Treatment: 2 Encounter Discharge Information Items Discharge Condition: Stable Ambulatory Status: Ambulatory Discharge Destination: Home Transportation: Private Auto Accompanied By: self Schedule Follow-up Appointment: Yes Clinical Summary of  Care: Electronic Signature(s) Signed: 04/17/2020 6:25:00 PM By: Shawn Stall Entered By: Shawn Stall on 04/17/2020 16:40:04 -------------------------------------------------------------------------------- Lower Extremity Assessment Details Patient Name: Date of Service: [name], [name] 04/17/2020 8:00 A M Medical Record Number: [id number] Patient Account Number: [id number] Date of Birth/Sex: Treating RN: 09/24/1941 (79 y.o. Kevin Weaver Primary Care Anaysia Germer: Koren Shiver Other Clinician: Referring Sophia Sperry: Treating Iceis Knab/Extender: Melynda Ripple in Treatment: 2 Edema Assessment Assessed: [Left: No] [Weaver: No] E[Left: dema] [Weaver: :] Calf Left: Weaver: Point of Measurement: 42 cm From Medial Instep cm 35  cm Ankle Left: Weaver: Point of Measurement: 11 cm From Medial Instep cm 24.5 cm Vascular Assessment Pulses: Dorsalis Pedis Palpable: [Weaver:Yes] Electronic Signature(s) Signed: 04/17/2020 6:16:49 PM By: Cherylin Mylar Entered By: Cherylin Mylar on 04/17/2020 10:12:03 -------------------------------------------------------------------------------- Pain Assessment Details Patient Name: Date of Service: [name], [name] 04/17/2020 8:00 A M Medical Record Number: [id number] Patient Account Number: [id number] Date of Birth/Sex: Treating RN: 12/06/1940 (79 y.o. Kevin Weaver Primary Care Yago Ludvigsen: Koren Shiver Other Clinician: Referring Tavie Haseman: Treating Fitz Matsuo/Extender: Melynda Ripple in Treatment: 2 Active Problems Location of Pain Severity and Description of Pain Patient Has Paino No Site Locations Pain Management and Medication Current Pain Management: Electronic Signature(s) Signed: 04/17/2020 6:16:49 PM By: Cherylin Mylar Entered By: Cherylin Mylar on 04/17/2020 10:11:06 -------------------------------------------------------------------------------- Wound Assessment  Details Patient Name: Date of Service: [name], [name] 04/17/2020 8:00 A M Medical Record Number: [id number] Patient Account Number: [id number] Date of Birth/Sex: Treating RN: Mar 01, 1941 (79 y.o. Kevin Weaver Primary Care Dia Jefferys: Koren Shiver Other Clinician: Referring Sharlena Kristensen: Treating Carnell Casamento/Extender: Melynda Ripple in Treatment: 2 Wound Status Wound Number: 1 Primary Etiology: Venous Leg Ulcer Wound Location: Weaver, Lateral Lower Leg Wound Status: Open Wounding Event: Gradually Appeared Comorbid History: Hypertension, Type II Diabetes Date Acquired: 12/27/2019 Weeks Of Treatment: 2 Clustered Wound: Yes Wound Measurements Length: (cm) 1.1 Width: (cm) 1.4 Depth: (cm) 0.1 Clustered Quantity: 3 Area: (cm) 1.21 Volume: (cm) 0.121 % Reduction in Area: 92.4% % Reduction in Volume: 92.4% Epithelialization: None Tunneling: No Undermining: No Wound Description Classification: Full Thickness Without Exposed Support Structures Wound Margin: Flat and Intact Exudate Amount: Medium Exudate Type: Serosanguineous Exudate Color: red, brown Foul Odor After Cleansing: No Slough/Fibrino Yes Wound Bed Granulation Amount: Large (67-100%) Exposed Structure Granulation Quality: Red Fascia Exposed: No Necrotic Amount: Small (1-33%) Fat Layer (Subcutaneous Tissue) Exposed: Yes Necrotic Quality: Adherent Slough Tendon Exposed: No Muscle Exposed: No Joint Exposed: No Bone Exposed: No Treatment Notes Wound #1 (Weaver, Lateral Lower Leg) 1. Cleanse With Wound Cleanser Soap and water 2. Periwound Care Moisturizing lotion TCA Cream 3. Primary Dressing Applied Calcium Alginate Ag 4. Secondary Dressing ABD Pad 6. Support Layer Applied 3 layer compression wrap Notes netting. Electronic Signature(s) Signed: 04/19/2020 5:20:20 PM By: Marlinda Mike Signed: 04/20/2020 5:29:15 PM By: Cherylin Mylar Previous Signature: 04/17/2020 6:16:49 PM  Version By: Cherylin Mylar Entered By: Marlinda Mike on 04/19/2020 08:23:07 -------------------------------------------------------------------------------- Wound Assessment Details Patient Name: Date of Service: [name], [name] 04/17/2020 8:00 A M Medical Record Number: [id number] Patient Account Number: [id number] Date of Birth/Sex: Treating RN: 11-30-1940 (79 y.o. Kevin Weaver Primary Care Shantavia Jha: Koren Shiver Other Clinician: Referring Shyla Gayheart: Treating Konni Kesinger/Extender: Melynda Ripple in Treatment: 2 Wound Status Wound Number: 2 Primary Etiology: Diabetic Wound/Ulcer of the Lower Extremity Wound Location: Weaver, Lateral, Posterior Lower Leg Wound Status: Open Wounding Event: Gradually Appeared Comorbid History: Hypertension, Type II Diabetes Date Acquired: 04/10/2020 Weeks Of Treatment: 1 Clustered Wound: No Photos Wound Measurements Length: (cm) 1.3 Width: (cm) 0.7 Depth: (cm) 0.1 Area: (cm) 0.715 Volume: (cm) 0.071 % Reduction in Area: 54.5% % Reduction in Volume: 54.8% Epithelialization: None Tunneling: No Undermining: No Wound Description Classification: Grade 2 Wound Margin: Distinct, outline attached Exudate Amount: Medium Exudate Type: Serosanguineous Exudate Color: red, brown Foul Odor After Cleansing: No Slough/Fibrino No Wound Bed Granulation Amount: Large (67-100%) Exposed Structure Granulation Quality: Red Fascia Exposed: No Necrotic Amount: None Present (0%) Fat Layer (Subcutaneous Tissue)  Exposed: Yes Tendon Exposed: No Muscle Exposed: No Joint Exposed: No Bone Exposed: No Treatment Notes Wound #2 (Weaver, Lateral, Posterior Lower Leg) 1. Cleanse With Wound Cleanser Soap and water 2. Periwound Care Moisturizing lotion TCA Cream 3. Primary Dressing Applied Calcium Alginate Ag 4. Secondary Dressing ABD Pad 6. Support Layer Applied 3 layer compression wrap Notes netting. Electronic  Signature(s) Signed: 04/19/2020 5:20:20 PM By: Minerva Fester Signed: 04/20/2020 5:29:15 PM By: [name] Previous Signature: 04/17/2020 6:16:49 PM Version By: [name] Entered By: Minerva Fester on 04/19/2020 08:21:37 -------------------------------------------------------------------------------- Vitals Details Patient Name: Date of Service: [name], [name] 04/17/2020 8:00 A M Medical Record Number: [id number] Patient Account Number: [id number] Date of Birth/Sex: Treating RN: 03/09/1941 (79 y.o. [name] Primary Care Aerin Delany: [name] Other Clinician: Referring Dalbert Stillings: Treating Alize Acy/Extender: [name] in Treatment: 2 Vital Signs Time Taken: 08:10 Temperature (F): 98.1 Height (in): 66 Pulse (bpm): 59 Weight (lbs): 232 Respiratory Rate (breaths/min): 18 Body Mass Index (BMI): 37.4 Blood Pressure (mmHg): 172/81 Reference Range: 80 - 120 mg / dl Electronic Signature(s) Signed: 04/17/2020 6:16:49 PM By: [name] Entered By: [name] on 04/17/2020 10:11:01

## 2020-04-18 NOTE — Progress Notes (Signed)
Kevin Weaver, Kevin Weaver ([id number]) Visit Report for 04/17/2020 HPI Details Patient Name: Date of Service: [name], [name] 04/17/2020 8:00 A M Medical Record Number: [id number] Patient Account Number: [id number] Date of Birth/Sex: Treating RN: 10-03-1941 (79 y.o. Kevin Weaver Primary Care Provider: Koren Shiver Other Clinician: Referring Provider: Treating Provider/Extender: Gerome Apley, Ruben Reason in Treatment: 2 History of Present Illness HPI Description: ADMISSION 04/03/20 This is a 79 year old man who states he has been dealing with blistering on his lower legs since Thanksgiving of last year and remaining wounds on the right lateral lower leg for the last several months. He says he has had a lot of pain to uncomfortable. He was seen by his primary doctor on 5/25 diagnosed with cellulitis and an ulcer on his right ankle who was given Keflex and some ointment but the patient does not remember what that was. He was felt to have a stasis ulcer. The patient had arterial studies done in July 2020. These showed an ABI of 1.07 on the right but a TBI of only 0.38 with monophasic waveforms. On the left he had an ABI of 1.11 with a TBI of 0.67 and triphasic waveforms. Past medical history includes type 2 diabetes, hypertension, hyperlipidemia, leg swelling, PAD, right foot trauma at age 54 where his foot was caught in a lawn more. Left him with a permanent inversion at the right ankle. He had right arm trauma catching his hand in a piece of farm equipment and lost all of his fingers except his thumb and part of his second finger. 04/10/2020 upon evaluation today patient appears to be doing well upon evaluation today in regard to his wounds on his leg. We have actually divided those in the 2 because they were doing as well as they are. Fortunately there is no signs of active infection at this time which is great news. I do believe the compression wrap is also been of benefit. 6/21;  patient has 2 small open areas on the right lateral lower leg one laterally and one more posterior. Neither one of them look like they required debridement we've been using silver alginate under 3 alert compression. he still has not heard anything from vascular waiver repeating his arterial studies as well as venous reflux studies on the right leg Electronic Signature(s) Signed: 04/17/2020 6:03:41 PM By: Baltazar Najjar MD Entered By: Baltazar Najjar on 04/17/2020 08:47:05 -------------------------------------------------------------------------------- Physical Exam Details Patient Name: Date of Service: [name], [name] 04/17/2020 8:00 A M Medical Record Number: [id number] Patient Account Number: [id number] Date of Birth/Sex: Treating RN: 1941/01/15 (79 y.o. Kevin Weaver Primary Care Provider: Koren Shiver Other Clinician: Referring Provider: Treating Provider/Extender: Melynda Ripple in Treatment: 2 Cardiovascular I cannot feel a dorsalis pedis posterior tibial or popliteal pulse on the right. Notes wound exam; these look like venous insufficiency wounds. Some surrounding stasis dermatitis around the wound area. The more anterior of the 2 wounds I think has a bilevel surface although careful attention to that next week certainly the more posterior area appears viable Electronic Signature(s) Signed: 04/17/2020 6:03:41 PM By: Baltazar Najjar MD Entered By: Baltazar Najjar on 04/17/2020 08:48:19 -------------------------------------------------------------------------------- Physician Orders Details Patient Name: Date of Service: [name], [name] 04/17/2020 8:00 A M Medical Record Number: [id number] Patient Account Number: [id number] Date of Birth/Sex: Treating RN: Mar 23, 1941 (79 y.o. 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 Dressing Change Frequency Wound #1 Right,Lateral Lower Leg Do not change entire dressing for one week. Skin Barriers/Peri-Wound Care Wound #1 Right,Lateral Lower Leg Moisturizing lotion TCA Cream or Ointment - mixed with lotion Wound Cleansing May shower with protection. - may use cast protector Primary Wound Dressing Wound #1 Right,Lateral Lower Leg Calcium Alginate with Silver Secondary Dressing Wound #1 Right,Lateral Lower Leg Dry Gauze ABD pad Edema Control 3 Layer Compression System - Right Lower Extremity Avoid standing for long periods of time Elevate legs to the level of the heart or above for 30 minutes daily and/or when sitting, a frequency of: - throughout the day Exercise regularly Electronic Signature(s) Signed: 04/17/2020 6:03:41 PM By: Baltazar Najjar MD Signed: 04/18/2020 5:24:50 PM By: Zandra Abts RN, BSN Entered By: Zandra Abts on 04/17/2020 16:11:08 -------------------------------------------------------------------------------- Problem List Details Patient Name: Date of Service: [name], [name] 04/17/2020 8:00 A M Medical Record Number: [id number] Patient Account Number: [id number] Date of Birth/Sex: Treating RN: June 18, 1941 (79 y.o. Kevin Weaver Primary Care Provider: Koren Shiver Other Clinician: Referring Provider: Treating Provider/Extender: Melynda Ripple in Treatment: 2 Active Problems ICD-10 Encounter Code Description Active Date  MDM Diagnosis I87.331 Chronic venous hypertension (idiopathic) with ulcer and inflammation of right 04/03/2020 No Yes lower extremity L97.812 Non-pressure chronic ulcer of other part of right lower leg with fat layer 04/03/2020 No Yes exposed E11.622 Type 2 diabetes mellitus with other skin ulcer 04/03/2020 No Yes E11.51 Type 2 diabetes mellitus with diabetic peripheral angiopathy without gangrene 04/03/2020 No Yes Inactive Problems Resolved Problems Electronic Signature(s) Signed: 04/17/2020 6:03:41 PM By: Baltazar Najjar MD Entered By: Baltazar Najjar on 04/17/2020 08:45:24 -------------------------------------------------------------------------------- Progress Note Details Patient Name: Date of Service: [name], [name] 04/17/2020 8:00 A M Medical Record Number: [id number] Patient Account Number: [id number] Date of Birth/Sex: Treating RN: Oct 31, 1940 (79 y.o. Kevin Weaver Primary Care Provider: Koren Shiver Other Clinician: Referring Provider: Treating Provider/Extender: Gerome Apley, Ruben Reason in Treatment: 2 Subjective History of Present Illness (HPI) ADMISSION 04/03/20 This is a 79 year old man who states he has been dealing with blistering on his lower legs since Thanksgiving of last year and remaining wounds on the right lateral lower leg for the last several months. He says he has had a lot of pain to uncomfortable. He was seen by his primary doctor on 5/25 diagnosed with cellulitis and an ulcer on his right ankle who was given Keflex and some ointment but the patient does not remember what that was. He was felt to have a stasis ulcer. The patient had arterial studies done in July 2020. These showed an ABI of 1.07 on the right but a TBI of only 0.38 with monophasic waveforms. On the left he had an ABI of 1.11 with a TBI of 0.67 and triphasic waveforms. Past medical history includes type 2 diabetes, hypertension, hyperlipidemia, leg swelling, PAD, right foot  trauma at age 1 where his foot was caught in a lawn more. Left him with a permanent inversion at the right ankle. He had right arm trauma catching his  hand in a piece of farm equipment and lost all of his fingers except his thumb and part of his second finger. 04/10/2020 upon evaluation today patient appears to be doing well upon evaluation today in regard to his wounds on his leg. We have actually divided those in the 2 because they were doing as well as they are. Fortunately there is no signs of active infection at this time which is great news. I do believe the compression wrap is also been of benefit. 6/21; patient has 2 small open areas on the right lateral lower leg one laterally and one more posterior. Neither one of them look like they required debridement we've been using silver alginate under 3 alert compression. he still has not heard anything from vascular waiver repeating his arterial studies as well as venous reflux studies on the right leg Objective Constitutional Vitals Time Taken: 8:10 AM, Height: 66 in, Weight: 232 lbs, BMI: 37.4, Temperature: 98.1 F, Pulse: 59 bpm, Respiratory Rate: 18 breaths/min, Blood Pressure: 172/81 mmHg. Cardiovascular I cannot feel a dorsalis pedis posterior tibial or popliteal pulse on the right. General Notes: wound exam; these look like venous insufficiency wounds. Some surrounding stasis dermatitis around the wound area. The more anterior of the 2 wounds I think has a bilevel surface although careful attention to that next week certainly the more posterior area appears viable Integumentary (Hair, Skin) Wound #1 status is Open. Original cause of wound was Gradually Appeared. The wound is located on the Right,Lateral Lower Leg. The wound measures 1.1cm length x 1.4cm width x 0.1cm depth; 1.21cm^2 area and 0.121cm^3 volume. There is Fat Layer (Subcutaneous Tissue) Exposed exposed. There is no tunneling or undermining noted. There is a medium amount of  serosanguineous drainage noted. The wound margin is flat and intact. There is large (67-100%) red granulation within the wound bed. There is a small (1-33%) amount of necrotic tissue within the wound bed including Adherent Slough. Wound #2 status is Open. Original cause of wound was Gradually Appeared. The wound is located on the Right,Lateral,Posterior Lower Leg. The wound measures 1.3cm length x 0.7cm width x 0.1cm depth; 0.715cm^2 area and 0.071cm^3 volume. There is Fat Layer (Subcutaneous Tissue) Exposed exposed. There is no tunneling or undermining noted. There is a medium amount of serosanguineous drainage noted. The wound margin is distinct with the outline attached to the wound base. There is large (67-100%) red granulation within the wound bed. There is no necrotic tissue within the wound bed. 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 There was a Three Layer Compression Therapy Procedure by [name], RN. Post procedure Diagnosis Wound #1: Same as Pre-Procedure Wound #2 Pre-procedure diagnosis of Wound #2 is a Diabetic Wound/Ulcer of the Lower Extremity located on the Right,Lateral,Posterior Lower Leg . There was a Three Layer Compression Therapy Procedure by [name], RN. Post procedure Diagnosis Wound #2: Same as Pre-Procedure Plan Follow-up Appointments: Return Appointment in 1 week. Dressing Change Frequency: Wound #1 Right,Lateral Lower Leg: Do not change entire dressing for one week. Skin Barriers/Peri-Wound Care: Wound #1 Right,Lateral Lower  Leg: Moisturizing lotion TCA Cream or Ointment - mixed with lotion Wound  Cleansing: May shower with protection. - may use cast protector Primary Wound Dressing: Wound #1 Right,Lateral Lower Leg: Calcium Alginate with Silver Secondary Dressing: Wound #1 Right,Lateral Lower Leg: Dry Gauze ABD pad Edema Control: 3 Layer Compression System - Right Lower Extremity Avoid standing for long periods of time Elevate legs to the level of the heart or above for 30 minutes daily and/or when sitting, a frequency of: - throughout the day Exercise regularly 1 continue with silver alginate under 3 layer cpmpression 2 oVein and vascular/arterial and veinous studies Electronic Signature(s) Signed: 04/17/2020 6:03:41 PM By: Baltazar Najjar MD Signed: 04/18/2020 5:24:50 PM By: Zandra Abts RN, BSN Entered By: Zandra Abts on 04/17/2020 16:11:47 -------------------------------------------------------------------------------- SuperBill Details Patient Name: Date of Service: [name], [name] 04/17/2020 Medical Record Number: [id number] Patient Account Number: [id number] Date of Birth/Sex: Treating RN: 1941-07-17 (79 y.o. 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 E11.622 Type 2 diabetes mellitus with other skin ulcer E11.51 Type 2 diabetes mellitus with diabetic peripheral angiopathy without gangrene Facility Procedures CPT4 Code: [id number] Description: (Facility Use Only) [phone] - APPLY MULTLAY COMPRS LWR RT LEG Modifier: Quantity: 1 Physician Procedures : CPT4 Code Description Modifier [id number] - WC PHYS LEVEL 3 - EST  PT ICD-10 Diagnosis Description I87.331 Chronic venous hypertension (idiopathic) with ulcer and inflammation of right lower extremity L97.812 Non-pressure chronic ulcer of other part  of right lower leg with fat layer exposed E11.51 Type 2 diabetes mellitus with diabetic peripheral 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

## 2020-04-24 ENCOUNTER — Encounter (HOSPITAL_BASED_OUTPATIENT_CLINIC_OR_DEPARTMENT_OTHER): Payer: Medicare Other | Admitting: Internal Medicine

## 2020-04-24 DIAGNOSIS — E11622 Type 2 diabetes mellitus with other skin ulcer: Secondary | ICD-10-CM | POA: Diagnosis not present

## 2020-04-24 NOTE — Progress Notes (Signed)
KUNAAL, WALKINS ([id number]) Visit Report for 04/24/2020 HPI Details Patient Name: Date of Service: [name], [name] 04/24/2020 8:30 A M Medical Record Number: [id number] Patient Account Number: [id number] Date of Birth/Sex: Treating RN: 11-23-1940 (79 y.o. [name] Primary Care Provider: Koren Shiver Other Clinician: Referring Provider: Treating Provider/Extender: Melynda Ripple in Treatment: 3 History of Present Illness HPI Description: ADMISSION 04/03/20 This is a 79 year old man who states he has been dealing with blistering on his lower legs since Thanksgiving of last year and remaining wounds on the right lateral lower leg for the last several months. He says he has had a lot of pain to uncomfortable. He was seen by his primary doctor on 5/25 diagnosed with cellulitis and an ulcer on his right ankle who was given Keflex and some ointment but the patient does not remember what that was. He was felt to have a stasis ulcer. The patient had arterial studies done in July 2020. These showed an ABI of 1.07 on the right but a TBI of only 0.38 with monophasic waveforms. On the left he had an ABI of 1.11 with a TBI of 0.67 and triphasic waveforms. Past medical history includes type 2 diabetes, hypertension, hyperlipidemia, leg swelling, PAD, right foot trauma at age 57 where his foot was caught in a lawn more. Left him with a permanent inversion at the right ankle. He had right arm trauma catching his hand in a piece of farm equipment and lost all of his fingers except his thumb and part of his second finger. 04/10/2020 upon evaluation today patient appears to be doing well upon evaluation today in regard to his wounds on his leg. We have actually divided those in the 2 because they were doing as well as they are. Fortunately there is no signs of active infection at this time which is great news. I do believe the compression wrap is also been of benefit. 6/21;  patient has 2 small open areas on the right lateral lower leg one laterally and one more posterior. Neither one of them look like they required debridement we've been using silver alginate under 3 alert compression. he still has not heard anything from vascular waiver repeating his arterial studies as well as venous reflux studies on the right leg 6/28; patient's wound on the right posterior is closed he still has a very superficial tiny area on the right lateral calf. This does not look ominous. This is in the setting of chronic venous information with some degree of stasis dermatitis and hemosiderin deposition. He tells me that he has stockings at home he is going to bring those in next week. We will give him his measurements and the contact information for elastic therapy in [place]. Finally he has appointments for venous reflux studies and arterial studies on [facility] I suspect he will be discharged from here by then Electronic Signature(s) Signed: 04/24/2020 5:46:39 PM By: Baltazar Najjar MD Entered By: Baltazar Najjar on 04/24/2020 09:14:01 -------------------------------------------------------------------------------- Physical Exam Details Patient Name: Date of Service: [name], [name] 04/24/2020 8:30 A M Medical Record Number: [id number] Patient Account Number: [id number] Date of Birth/Sex: Treating RN: 1941/08/02 (79 y.o. [name] Primary Care Provider: Koren Shiver Other Clinician: Referring Provider: Treating Provider/Extender: Melynda Ripple in Treatment: 3 Cardiovascular Needle pulses are difficult to feel on the right but foot foot is warm. We have excellent edema control. Skin changes of chronic venous  stasis hemosiderin deposition. Notes Wound exam; venous insufficiency wounds with surrounding stasis dermatitis and hemosiderin deposition we have excellent edema control. The area on the right posterior calf is closed  carefully inspected. On the right lateral calf he is just about closed 95+ percent of the area is actually closed small superficial area remaining. No evidence of infection Electronic Signature(s) Signed: 04/24/2020 5:46:39 PM By: Baltazar Najjar MD Entered By: Baltazar Najjar on 04/24/2020 09:15:28 -------------------------------------------------------------------------------- Physician Orders Details Patient Name: Date of Service: [name], [name] 04/24/2020 8:30 A M Medical Record Number: [id number] Patient Account Number: [id number] Date of Birth/Sex: Treating RN: 04/15/1941 (79 y.o. [name] Primary Care Provider: Koren Shiver Other Clinician: Referring Provider: Treating Provider/Extender: Melynda Ripple in Treatment: 3 Verbal / Phone Orders: No Diagnosis Coding ICD-10 Coding Code Description [id number] Chronic venous hypertension (idiopathic) with ulcer and inflammation of right lower extremity 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sed E11.622 Type 2 diabetes mellitus with other skin ulcer E11.51 Type 2 diabetes mellitus with diabetic peripheral angiopathy without gangrene Follow-up Appointments Return Appointment in 1 week. Dressing Change Frequency Wound #1 Right,Lateral Lower Leg Do not change entire dressing for one week. Skin Barriers/Peri-Wound Care Wound #1 Right,Lateral Lower Leg Moisturizing lotion TCA Cream or Ointment - mixed with lotion Wound Cleansing May shower with protection. - may use cast protector Primary Wound Dressing Wound #1 Right,Lateral Lower Leg Calcium Alginate with Silver Secondary Dressing Wound #1 Right,Lateral Lower Leg Dry Gauze Edema Control 3 Layer Compression System - Right Lower Extremity Avoid standing for long periods of time Elevate legs to the level of the heart or above for 30 minutes daily and/or when sitting, a frequency of: - throughout the  day Exercise regularly Electronic Signature(s) Signed: 04/24/2020 5:46:39 PM By: Baltazar Najjar MD Signed: 04/24/2020 6:01:25 PM By: Zandra Abts RN, BSN Entered By: Zandra Abts on 04/24/2020 09:09:34 -------------------------------------------------------------------------------- Problem List Details Patient Name: Date of Service: [name], [name] 04/24/2020 8:30 A M Medical Record Number: [id number] Patient Account Number: [id number] Date of Birth/Sex: Treating RN: 06-Aug-1941 (79 y.o. [name] Primary Care Provider: Koren Shiver Other Clinician: Referring Provider: Treating Provider/Extender: Melynda Ripple in Treatment: 3 Active Problems ICD-10 Encounter Code Description Active Date MDM Diagnosis I87.331 Chronic venous hypertension (idiopathic) with ulcer and inflammation of right 04/03/2020 No Yes lower extremity L97.812 Non-pressure chronic ulcer of other part of right lower leg with fat layer 04/03/2020 No Yes exposed E11.622 Type 2 diabetes mellitus with other skin ulcer 04/03/2020 No Yes E11.51 Type 2 diabetes mellitus with diabetic peripheral angiopathy without gangrene 04/03/2020 No Yes Inactive Problems Resolved Problems Electronic Signature(s) Signed: 04/24/2020 5:46:39 PM By: Baltazar Najjar MD Entered By: Baltazar Najjar on 04/24/2020 09:12:28 -------------------------------------------------------------------------------- Progress Note Details Patient Name: Date of Service: [name], [name] 04/24/2020 8:30 A M Medical Record Number: [id number] Patient Account Number: [id number] Date of Birth/Sex: Treating RN: 27-Sep-1941 (79 y.o. [name] Primary Care Provider: Koren Shiver Other Clinician: Referring Provider: Treating Provider/Extender: Gerome Apley, Ruben Reason in Treatment: 3 Subjective History of Present Illness (HPI) ADMISSION 04/03/20 This is a 79 year old man who states he has been dealing  with blistering on his lower legs since Thanksgiving of last year and remaining wounds on the right lateral lower leg for the last several months. He says he has had a lot of pain to uncomfortable. He was seen by  his primary doctor on 5/25 diagnosed with cellulitis and an ulcer on his right ankle who was given Keflex and some ointment but the patient does not remember what that was. He was felt to have a stasis ulcer. The patient had arterial studies done in July 2020. These showed an ABI of 1.07 on the right but a TBI of only 0.38 with monophasic waveforms. On the left he had an ABI of 1.11 with a TBI of 0.67 and triphasic waveforms. Past medical history includes type 2 diabetes, hypertension, hyperlipidemia, leg swelling, PAD, right foot trauma at age 79 where his foot was caught in a lawn more. Left him with a permanent inversion at the right ankle. He had right arm trauma catching his hand in a piece of farm equipment and lost all of his fingers except his thumb and part of his second finger. 04/10/2020 upon evaluation today patient appears to be doing well upon evaluation today in regard to his wounds on his leg. We have actually divided those in the 2 because they were doing as well as they are. Fortunately there is no signs of active infection at this time which is great news. I do believe the compression wrap is also been of benefit. 6/21; patient has 2 small open areas on the right lateral lower leg one laterally and one more posterior. Neither one of them look like they required debridement we've been using silver alginate under 3 alert compression. he still has not heard anything from vascular waiver repeating his arterial studies as well as venous reflux studies on the right leg 6/28; patient's wound on the right posterior is closed he still has a very superficial tiny area on the right lateral calf. This does not look ominous. This is in the setting of chronic venous information with some  degree of stasis dermatitis and hemosiderin deposition. He tells me that he has stockings at home he is going to bring those in next week. We will give him his measurements and the contact information for elastic therapy in [place]. Finally he has appointments for venous reflux studies and arterial studies on [facility] I suspect he will be discharged from here by then Objective Constitutional Vitals Time Taken: 8:40 AM, Height: 66 in, Weight: 232 lbs, BMI: 37.4, Temperature: 98.3 F, Pulse: 54 bpm, Respiratory Rate: 19 breaths/min, Blood Pressure: 142/86 mmHg. Cardiovascular Needle pulses are difficult to feel on the right but foot foot is warm. We have excellent edema control. Skin changes of chronic venous stasis hemosiderin deposition. General Notes: Wound exam; venous insufficiency wounds with surrounding stasis dermatitis and hemosiderin deposition we have excellent edema control. The area on the right posterior calf is closed carefully inspected. On the right lateral calf he is just about closed 95+ percent of the area is actually closed small superficial area remaining. No evidence of infection Integumentary (Hair, Skin) Wound #1 status is Open. Original cause of wound was Gradually Appeared. The wound is located on the Right,Lateral Lower Leg. The wound measures 0.5cm length x 0.3cm width x 0.1cm depth; 0.118cm^2 area and 0.012cm^3 volume. There is Fat Layer (Subcutaneous Tissue) Exposed exposed. There is no tunneling or undermining noted. There is a medium amount of serosanguineous drainage noted. The wound margin is flat and intact. There is large (67-100%) red, pink granulation within the wound bed. There is no necrotic tissue within the wound bed. Wound #2 status is Healed - Epithelialized. Original cause of wound was Gradually Appeared. The wound is  located on the Right,Lateral,Posterior Lower Leg. The wound measures 0cm length x 0cm width x 0cm depth; 0cm^2 area  and 0cm^3 volume. There is no tunneling or undermining noted. There is a none present amount of drainage noted. The wound margin is distinct with the outline attached to the wound base. There is no granulation within the wound bed. There is no necrotic tissue within the wound bed. 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 There was a Three Layer Compression Therapy Procedure by Zandra Abts, RN. Post procedure Diagnosis Wound #1: Same as Pre-Procedure Plan Follow-up Appointments: Return Appointment in 1 week. Dressing Change Frequency: Wound #1 Right,Lateral Lower Leg: Do not change entire dressing for one week. Skin Barriers/Peri-Wound Care: Wound #1 Right,Lateral Lower Leg: Moisturizing lotion TCA Cream or Ointment - mixed with lotion Wound Cleansing: May shower with protection. - may use cast protector Primary Wound Dressing: Wound #1 Right,Lateral Lower Leg: Calcium Alginate with Silver Secondary Dressing: Wound #1 Right,Lateral Lower Leg: Dry Gauze Edema Control: 3 Layer Compression System - Right Lower Extremity Avoid standing for long periods of time Elevate legs to the level of the heart or above for 30 minutes daily and/or when sitting, a frequency of: - throughout the day Exercise regularly 1. We will continue with silver alginate and 3 layer compression which she is tolerating well 2. He is going to bring in his stockings that he has at home next week. We have also given him the contact information for elastic therapy 3. [facility] studies both venous  and arterial on 7/23 Electronic Signature(s) Signed: 04/24/2020 5:46:39 PM By: Baltazar Najjar MD Entered By: Baltazar Najjar on 04/24/2020 09:17:05 -------------------------------------------------------------------------------- SuperBill Details Patient Name: Date of Service: [name], [name] 04/24/2020 Medical Record Number: [id number] Patient Account Number: [id number] Date of Birth/Sex: Treating RN: 02/27/1941 (79 y.o. [name] Primary Care Provider: Koren Shiver Other Clinician: Referring Provider: Treating Provider/Extender: Melynda Ripple in Treatment: 3 Diagnosis Coding ICD-10 Codes Code Description [phone] Chronic venous hypertension (idiopathic) with ulcer and inflammation of right lower extremity 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sed E11.622 Type 2 diabetes mellitus with other skin ulcer E11.51 Type 2 diabetes mellitus with diabetic peripheral angiopathy without gangrene Facility Procedures CPT4 Code: [id number] Description: (Facility Use Only) [phone] - APPLY MULTLAY COMPRS LWR RT LEG Modifier: Quantity: 1 Physician Procedures Electronic Signature(s) Signed: 04/24/2020 5:46:39 PM By: Baltazar Najjar MD Entered By: Baltazar Najjar on 04/24/2020 09:17:29

## 2020-04-25 NOTE — Progress Notes (Signed)
Kevin Weaver, Kevin Weaver ([id number]) Visit Report for 04/24/2020 Arrival Information Details Patient Name: Date of Service: [name], [name] 04/24/2020 8:30 A M Medical Record Number: [id number] Patient Account Number: [id number] Date of Birth/Sex: Treating RN: Kevin Weaver (79 y.o. Kevin Weaver Primary Care Kevin Weaver: Kevin Weaver Other Clinician: Referring Kevin Weaver: Treating Kevin Weaver/Extender: Kevin Weaver in Treatment: 3 Visit Information History Since Last Visit Added or deleted any medications: No Patient Arrived: Ambulatory Any new allergies or adverse reactions: No Arrival Time: 08:38 Had a fall or experienced change in No Accompanied By: self activities of daily living that may affect Transfer Assistance: None risk of falls: Patient Identification Verified: Yes Signs or symptoms of abuse/neglect since last visito No Secondary Verification Process Completed: Yes Hospitalized since last visit: No Patient Requires Transmission-Based Precautions: No Implantable device outside of the clinic excluding No Patient Has Alerts: Yes cellular tissue based products placed in the center Patient Alerts: R ABI: 1.07 TBI: 0.38 since last visit: L ABI:1.11 TBI: 0.65 Has Dressing in Place as Prescribed: Yes July 2020 Has Compression in Place as Prescribed: Yes Pain Present Now: No Electronic Signature(s) Signed: 04/24/2020 5:58:35 PM By: Kevin Weaver Entered By: Kevin Weaver on 04/24/2020 08:40:47 -------------------------------------------------------------------------------- Compression Therapy Details Patient Name: Date of Service: [name], [name] 04/24/2020 8:30 A M Medical Record Number: [id number] Patient Account Number: [id number] Date of Birth/Sex: Treating RN: [birth year] (79 y.o. Kevin Weaver Primary Care Kevin Weaver: Kevin Weaver Other Clinician: Referring Annaleigha Woo: Treating [name]/Extender: Kevin Weaver in Treatment: 3 Compression Therapy Performed for Wound Assessment: Wound #1 Right,Lateral Lower Leg Performed By: Clinician Kevin Hurst, RN Compression Type: Three Layer Post Procedure Diagnosis Same as Pre-procedure Electronic Signature(s) Signed: 04/24/2020 6:01:25 PM By: Kevin Weaver Entered By: Kevin Weaver on 04/24/2020 09:10:12 -------------------------------------------------------------------------------- Encounter Discharge Information Details Patient Name: Date of Service: [name], [name] 04/24/2020 8:30 A M Medical Record Number: [id number] Patient Account Number: [id number] Date of Birth/Sex: Treating RN: [birth year] (79 y.o. Kevin Weaver Primary Care Kevin Weaver: Kevin Weaver Other Clinician: Referring Kevin Weaver: Treating Kevin Weaver/Extender: Kevin Weaver in Treatment: 3 Encounter Discharge Information Items Discharge Condition: Stable Ambulatory Status: Ambulatory Discharge Destination: Home Transportation: Private Auto Accompanied By: self Schedule Follow-up Appointment: Yes Clinical Summary of Care: Patient Declined Electronic Signature(s) Signed: 04/25/2020 5:33:24 PM By: Kevin Gouty RN, Weaver Entered By: Kevin Weaver on 04/24/2020 09:28:59 -------------------------------------------------------------------------------- Lower Extremity Assessment Details Patient Name: Date of Service: [name], [name] 04/24/2020 8:30 A M Medical Record Number: [id number] Patient Account Number: [id number] Date of Birth/Sex: Treating RN: [birth year] (79 y.o. Kevin Weaver Primary Care [name]: Kevin Weaver Other Clinician: Referring Kevin Weaver: Treating Kevin Weaver/Extender: Kevin Weaver in Treatment: 3 Edema Assessment Assessed: [Left: No] [Right: No] Edema: [Left: Yes] [Right: Yes] Calf Left: Right: Point of Measurement: 42 cm From Medial Instep 38 cm 31 cm Ankle Left:  Right: Point of Measurement: 11 cm From Medial Instep 25 cm 24 cm Vascular Assessment Pulses: Dorsalis Pedis Palpable: [Right:Yes Yes] Electronic Signature(s) Signed: 04/24/2020 5:58:35 PM By: Kevin Weaver Signed: 04/24/2020 6:01:25 PM By: Kevin Weaver Entered By: Kevin Weaver on 04/24/2020 09:11:52 -------------------------------------------------------------------------------- Multi Wound Chart Details Patient Name: Date of Service: [name], [name] 04/24/2020 8:30 A M Medical Record Number: [id number] Patient Account Number: [id number] Date of Birth/Sex: Treating RN: [birth year] (79 y.o. Kevin Weaver Primary Care Kevin Weaver: Kevin Weaver Other Clinician: Referring  Kevin Weaver: Treating Kevin Weaver/Extender: Kevin Weaver, Kevin Weaver in Treatment: 3 Vital Signs Height(in): 66 Pulse(bpm): 54 Weight(lbs): 232 Blood Pressure(mmHg): 142/86 Body Mass Index(BMI): 37 Temperature(F): 98.3 Respiratory Rate(breaths/min): 19 Photos: [1:No Photos Right, Lateral Lower Leg] [2:No Photos Right, Lateral, Posterior Lower Leg] [N/A:N/A N/A] Wound Location: [1:Gradually Appeared] [2:Gradually Appeared] [N/A:N/A] Wounding Event: [1:Venous Leg Ulcer] [2:Diabetic Wound/Ulcer of the Lower] [N/A:N/A] Primary Etiology: [1:Hypertension, Type II Diabetes] [2:Extremity Hypertension, Type II Diabetes] [N/A:N/A] Comorbid History: [1:12/27/2019] [2:04/10/2020] [N/A:N/A] Date Acquired: [1:3] [2:2] [N/A:N/A] Weeks of Treatment: [1:Open] [2:Healed - Epithelialized] [N/A:N/A] Wound Status: [1:Yes] [2:No] [N/A:N/A] Clustered Wound: [1:3] [2:N/A] [N/A:N/A] Clustered Quantity: [1:0.5x0.3x0.1] [2:0x0x0] [N/A:N/A] Measurements L x W x D (cm) [1:0.118] [2:0] [N/A:N/A] A (cm) : rea [1:0.012] [2:0] [N/A:N/A] Volume (cm) : [1:99.30%] [2:100.00%] [N/A:N/A] % Reduction in Area: [1:99.20%] [2:100.00%] [N/A:N/A] % Reduction in Volume: [1:Full Thickness Without Exposed] [2:Grade 2]  [N/A:N/A] Classification: [1:Support Structures Medium] [2:None Present] [N/A:N/A] Exudate Amount: [1:Serosanguineous] [2:N/A] [N/A:N/A] Exudate Type: [1:red, brown] [2:N/A] [N/A:N/A] Exudate Color: [1:Flat and Intact] [2:Distinct, outline attached] [N/A:N/A] Wound Margin: [1:Large (67-100%)] [2:None Present (0%)] [N/A:N/A] Granulation Amount: [1:Red, Pink] [2:N/A] [N/A:N/A] Granulation Quality: [1:None Present (0%)] [2:None Present (0%)] [N/A:N/A] Necrotic Amount: [1:Fat Layer (Subcutaneous Tissue)] [2:Fascia: No] [N/A:N/A] Exposed Structures: [1:Exposed: Yes Fascia: No Tendon: No Muscle: No Joint: No Bone: No None] [2:Fat Layer (Subcutaneous Tissue) Exposed: No Tendon: No Muscle: No Joint: No Bone: No Large (67-100%)] [N/A:N/A] Epithelialization: [1:Compression Therapy] [2:N/A] [N/A:N/A] Treatment Notes Electronic Signature(s) Signed: 04/24/2020 5:46:39 PM By: Linton Ham MD Signed: 04/24/2020 6:01:25 PM By: Kevin Weaver Entered By: Linton Ham on 04/24/2020 09:12:41 -------------------------------------------------------------------------------- Multi-Disciplinary Care Plan Details Patient Name: Date of Service: [name], [name] 04/24/2020 8:30 A M Medical Record Number: [id number] Patient Account Number: [id number] Date of Birth/Sex: Treating RN: [birth year] Weaver (79 y.o. Kevin Weaver Primary Care Ashima Shrake: Kevin Weaver Other Clinician: Referring Saliyah Gillin: Treating Karin Pinedo/Extender: Kevin Weaver, Kevin Weaver in Treatment: 3 Active Inactive Wound/Skin Impairment Nursing Diagnoses: Impaired tissue integrity Knowledge deficit related to ulceration/compromised skin integrity Goals: Patient/caregiver will verbalize understanding of skin care regimen Date Initiated: 04/03/2020 Target Resolution Date: 06/02/2020 Goal Status: Active Ulcer/skin breakdown will have a volume reduction of 30% by week 4 Date Initiated: 04/03/2020 Date Inactivated:  04/24/2020 Target Resolution Date: 04/28/2020 Goal Status: Met Interventions: Assess patient/caregiver ability to obtain necessary supplies Assess patient/caregiver ability to perform ulcer/skin care regimen upon admission and as needed Assess ulceration(s) every visit Provide education on ulcer and skin care Notes: Electronic Signature(s) Signed: 04/24/2020 6:01:25 PM By: Kevin Weaver Entered By: Kevin Weaver on 04/24/2020 11:53:56 -------------------------------------------------------------------------------- Pain Assessment Details Patient Name: Date of Service: [name], [name] 04/24/2020 8:30 A M Medical Record Number: [id number] Patient Account Number: [id number] Date of Birth/Sex: Treating RN: [birth year] Weaver (79 y.o. Kevin Weaver Primary Care Lamarion Mcevers: Kevin Weaver Other Clinician: Referring Lyrick Lagrand: Treating Kiele Heavrin/Extender: Kevin Weaver in Treatment: 3 Active Problems Location of Pain Severity and Description of Pain Patient Has Paino No Site Locations Pain Management and Medication Current Pain Management: Electronic Signature(s) Signed: 04/24/2020 5:58:35 PM By: Kevin Weaver Entered By: Kevin Weaver on 04/24/2020 08:41:21 -------------------------------------------------------------------------------- Patient/Caregiver Education Details Patient Name: Date of Service: [name], [name] 6/28/2021andnbsp8:30 A M Medical Record Number: [id number] Patient Account Number: [id number] Date of Birth/Gender: Treating RN: [birth year] (79 y.o. Kevin Weaver Primary Care Physician: Kevin Weaver Other Clinician: Referring Physician: Treating Physician/Extender: Kevin Weaver in Treatment: 3 Education Assessment  Education Provided To: Patient Education Topics Provided Wound/Skin Impairment: Methods: Explain/Verbal Responses: State content correctly Electronic Signature(s) Signed:  04/24/2020 6:01:25 PM By: Kevin Weaver Entered By: Kevin Weaver on 04/24/2020 11:54:14 -------------------------------------------------------------------------------- Wound Assessment Details Patient Name: Date of Service: [name], [name] 04/24/2020 8:30 A M Medical Record Number: [id number] Patient Account Number: [id number] Date of Birth/Sex: Treating RN: [birth year] (79 y.o. Kevin Weaver Primary Care Elisavet Buehrer: Kevin Weaver Other Clinician: Referring Trinika Cortese: Treating Jameya Pontiff/Extender: Kevin Weaver in Treatment: 3 Wound Status Wound Number: 1 Primary Etiology: Venous Leg Ulcer Wound Location: Right, Lateral Lower Leg Wound Status: Open Wounding Event: Gradually Appeared Comorbid History: Hypertension, Type II Diabetes Date Acquired: 12/27/2019 Weeks Of Treatment: 3 Clustered Wound: Yes Wound Measurements Length: (cm) 0.5 Width: (cm) 0.3 Depth: (cm) 0.1 Clustered Quantity: 3 Area: (cm) 0.118 Volume: (cm) 0.012 % Reduction in Area: 99.3% % Reduction in Volume: 99.2% Epithelialization: None Tunneling: No Undermining: No Wound Description Classification: Full Thickness Without Exposed Support Structures Wound Margin: Flat and Intact Exudate Amount: Medium Exudate Type: Serosanguineous Exudate Color: red, brown Foul Odor After Cleansing: No Slough/Fibrino No Wound Bed Granulation Amount: Large (67-100%) Exposed Structure Granulation Quality: Red, Pink Fascia Exposed: No Necrotic Amount: None Present (0%) Fat Layer (Subcutaneous Tissue) Exposed: Yes Tendon Exposed: No Muscle Exposed: No Joint Exposed: No Bone Exposed: No Treatment Notes Wound #1 (Right, Lateral Lower Leg) 2. Periwound Care Moisturizing lotion TCA Cream 3. Primary Dressing Applied Calcium Alginate Ag 4. Secondary Dressing Dry Gauze 6. Support Layer Applied 3 layer compression wrap Notes netting. Electronic Signature(s) Signed: 04/24/2020  5:58:35 PM By: Kevin Weaver Entered By: Kevin Weaver on 04/24/2020 08:49:31 -------------------------------------------------------------------------------- Wound Assessment Details Patient Name: Date of Service: [name], [name] 04/24/2020 8:30 A M Medical Record Number: [id number] Patient Account Number: [id number] Date of Birth/Sex: Treating RN: [birth year] (79 y.o. Kevin Weaver Primary Care Kacper Cartlidge: Kevin Weaver Other Clinician: Referring Eydan Chianese: Treating Ottis Sarnowski/Extender: Kevin Weaver in Treatment: 3 Wound Status Wound Number: 2 Primary Etiology: Diabetic Wound/Ulcer of the Lower Extremity Wound Location: Right, Lateral, Posterior Lower Leg Wound Status: Healed - Epithelialized Wounding Event: Gradually Appeared Comorbid History: Hypertension, Type II Diabetes Date Acquired: 04/10/2020 Weeks Of Treatment: 2 Clustered Wound: No Wound Measurements Length: (cm) Width: (cm) Depth: (cm) Area: (cm) Volume: (cm) 0 % Reduction in Area: 100% 0 % Reduction in Volume: 100% 0 Epithelialization: Large (67-100%) 0 Tunneling: No 0 Undermining: No Wound Description Classification: Grade 2 Wound Margin: Distinct, outline attached Exudate Amount: None Present Foul Odor After Cleansing: No Slough/Fibrino No Wound Bed Granulation Amount: None Present (0%) Exposed Structure Necrotic Amount: None Present (0%) Fascia Exposed: No Fat Layer (Subcutaneous Tissue) Exposed: No Tendon Exposed: No Muscle Exposed: No Joint Exposed: No Bone Exposed: No Electronic Signature(s) Signed: 04/24/2020 5:58:35 PM By: Kevin Weaver Entered By: Kevin Weaver on 04/24/2020 08:49:53 -------------------------------------------------------------------------------- Vitals Details Patient Name: Date of Service: [name], [name] 04/24/2020 8:30 A M Medical Record Number: [id number] Patient Account Number: [id number] Date of Birth/Sex: Treating  RN: [birth year] (79 y.o. Kevin Weaver Primary Care Gunnard Dorrance: Kevin Weaver Other Clinician: Referring Clela Hagadorn: Treating Badr Piedra/Extender: Kevin Weaver in Treatment: 3 Vital Signs Time Taken: 08:40 Temperature (F): 98.3 Height (in): 66 Pulse (bpm): 54 Weight (lbs): 232 Respiratory Rate (breaths/min): 19 Body Mass Index (BMI): 37.4 Blood Pressure (mmHg): 142/86 Reference Range: 80 - 120 mg / dl Electronic Signature(s) Signed: 04/24/2020 5:58:35 PM By: Verita Schneiders,  Larene Beach Entered By: Kevin Weaver on 04/24/2020 08:41:14

## 2020-04-29 ENCOUNTER — Other Ambulatory Visit: Payer: Self-pay | Admitting: Family Medicine

## 2020-04-29 NOTE — Telephone Encounter (Signed)
Requested Prescriptions  Pending Prescriptions Disp Refills  . omeprazole (PRILOSEC) 20 MG capsule [Pharmacy Med Name: OMEPRAZOLE DR 20 MG CAPSULE] 90 capsule 1    Sig: TAKE 1 CAPSULE BY MOUTH EVERY DAY     Gastroenterology: Proton Pump Inhibitors Passed - 04/29/2020  9:27 AM      Passed - Valid encounter within last 12 months    Recent Outpatient Visits          1 month ago Stasis ulcer of lower extremity, right (HCC)   [facility], Meda Coffee, MD   1 month ago Essential hypertension   [facility], Meda Coffee, MD   2 months ago Medicare annual wellness visit, subsequent   Primary Care at [facility], [place] A, MD   7 months ago Type 2 diabetes mellitus with other circulatory complication, without long-term current use of insulin [facility])   [facility], Meda Coffee, MD   10 months ago Type 2 diabetes mellitus with other specified complication, unspecified whether long term insulin use [facility])   [facility], Manus Rudd, MD      Future Appointments            In 1 month Leretha Pol, Meda Coffee, MD [facility] at [place]

## 2020-05-02 ENCOUNTER — Encounter (HOSPITAL_BASED_OUTPATIENT_CLINIC_OR_DEPARTMENT_OTHER): Payer: Medicare Other | Attending: Internal Medicine | Admitting: Internal Medicine

## 2020-05-02 DIAGNOSIS — E1151 Type 2 diabetes mellitus with diabetic peripheral angiopathy without gangrene: Secondary | ICD-10-CM | POA: Diagnosis not present

## 2020-05-02 DIAGNOSIS — Z09 Encounter for follow-up examination after completed treatment for conditions other than malignant neoplasm: Secondary | ICD-10-CM | POA: Diagnosis not present

## 2020-05-02 DIAGNOSIS — I10 Essential (primary) hypertension: Secondary | ICD-10-CM | POA: Insufficient documentation

## 2020-05-02 DIAGNOSIS — Z872 Personal history of diseases of the skin and subcutaneous tissue: Secondary | ICD-10-CM | POA: Diagnosis not present

## 2020-05-02 NOTE — Progress Notes (Signed)
Kevin, Weaver ([id number]) Visit Report for 05/02/2020 HPI Details Patient Name: Date of Service: [name], [name] 05/02/2020 8:00 A M Medical Record Number: [id number] Patient Account Number: [id number] Date of Birth/Sex: Treating RN: [birth year] (79 y.o. Kevin Weaver Primary Care Provider: Koren Shiver Other Clinician: Referring Provider: Treating Provider/Extender: Melynda Ripple in Treatment: 4 History of Present Illness HPI Description: ADMISSION 04/03/20 This is a 79 year old man who states he has been dealing with blistering on his lower legs since Thanksgiving of last year and remaining wounds on the right lateral lower leg for the last several months. He says he has had a lot of pain to uncomfortable. He was seen by his primary doctor on 5/25 diagnosed with cellulitis and an ulcer on his right ankle who was given Keflex and some ointment but the patient does not remember what that was. He was felt to have a stasis ulcer. The patient had arterial studies done in July 2020. These showed an ABI of 1.07 on the right but a TBI of only 0.38 with monophasic waveforms. On the left he had an ABI of 1.11 with a TBI of 0.67 and triphasic waveforms. Past medical history includes type 2 diabetes, hypertension, hyperlipidemia, leg swelling, PAD, right foot trauma at age 71 where his foot was caught in a lawn more. Left him with a permanent inversion at the right ankle. He had right arm trauma catching his hand in a piece of farm equipment and lost all of his fingers except his thumb and part of his second finger. 04/10/2020 upon evaluation today patient appears to be doing well upon evaluation today in regard to his wounds on his leg. We have actually divided those in the 2 because they were doing as well as they are. Fortunately there is no signs of active infection at this time which is great news. I do believe the compression wrap is also been of benefit. 6/21;  patient has 2 small open areas on the right lateral lower leg one laterally and one more posterior. Neither one of them look like they required debridement we've been using silver alginate under 3 alert compression. he still has not heard anything from vascular waiver repeating his arterial studies as well as venous reflux studies on the right leg 6/28; patient's wound on the right posterior is closed he still has a very superficial tiny area on the right lateral calf. This does not look ominous. This is in the setting of chronic venous information with some degree of stasis dermatitis and hemosiderin deposition. He tells me that he has stockings at home he is going to bring those in next week. We will give him his measurements and the contact information for elastic therapy in [place]. Finally he has appointments for venous reflux studies and arterial studies on 7/[facility] at [facility]. I suspect he will be discharged from here by then 7/6; the patient's wounds on the right posterior and right lateral lower leg are closed. He did not bring his stocking in today although he has 1 on the left. He says he is also going to get new stockings from elastic therapy in [place]. Finally he has his venous reflux and arterial studies on 7/23. We will look at them and direct him to vein and vascular if it looks like he would benefit from a consultation Electronic Signature(s) Signed: 05/02/2020 5:09:59 PM By: Baltazar Najjar MD Entered By: Baltazar Najjar on 05/02/2020 08:43:28 -------------------------------------------------------------------------------- Physical Exam Details  Patient Name: Date of Service: [name], [name] 05/02/2020 8:00 A M Medical Record Number: [id number] Patient Account Number: [id number] Date of Birth/Sex: Treating RN: June 25, 1941 (79 y.o. Kevin Weaver Primary Care Provider: Koren Shiver Other Clinician: Referring Provider: Treating Provider/Extender: Melynda Ripple in Treatment: 4 Constitutional Patient is hypertensive.. Pulse regular and within target range for patient.Kevin Weaver Respirations regular, non-labored and within target range.. Temperature is normal and within the target range for the patient.Kevin Weaver Appears in no distress. Cardiovascular It will pulses are faint right dorsalis pedis and posterior tibia. Good edema control. Changes consistent with stasis dermatitis/venous insufficiency. Musculoskeletal Ankle joint inverts although this has nothing to do with current wounds. Integumentary (Hair, Skin) Hemosiderin deposition.. Notes Wound exam; venous insufficiency wounds with surrounding stasis dermatitis hemosiderin deposition. His wounds on the right lateral which was the remaining wound last time has closed right posterior closed previously. Electronic Signature(s) Signed: 05/02/2020 5:09:59 PM By: Baltazar Najjar MD Entered By: Baltazar Najjar on 05/02/2020 08:45:04 -------------------------------------------------------------------------------- Physician Orders Details Patient Name: Date of Service: [name], [name] 05/02/2020 8:00 A M Medical Record Number: [id number] Patient Account Number: [id number] Date of Birth/Sex: Treating RN: 11/05/1940 (79 y.o. Kevin Weaver Primary Care Provider: Koren Shiver Other Clinician: Referring Provider: Treating Provider/Extender: Melynda Ripple in Treatment: 4 Verbal / Phone Orders: No Diagnosis Coding ICD-10 Coding Code Description I87.331 Chronic venous hypertension (idiopathic) with ulcer and inflammation of right lower extremity 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sed E11.622 Type 2 diabetes mellitus with other skin ulcer E11.51 Type 2 diabetes mellitus with diabetic peripheral angiopathy without gangrene Discharge From [facility] Services Discharge from Wound Care Center Skin  Barriers/Peri-Wound Care Moisturizing lotion - both legs daily Edema Control Avoid standing for long periods of time Elevate legs to the level of the heart or above for 30 minutes daily and/or when sitting, a frequency of: - throughout the day Support Garment 20-30 mm/Hg pressure to: - compression stockings to both legs daily. Apply first thing in the morning, remove at night before bedtime. Electronic Signature(s) Signed: 05/02/2020 5:09:59 PM By: Baltazar Najjar MD Signed: 05/02/2020 5:26:11 PM By: Zandra Abts RN, BSN Entered By: Zandra Abts on 05/02/2020 08:41:52 -------------------------------------------------------------------------------- Problem List Details Patient Name: Date of Service: [name], [name] 05/02/2020 8:00 A M Medical Record Number: [id number] Patient Account Number: [id number] Date of Birth/Sex: Treating RN: July 19, 1941 (79 y.o. Kevin Weaver Primary Care Provider: Other Clinician: Koren Shiver Referring Provider: Treating Provider/Extender: Melynda Ripple in Treatment: 4 Active Problems ICD-10 Encounter Code Description Active Date MDM Diagnosis I87.331 Chronic venous hypertension (idiopathic) with ulcer and inflammation of right 04/03/2020 No Yes lower extremity L97.812 Non-pressure chronic ulcer of other part of right lower leg with fat layer 04/03/2020 No Yes exposed E11.622 Type 2 diabetes mellitus with other skin ulcer 04/03/2020 No Yes E11.51 Type 2 diabetes mellitus with diabetic peripheral angiopathy without gangrene 04/03/2020 No Yes Inactive Problems Resolved Problems Electronic Signature(s) Signed: 05/02/2020 5:09:59 PM By: Baltazar Najjar MD Entered By: Baltazar Najjar on 05/02/2020 08:42:23 -------------------------------------------------------------------------------- Progress Note Details Patient Name: Date of Service: [name], [name] 05/02/2020 8:00 A M Medical Record Number: [id number] Patient Account Number:  [id number] Date of Birth/Sex: Treating RN: 27-Feb-1941 (79 y.o. Kevin Weaver Primary Care Provider: Koren Shiver Other Clinician: Referring Provider: Treating Provider/Extender: Gerome Apley, Ruben Reason in Treatment: 4 Subjective  History of Present Illness (HPI) ADMISSION 04/03/20 This is a 79 year old man who states he has been dealing with blistering on his lower legs since Thanksgiving of last year and remaining wounds on the right lateral lower leg for the last several months. He says he has had a lot of pain to uncomfortable. He was seen by his primary doctor on 5/25 diagnosed with cellulitis and an ulcer on his right ankle who was given Keflex and some ointment but the patient does not remember what that was. He was felt to have a stasis ulcer. The patient had arterial studies done in July 2020. These showed an ABI of 1.07 on the right but a TBI of only 0.38 with monophasic waveforms. On the left he had an ABI of 1.11 with a TBI of 0.67 and triphasic waveforms. Past medical history includes type 2 diabetes, hypertension, hyperlipidemia, leg swelling, PAD, right foot trauma at age 79 where his foot was caught in a lawn more. Left him with a permanent inversion at the right ankle. He had right arm trauma catching his hand in a piece of farm equipment and lost all of his fingers except his thumb and part of his second finger. 04/10/2020 upon evaluation today patient appears to be doing well upon evaluation today in regard to his wounds on his leg. We have actually divided those in the 2 because they were doing as well as they are. Fortunately there is no signs of active infection at this time which is great news. I do believe the compression wrap is also been of benefit. 6/21; patient has 2 small open areas on the right lateral lower leg one laterally and one more posterior. Neither one of them look like they required debridement we've been using silver alginate under 3  alert compression. he still has not heard anything from vascular waiver repeating his arterial studies as well as venous reflux studies on the right leg 6/28; patient's wound on the right posterior is closed he still has a very superficial tiny area on the right lateral calf. This does not look ominous. This is in the setting of chronic venous information with some degree of stasis dermatitis and hemosiderin deposition. He tells me that he has stockings at home he is going to bring those in next week. We will give him his measurements and the contact information for elastic therapy in [place]. Finally he has appointments for venous reflux studies and arterial studies on 7/23 at vein and vascular. I suspect he will be discharged from here by then 7/6; the patient's wounds on the right posterior and right lateral lower leg are closed. He did not bring his stocking in today although he has 1 on the left. He says he is also going to get new stockings from elastic therapy in [place]. Finally he has his venous reflux and arterial studies on 7/23. We will look at them and direct him to vein and vascular if it looks like he would benefit from a consultation Objective Constitutional Patient is hypertensive.. Pulse regular and within target range for patient.Kevin Weaver Respirations regular, non-labored and within target range.. Temperature is normal and within the target range for the patient.Kevin Weaver Appears in no distress. Vitals Time Taken: 7:55 AM, Height: 66 in, Weight: 232 lbs, BMI: 37.4, Temperature: 97.8 F, Pulse: 69 bpm, Respiratory Rate: 19 breaths/min, Blood Pressure: 152/89 mmHg. Cardiovascular It will pulses are faint right dorsalis pedis and posterior tibia. Good edema control. Changes consistent with stasis dermatitis/venous insufficiency. Musculoskeletal Ankle  joint inverts although this has nothing to do with current wounds. General Notes: Wound exam; venous insufficiency wounds with surrounding  stasis dermatitis hemosiderin deposition. His wounds on the right lateral which was the remaining wound last time has closed right posterior closed previously. Integumentary (Hair, Skin) Hemosiderin deposition.. Wound #1 status is Healed - Epithelialized. Original cause of wound was Gradually Appeared. The wound is located on the Right,Lateral Lower Leg. The wound measures 0cm length x 0cm width x 0cm depth; 0cm^2 area and 0cm^3 volume. There is no tunneling or undermining noted. There is a none present amount of drainage noted. The wound margin is distinct with the outline attached to the wound base. There is no granulation within the wound bed. There is no necrotic tissue within the wound bed. 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 Apply first thing in the morning, remove at night before bedtime. 1. We discharged him to his own 20/30 compression stockings 2. He had a stocking on the left leg so I am confident about this 3. I will look at the studies from 7/23 which should include both venous reflux and arterial evaluations. Rectum for a consultation if that seems clinically indicated. 4. He also needs new stockings and he is aware of this. We have given him his measurements for elastic therapy in  [place] Electronic Signature(s) Signed: 05/02/2020 5:09:59 PM By: Baltazar Najjarobson, Malikiah Debarr MD Entered By: Baltazar Najjarobson, Nyssa Sayegh on 05/02/2020 08:46:03 -------------------------------------------------------------------------------- SuperBill Details Patient Name: Date of Service: [name], [name] 05/02/2020 Medical Record Number: [id number] Patient Account Number: [id number] Date of Birth/Sex: Treating RN: [birth year] (79 y.o. Kevin KollerM) Lynch, Shatara Primary Care Provider: Koren ShiverSantiago, Irma Other Clinician: Referring Provider: Treating Provider/Extender: Melynda Rippleobson, Yifan Auker Santiago, Irma Weeks in Treatment: 4 Diagnosis Coding ICD-10 Codes Code Description [phone] Chronic venous hypertension (idiopathic) with ulcer and inflammation of right lower extremity 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sed E11.622 Type 2 diabetes mellitus with other skin ulcer E11.51 Type 2 diabetes mellitus with diabetic peripheral angiopathy without gangrene Facility Procedures CPT4 Code: [id number] Description: 99213 - WOUND CARE VISIT-LEV 3 EST PT Modifier: Quantity: 1 Physician Procedures : CPT4 Code Description Modifier [id number] 99213 - WC PHYS LEVEL 3 - EST PT ICD-10 Diagnosis Description I87.331 Chronic venous hypertension (idiopathic) with ulcer and inflammation of right lower extremity L97.812 Non-pressure chronic ulcer of other part  of right lower leg with fat layer exposed Quantity: 1 Electronic Signature(s) Signed: 05/02/2020 5:09:59 PM By: Baltazar Najjarobson, Krystel Fletchall MD Signed: 05/02/2020 5:26:11 PM By: Zandra AbtsLynch, Shatara RN, BSN Entered By: Zandra AbtsLynch, Shatara on 05/02/2020 10:29:28

## 2020-05-10 NOTE — Progress Notes (Signed)
Kevin Weaver, Kevin L. ([id number]) Visit Report for 05/02/2020 Arrival Information Details Patient Name: Date of Service: Kevin DA MS, [name] 05/02/2020 8:00 Kevin M Medical Record Number: [id number] Patient Account Number: [id number] Date of Birth/Sex: Treating RN: 03-11-1941 (79 y.o. Kevin Weaver) Kevin Weaver, Kevin Weaver Primary Care Kevin Weaver: Kevin Weaver, Kevin Weaver Other Clinician: Referring Kevin Weaver: Treating Kevin Weaver/Extender: Kevin Weaver, Kevin Weaver, Kevin Weaver Weeks in Treatment: 4 Visit Information History Since Last Visit Added or deleted any medications: No Patient Arrived: Ambulatory Any new allergies or adverse reactions: No Arrival Time: 07:54 Had Kevin fall or experienced change in No Accompanied By: self activities of daily living that may affect Transfer Assistance: None risk of falls: Patient Identification Verified: Yes Signs or symptoms of abuse/neglect since last visito No Secondary Verification Process Completed: Yes Hospitalized since last visit: No Patient Requires Transmission-Based Precautions: No Implantable device outside of the clinic excluding No Patient Has Alerts: Yes cellular tissue based products placed in the center Patient Alerts: R ABI: 1.07 TBI: 0.38 since last visit: L ABI:1.11 TBI: 0.65 Has Dressing in Place as Prescribed: Yes July 2020 Pain Present Now: No Electronic Signature(s) Signed: 05/05/2020 2:13:12 PM By: Kevin Weaver, Kevin Weaver Entered By: Kevin Weaver, Kevin Weaver on 05/02/2020 07:55:37 -------------------------------------------------------------------------------- Clinic Level of Care Assessment Details Patient Name: Date of Service: Kevin DA MS, [name] 05/02/2020 8:00 Kevin M Medical Record Number: [id number] Patient Account Number: [id number] Date of Birth/Sex: Treating RN: 03-11-1941 (79 y.o. Kevin Weaver) Kevin Weaver, Kevin Weaver Primary Care Kevin Weaver: Kevin Weaver, Kevin Weaver Other Clinician: Referring Kevin Weaver: Treating Kevin Weaver: Kevin Weaver, Kevin Weaver, Kevin Weaver Weeks in Treatment: 4 Clinic Level of Care  Assessment Items TOOL 4 Quantity Score X- 1 0 Use when only an EandM is performed on FOLLOW-UP visit ASSESSMENTS - Nursing Assessment / Reassessment X- 1 10 Reassessment of Co-morbidities (includes updates in patient status) X- 1 5 Reassessment of Adherence to Treatment Plan ASSESSMENTS - Wound and Skin Kevin ssessment / Reassessment X - Simple Wound Assessment / Reassessment - one wound 1 5 []  - 0 Complex Wound Assessment / Reassessment - multiple wounds []  - 0 Dermatologic / Skin Assessment (not related to wound area) ASSESSMENTS - Focused Assessment []  - 0 Circumferential Edema Measurements - multi extremities []  - 0 Nutritional Assessment / Counseling / Intervention X- 1 5 Lower Extremity Assessment (monofilament, tuning fork, pulses) []  - 0 Peripheral Arterial Disease Assessment (using hand held doppler) ASSESSMENTS - Ostomy and/or Continence Assessment and Care []  - 0 Incontinence Assessment and Management []  - 0 Ostomy Care Assessment and Management (repouching, etc.) PROCESS - Coordination of Care X - Simple Patient / Family Education for ongoing care 1 15 []  - 0 Complex (extensive) Patient / Family Education for ongoing care X- 1 10 Staff obtains ChiropractorConsents, Records, T Results / Process Orders est []  - 0 Staff telephones HHA, Nursing Homes / Clarify orders / etc []  - 0 Routine Transfer to another Facility (non-emergent condition) []  - 0 Routine Hospital Admission (non-emergent condition) []  - 0 New Admissions / Manufacturing engineernsurance Authorizations / Ordering NPWT Apligraf, etc. , []  - 0 Emergency Hospital Admission (emergent condition) X- 1 10 Simple Discharge Coordination []  - 0 Complex (extensive) Discharge Coordination PROCESS - Special Needs []  - 0 Pediatric / Minor Patient Management []  - 0 Isolation Patient Management []  - 0 Hearing / Language / Visual special needs []  - 0 Assessment of Community assistance (transportation, D/C planning, etc.) []  -  0 Additional assistance / Altered mentation []  - 0 Support Surface(s) Assessment (bed, cushion, seat, etc.) INTERVENTIONS - Wound Cleansing / Measurement X -  Simple Wound Cleansing - one wound 1 5 []  - 0 Complex Wound Cleansing - multiple wounds X- 1 5 Wound Imaging (photographs - any number of wounds) []  - 0 Wound Tracing (instead of photographs) X- 1 5 Simple Wound Measurement - one wound []  - 0 Complex Wound Measurement - multiple wounds INTERVENTIONS - Wound Dressings []  - 0 Small Wound Dressing one or multiple wounds []  - 0 Medium Wound Dressing one or multiple wounds []  - 0 Large Wound Dressing one or multiple wounds []  - 0 Application of Medications - topical []  - 0 Application of Medications - injection INTERVENTIONS - Miscellaneous []  - 0 External ear exam []  - 0 Specimen Collection (cultures, biopsies, blood, body fluids, etc.) []  - 0 Specimen(s) / Culture(s) sent or taken to Lab for analysis []  - 0 Patient Transfer (multiple staff / / Similar devices) []  - 0 Simple Staple / Suture removal (25 or less) []  - 0 Complex Staple / Suture removal (26 or more) []  - 0 Hypo / Hyperglycemic Management (close monitor of Blood Glucose) []  - 0 Ankle / Brachial Index (ABI) - do not check if billed separately X- 1 5 Vital Signs Has the patient been seen at the hospital within the last three years: Yes Total Score: 80 Level Of Care: New/Established - Level 3 Electronic Signature(s) Signed: 05/02/2020 5:26:11 PM By: RN, BSN Entered By: on 05/02/2020 10:29:14 -------------------------------------------------------------------------------- Encounter Discharge Information Details Patient Name: Date of Service: Kevin DA MS, [name] 05/02/2020 8:00 Kevin M Medical Record Number: Patient Account Number: Date of Birth/Sex: Treating RN: 1941-07-09 (79 y.o. Primary Care Kima Malenfant: Other  Clinician: Referring Blythe Veach: Treating Belal Scallon/Extender: Nurse, adult in Treatment: 4 Encounter Discharge Information Items Discharge Condition: Stable Ambulatory Status: Ambulatory Discharge Destination: Home Transportation: Private Auto Accompanied By: alone Schedule Follow-up Appointment: Yes Clinical Summary of Care: Patient Declined Electronic Signature(s) Signed: 05/02/2020 5:26:11 PM By: RN, BSN Entered By: on 05/02/2020 10:29:47 -------------------------------------------------------------------------------- Lower Extremity Assessment Details Patient Name: Date of Service: Kevin DA MS, [name] 05/02/2020 8:00 Kevin M Medical Record Number: Kevin Weaver Patient Account Number: Kevin Weaver Date of Birth/Sex: Treating RN: Aug 09, 1941 (79 y.o. Kevin Weaver Primary Care Nyilah Kight: [id number] Other Clinician: Referring Cailyn Houdek: Treating Samad Thon/Extender: 08/12/1941 in Treatment: 4 Edema Assessment Assessed: 70: No] [Weaver: No] Edema: [Left: Ye] [Weaver: s] Calf Left: Weaver: Point of Measurement: 42 cm From Medial Instep cm 36.5 cm Ankle Left: Weaver: Point of Measurement: 11 cm From Medial Instep cm 24 cm Vascular Assessment Pulses: Dorsalis Pedis Palpable: [Weaver:Yes] Electronic Signature(s) Signed: 05/02/2020 5:18:40 PM By: Kevin Weaver Entered By: Kevin Ripple on 05/02/2020 07:59:51 -------------------------------------------------------------------------------- Multi Wound Chart Details Patient Name: Date of Service: Kevin DA MS, [name] 05/02/2020 8:00 Kevin M Medical Record Number: Kevin Weaver Patient Account Number: 07/03/2020 Date of Birth/Sex: Treating RN: Jan 19, 1941 (79 y.o. [id number] Primary Care Jennice Renegar: 08/12/1941 Other Clinician: Referring Aalyiah Camberos: Treating Coy Rochford/Extender: 70, Kevin Weaver in Treatment: 4 Vital  Signs Height(in): 66 Pulse(bpm): 69 Weight(lbs): 232 Blood Pressure(mmHg): 152/89 Body Mass Index(BMI): 37 Temperature(F): 97.8 Respiratory Rate(breaths/min): 19 Photos: [1:No Photos Weaver, Lateral Lower Leg] [N/Kevin:N/Kevin N/Kevin] Wound Location: [1:Gradually Appeared] [N/Kevin:N/Kevin] Wounding Event: [1:Venous Leg Ulcer] [N/Kevin:N/Kevin] Primary Etiology: [1:Hypertension, Type II Diabetes] [N/Kevin:N/Kevin] Comorbid History: [1:12/27/2019] [N/Kevin:N/Kevin] Date Acquired: [1:4] [N/Kevin:N/Kevin] Weeks of Treatment: [1:Healed - Epithelialized] [N/Kevin:N/Kevin] Wound Status: [1:Yes] [N/Kevin:N/Kevin] Clustered Wound: [  1:0] [N/Kevin:N/Kevin] Clustered Quantity: [1:0x0x0] [N/Kevin:N/Kevin] Measurements L x W x D (cm) [1:0] [N/Kevin:N/Kevin] Kevin (cm) : rea [1:0] [N/Kevin:N/Kevin] Volume (cm) : [1:100.00%] [N/Kevin:N/Kevin] % Reduction in Kevin rea: [1:100.00%] [N/Kevin:N/Kevin] % Reduction in Volume: [1:Full Thickness Without Exposed] [N/Kevin:N/Kevin] Classification: [1:Support Structures None Present] [N/Kevin:N/Kevin] Exudate Amount: [1:Distinct, outline attached] [N/Kevin:N/Kevin] Wound Margin: [1:None Present (0%)] [N/Kevin:N/Kevin] Granulation Amount: [1:None Present (0%)] [N/Kevin:N/Kevin] Necrotic Amount: [1:Fascia: No] [N/Kevin:N/Kevin] Exposed Structures: [1:Fat Layer (Subcutaneous Tissue) Exposed: No Tendon: No Muscle: No Joint: No Bone: No Large (67-100%)] [N/Kevin:N/Kevin] Treatment Notes Electronic Signature(s) Signed: 05/02/2020 5:09:59 PM By: Baltazar Najjar MD Signed: 05/02/2020 5:26:11 PM By: Kevin Abts RN, BSN Entered By: Baltazar Najjar on 05/02/2020 08:42:29 -------------------------------------------------------------------------------- Multi-Disciplinary Care Plan Details Patient Name: Date of Service: Kevin DA MS, [name] 05/02/2020 8:00 Kevin M Medical Record Number: [id number] Patient Account Number: [id number] Date of Birth/Sex: Treating RN: Mar 01, 1941 (79 y.o. Kevin Weaver Primary Care Kirill Chatterjee: Kevin Weaver Other Clinician: Referring Isair Inabinet: Treating Shondrea Steinert/Extender: Kevin Ripple  in Treatment: 4 Active Inactive Electronic Signature(s) Signed: 05/02/2020 5:26:11 PM By: Kevin Abts RN, BSN Entered By: Kevin Weaver on 05/02/2020 10:28:56 -------------------------------------------------------------------------------- Pain Assessment Details Patient Name: Date of Service: Kevin DA MS, [name] 05/02/2020 8:00 Kevin M Medical Record Number: [id number] Patient Account Number: [id number] Date of Birth/Sex: Treating RN: 17-Jan-1941 (79 y.o. Kevin Weaver Primary Care Wally Behan: Kevin Weaver Other Clinician: Referring Murry Khiev: Treating Alden Feagan/Extender: Kevin Ripple in Treatment: 4 Active Problems Location of Pain Severity and Description of Pain Patient Has Paino No Site Locations Pain Management and Medication Current Pain Management: Electronic Signature(s) Signed: 05/02/2020 5:26:11 PM By: Kevin Abts RN, BSN Signed: 05/05/2020 2:13:12 PM By: Kevin Ito Entered By: Kevin Ito on 05/02/2020 07:55:56 -------------------------------------------------------------------------------- Patient/Caregiver Education Details Patient Name: Date of Service: Kevin DA MS, [name] 7/6/2021andnbsp8:00 Kevin M Medical Record Number: [id number] Patient Account Number: [id number] Date of Birth/Gender: Treating RN: 05-May-1941 (79 y.o. Kevin Weaver Primary Care Physician: Kevin Weaver Other Clinician: Referring Physician: Treating Physician/Extender: Kevin Ripple in Treatment: 4 Education Assessment Education Provided To: Patient Education Topics Provided Wound/Skin Impairment: Methods: Explain/Verbal Responses: State content correctly Electronic Signature(s) Signed: 05/02/2020 5:26:11 PM By: Kevin Abts RN, BSN Entered By: Kevin Weaver on 05/02/2020 08:04:22 -------------------------------------------------------------------------------- Wound Assessment Details Patient Name: Date of Service: Kevin DA  MS, [name] 05/02/2020 8:00 Kevin M Medical Record Number: [id number] Patient Account Number: [id number] Date of Birth/Sex: Treating RN: Nov 19, 1940 (79 y.o. Kevin Weaver Primary Care Samanthan Dugo: Kevin Weaver Other Clinician: Referring Journi Moffa: Treating Savino Whisenant/Extender: Kevin Ripple in Treatment: 4 Wound Status Wound Number: 1 Primary Etiology: Venous Leg Ulcer Wound Location: Weaver, Lateral Lower Leg Wound Status: Healed - Epithelialized Wounding Event: Gradually Appeared Comorbid History: Hypertension, Type II Diabetes Date Acquired: 12/27/2019 Weeks Of Treatment: 4 Clustered Wound: Yes Wound Measurements Length: (cm) Width: (cm) Depth: (cm) Clustered Quantity: Area: (cm) Volume: (cm) 0 % Reduction in Area: 100% 0 % Reduction in Volume: 100% 0 Epithelialization: Large (67-100%) 0 Tunneling: No 0 Undermining: No 0 Wound Description Classification: Full Thickness Without Exposed Support Structures Wound Margin: Distinct, outline attached Exudate Amount: None Present Foul Odor After Cleansing: No Slough/Fibrino No Wound Bed Granulation Amount: None Present (0%) Exposed Structure Necrotic Amount: None Present (0%) Fascia Exposed: No Fat Layer (Subcutaneous Tissue) Exposed: No Tendon Exposed: No Muscle Exposed: No Joint Exposed: No Bone Exposed: No Electronic Signature(s) Signed: 05/02/2020 5:18:40 PM By: Cherylin Mylar Signed: 05/02/2020  5:26:11 PM By: Kevin Abts RN, BSN Entered By: Kevin Weaver on 05/02/2020 08:40:15 -------------------------------------------------------------------------------- Vitals Details Patient Name: Date of Service: Kevin DA MS, [name] 05/02/2020 8:00 Kevin M Medical Record Number: [id number] Patient Account Number: [id number] Date of Birth/Sex: Treating RN: 07/28/1941 (79 y.o. Kevin Weaver Primary Care Diamante Truszkowski: Kevin Weaver Other Clinician: Referring Danie Hannig: Treating Deajah Erkkila/Extender:  Kevin Ripple in Treatment: 4 Vital Signs Time Taken: 07:55 Temperature (F): 97.8 Height (in): 66 Pulse (bpm): 69 Weight (lbs): 232 Respiratory Rate (breaths/min): 19 Body Mass Index (BMI): 37.4 Blood Pressure (mmHg): 152/89 Reference Range: 80 - 120 mg / dl Electronic Signature(s) Signed: 05/05/2020 2:13:12 PM By: Kevin Ito Entered By: Kevin Ito on 05/02/2020 07:55:51

## 2020-05-16 ENCOUNTER — Other Ambulatory Visit (HOSPITAL_COMMUNITY): Payer: Self-pay | Admitting: Internal Medicine

## 2020-05-16 DIAGNOSIS — R600 Localized edema: Secondary | ICD-10-CM

## 2020-05-18 ENCOUNTER — Other Ambulatory Visit: Payer: Self-pay

## 2020-05-18 ENCOUNTER — Ambulatory Visit (HOSPITAL_COMMUNITY)
Admission: RE | Admit: 2020-05-18 | Discharge: 2020-05-18 | Disposition: A | Discharge status: Home / Self Care | Payer: Medicare Other | Source: Ambulatory Visit | Attending: Vascular Surgery | Admitting: Vascular Surgery

## 2020-05-18 DIAGNOSIS — R600 Localized edema: Secondary | ICD-10-CM | POA: Diagnosis not present

## 2020-05-19 ENCOUNTER — Ambulatory Visit (HOSPITAL_COMMUNITY)
Admission: RE | Admit: 2020-05-19 | Discharge: 2020-05-19 | Disposition: A | Discharge status: Home / Self Care | Payer: Medicare Other | Source: Ambulatory Visit | Attending: Vascular Surgery | Admitting: Vascular Surgery

## 2020-05-19 ENCOUNTER — Other Ambulatory Visit (HOSPITAL_COMMUNITY): Payer: Self-pay | Admitting: Internal Medicine

## 2020-05-19 DIAGNOSIS — L97812 Non-pressure chronic ulcer of other part of right lower leg with fat layer exposed: Secondary | ICD-10-CM | POA: Diagnosis not present

## 2020-05-31 ENCOUNTER — Other Ambulatory Visit: Payer: Self-pay | Admitting: Family Medicine

## 2020-06-02 ENCOUNTER — Other Ambulatory Visit: Payer: Self-pay | Admitting: Family Medicine

## 2020-06-02 DIAGNOSIS — I10 Essential (primary) hypertension: Secondary | ICD-10-CM

## 2020-06-05 ENCOUNTER — Ambulatory Visit: Payer: Medicare Other | Admitting: Family Medicine

## 2020-06-06 ENCOUNTER — Ambulatory Visit (INDEPENDENT_AMBULATORY_CARE_PROVIDER_SITE_OTHER): Payer: Medicare Other | Admitting: Family Medicine

## 2020-06-06 ENCOUNTER — Encounter: Payer: Self-pay | Admitting: Family Medicine

## 2020-06-06 ENCOUNTER — Other Ambulatory Visit: Payer: Self-pay

## 2020-06-06 VITALS — BP 118/72 | HR 67 | Temp 97.6°F | Ht 66.0 in | Wt 230.0 lb

## 2020-06-06 DIAGNOSIS — E1159 Type 2 diabetes mellitus with other circulatory complications: Secondary | ICD-10-CM | POA: Diagnosis not present

## 2020-06-06 DIAGNOSIS — I10 Essential (primary) hypertension: Secondary | ICD-10-CM

## 2020-06-06 DIAGNOSIS — E782 Mixed hyperlipidemia: Secondary | ICD-10-CM | POA: Diagnosis not present

## 2020-06-06 MED ORDER — TRAMADOL HCL 50 MG PO TABS: 50.0000 mg | ORAL_TABLET | Freq: Three times a day (TID) | ORAL | 2 refills | Status: DC | PRN

## 2020-06-06 NOTE — Patient Instructions (Signed)
Discussed checking BP in 1- 2 weeks with nurse visit. Routine ER precautions given.     If you have lab work done today you will be contacted with your lab results within the next 2 weeks.  If you have not heard from us then please contact us. The fastest way to get your results is to register for My Chart.   IF you received an x-ray today, you will receive an invoice from [facility]. Please contact [facility] at [phone] with questions or concerns regarding your invoice.   IF you received labwork today, you will receive an invoice from [facility]. Please contact [facility] at [phone] with questions or concerns regarding your invoice.   Our billing staff will not be able to assist you with questions regarding bills from these companies.  You will be contacted with the lab results as soon as they are available. The fastest way to get your results is to activate your My Chart account. Instructions are located on the last page of this paperwork. If you have not heard from us regarding the results in 2 weeks, please contact this office.     
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
## 2020-06-07 LAB — CMP14+EGFR
ALT: 18 IU/L (ref 0–44)
AST: 20 IU/L (ref 0–40)
Albumin/Globulin Ratio: 1.5 (ref 1.2–2.2)
Albumin: 4 g/dL (ref 3.7–4.7)
Alkaline Phosphatase: 90 IU/L (ref 48–121)
BUN/Creatinine Ratio: 19 (ref 10–24)
BUN: 22 mg/dL (ref 8–27)
Bilirubin Total: 0.4 mg/dL (ref 0.0–1.2)
CO2: 23 mmol/L (ref 20–29)
Calcium: 9.5 mg/dL (ref 8.6–10.2)
Chloride: 102 mmol/L (ref 96–106)
Creatinine, Ser: 1.13 mg/dL (ref 0.76–1.27)
GFR calc Af Amer: 72 mL/min/{1.73_m2} (ref >59)
GFR calc non Af Amer: 62 mL/min/{1.73_m2} (ref >59)
Globulin, Total: 2.6 g/dL (ref 1.5–4.5)
Glucose: 84 mg/dL (ref 65–99)
Potassium: 4.5 mmol/L (ref 3.5–5.2)
Sodium: 140 mmol/L (ref 134–144)
Total Protein: 6.6 g/dL (ref 6.0–8.5)

## 2020-06-07 LAB — LIPID PANEL
Chol/HDL Ratio: 4.2 ratio (ref 0.0–5.0)
Cholesterol, Total: 157 mg/dL (ref 100–199)
HDL: 37 mg/dL — ABNORMAL LOW (ref >39)
LDL Chol Calc (NIH): 93 mg/dL (ref 0–99)
Triglycerides: 151 mg/dL — ABNORMAL HIGH (ref 0–149)
VLDL Cholesterol Cal: 27 mg/dL (ref 5–40)

## 2020-06-07 LAB — HEMOGLOBIN A1C
Est. average glucose Bld gHb Est-mCnc: 189 mg/dL
Hgb A1c MFr Bld: 8.2 % — ABNORMAL HIGH (ref 4.8–5.6)

## 2020-06-27 ENCOUNTER — Other Ambulatory Visit: Payer: Self-pay | Admitting: Family Medicine

## 2020-06-27 MED ORDER — SITAGLIPTIN PHOSPHATE 50 MG PO TABS: 50.0000 mg | ORAL_TABLET | Freq: Every day | ORAL | 5 refills | Status: DC

## 2020-10-16 ENCOUNTER — Telehealth: Payer: Self-pay | Admitting: Internal Medicine

## 2020-10-16 NOTE — Telephone Encounter (Signed)
Patient would like to transfer care to Dr. Posey Rea. He is unsatisfied with his current provider and was recommended by his relatives to see Dr. Posey Rea.   Patient also mentioned he only has enough blood pressure medication to last him until the end of January.  Please advise.

## 2020-10-31 NOTE — Telephone Encounter (Signed)
Unfortunately, I'm not able to accept any more new patients at this time.  I'm sorry! Thank you!  

## 2020-11-01 NOTE — Telephone Encounter (Signed)
OK then. OK to sch OV Thx

## 2020-11-01 NOTE — Telephone Encounter (Signed)
Patient came by the office again to ask Dr. Posey Rea to take him on as a new patient.   His cousin Cherylann Parr is already an established patient with Dr. Posey Rea. Per Maisie Fus Dr. Posey Rea agreed to see the patient.   Please advise.

## 2020-11-02 NOTE — Telephone Encounter (Signed)
Called pt made appt for 12/19/19 @ 9:10...Kevin Weaver

## 2020-12-12 DIAGNOSIS — E119 Type 2 diabetes mellitus without complications: Secondary | ICD-10-CM | POA: Diagnosis not present

## 2020-12-12 DIAGNOSIS — Z961 Presence of intraocular lens: Secondary | ICD-10-CM | POA: Diagnosis not present

## 2020-12-12 DIAGNOSIS — H26493 Other secondary cataract, bilateral: Secondary | ICD-10-CM | POA: Diagnosis not present

## 2020-12-12 DIAGNOSIS — H35039 Hypertensive retinopathy, unspecified eye: Secondary | ICD-10-CM | POA: Insufficient documentation

## 2020-12-12 DIAGNOSIS — H35033 Hypertensive retinopathy, bilateral: Secondary | ICD-10-CM | POA: Diagnosis not present

## 2020-12-18 ENCOUNTER — Other Ambulatory Visit: Payer: Self-pay

## 2020-12-18 ENCOUNTER — Encounter: Payer: Self-pay | Admitting: Internal Medicine

## 2020-12-18 ENCOUNTER — Ambulatory Visit (INDEPENDENT_AMBULATORY_CARE_PROVIDER_SITE_OTHER): Payer: Medicare Other | Admitting: Internal Medicine

## 2020-12-18 VITALS — BP 142/84 | HR 59 | Temp 98.1°F | Ht 66.0 in | Wt 210.2 lb

## 2020-12-18 DIAGNOSIS — E785 Hyperlipidemia, unspecified: Secondary | ICD-10-CM | POA: Diagnosis not present

## 2020-12-18 DIAGNOSIS — R059 Cough, unspecified: Secondary | ICD-10-CM

## 2020-12-18 DIAGNOSIS — I10 Essential (primary) hypertension: Secondary | ICD-10-CM | POA: Diagnosis not present

## 2020-12-18 DIAGNOSIS — E1159 Type 2 diabetes mellitus with other circulatory complications: Secondary | ICD-10-CM | POA: Diagnosis not present

## 2020-12-18 DIAGNOSIS — E559 Vitamin D deficiency, unspecified: Secondary | ICD-10-CM | POA: Diagnosis not present

## 2020-12-18 DIAGNOSIS — Q6689 Other  specified congenital deformities of feet: Secondary | ICD-10-CM | POA: Diagnosis not present

## 2020-12-18 DIAGNOSIS — R0989 Other specified symptoms and signs involving the circulatory and respiratory systems: Secondary | ICD-10-CM | POA: Diagnosis not present

## 2020-12-18 DIAGNOSIS — N320 Bladder-neck obstruction: Secondary | ICD-10-CM

## 2020-12-18 LAB — CBC WITH DIFFERENTIAL/PLATELET
Basophils Absolute: 0.1 10*3/uL (ref 0.0–0.1)
Basophils Relative: 1 % (ref 0.0–3.0)
Eosinophils Absolute: 0 10*3/uL (ref 0.0–0.7)
Eosinophils Relative: 0.2 % (ref 0.0–5.0)
HCT: 33.8 % — ABNORMAL LOW (ref 39.0–52.0)
Hemoglobin: 11.3 g/dL — ABNORMAL LOW (ref 13.0–17.0)
Lymphocytes Relative: 20 % (ref 12.0–46.0)
Lymphs Abs: 1.3 10*3/uL (ref 0.7–4.0)
MCHC: 33.5 g/dL (ref 30.0–36.0)
MCV: 93.3 fl (ref 78.0–100.0)
Monocytes Absolute: 1 10*3/uL (ref 0.1–1.0)
Monocytes Relative: 15.7 % — ABNORMAL HIGH (ref 3.0–12.0)
Neutro Abs: 4.1 10*3/uL (ref 1.4–7.7)
Neutrophils Relative %: 63.1 % (ref 43.0–77.0)
Platelets: 455 10*3/uL — ABNORMAL HIGH (ref 150.0–400.0)
RBC: 3.63 Mil/uL — ABNORMAL LOW (ref 4.22–5.81)
RDW: 14.5 % (ref 11.5–15.5)
WBC: 6.6 10*3/uL (ref 4.0–10.5)

## 2020-12-18 LAB — URINALYSIS
Bilirubin Urine: NEGATIVE
Hgb urine dipstick: NEGATIVE
Ketones, ur: NEGATIVE
Leukocytes,Ua: NEGATIVE
Nitrite: NEGATIVE
Specific Gravity, Urine: <=1.005 — AB (ref 1.000–1.030)
Total Protein, Urine: NEGATIVE
Urine Glucose: NEGATIVE
Urobilinogen, UA: 0.2 (ref 0.0–1.0)
pH: 6.5 (ref 5.0–8.0)

## 2020-12-18 LAB — COMPREHENSIVE METABOLIC PANEL
ALT: 18 U/L (ref 0–53)
AST: 22 U/L (ref 0–37)
Albumin: 3.5 g/dL (ref 3.5–5.2)
Alkaline Phosphatase: 72 U/L (ref 39–117)
BUN: 12 mg/dL (ref 6–23)
CO2: 31 mEq/L (ref 19–32)
Calcium: 9.2 mg/dL (ref 8.4–10.5)
Chloride: 100 mEq/L (ref 96–112)
Creatinine, Ser: 0.81 mg/dL (ref 0.40–1.50)
GFR: 83.95 mL/min (ref >60.00)
Glucose, Bld: 76 mg/dL (ref 70–99)
Potassium: 3.3 mEq/L — ABNORMAL LOW (ref 3.5–5.1)
Sodium: 142 mEq/L (ref 135–145)
Total Bilirubin: 0.6 mg/dL (ref 0.2–1.2)
Total Protein: 6.5 g/dL (ref 6.0–8.3)

## 2020-12-18 LAB — LIPID PANEL
Cholesterol: 107 mg/dL (ref 0–200)
HDL: 29 mg/dL — ABNORMAL LOW (ref >39.00)
LDL Cholesterol: 52 mg/dL (ref 0–99)
NonHDL: 77.61
Total CHOL/HDL Ratio: 4
Triglycerides: 128 mg/dL (ref 0.0–149.0)
VLDL: 25.6 mg/dL (ref 0.0–40.0)

## 2020-12-18 LAB — VITAMIN D 25 HYDROXY (VIT D DEFICIENCY, FRACTURES): VITD: 16.43 ng/mL — ABNORMAL LOW (ref 30.00–100.00)

## 2020-12-18 LAB — MICROALBUMIN / CREATININE URINE RATIO
Creatinine,U: 37.9 mg/dL
Microalb Creat Ratio: 1.8 mg/g (ref 0.0–30.0)
Microalb, Ur: <0.7 mg/dL (ref 0.0–1.9)

## 2020-12-18 LAB — PSA: PSA: 0.41 ng/mL (ref 0.10–4.00)

## 2020-12-18 LAB — HEMOGLOBIN A1C: Hgb A1c MFr Bld: 6.9 % — ABNORMAL HIGH (ref 4.6–6.5)

## 2020-12-18 LAB — TSH: TSH: 1.82 u[IU]/mL (ref 0.35–4.50)

## 2020-12-18 MED ORDER — GLIPIZIDE ER 10 MG PO TB24: 10.0000 mg | ORAL_TABLET | Freq: Two times a day (BID) | ORAL | 1 refills | Status: DC

## 2020-12-18 MED ORDER — METFORMIN HCL 1000 MG PO TABS: ORAL_TABLET | ORAL | 3 refills | Status: DC

## 2020-12-18 MED ORDER — OMEPRAZOLE 20 MG PO CPDR: DELAYED_RELEASE_CAPSULE | ORAL | 3 refills | Status: DC

## 2020-12-18 MED ORDER — AMLODIPINE BESYLATE 10 MG PO TABS: 10.0000 mg | ORAL_TABLET | Freq: Every day | ORAL | 3 refills | Status: DC

## 2020-12-18 MED ORDER — PIOGLITAZONE HCL 45 MG PO TABS: 45.0000 mg | ORAL_TABLET | Freq: Every day | ORAL | 1 refills | Status: DC

## 2020-12-18 MED ORDER — BENAZEPRIL-HYDROCHLOROTHIAZIDE 20-12.5 MG PO TABS: 2.0000 | ORAL_TABLET | Freq: Every day | ORAL | 3 refills | Status: DC
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
## 2020-12-18 NOTE — Assessment & Plan Note (Signed)
Carotid Doppler ASA - bruising

## 2020-12-18 NOTE — Progress Notes (Signed)
Subjective:  Patient ID: Kevin Weaver, male    DOB: January 10, 1941  Age: 80 y.o. MRN: [id number]  CC: New Patient (Initial Visit) (Transferring from Dr. Lezlie Lye)   HPI Kevin Weaver presents for a new pt visit. C/o cough x 3 weeks  C/o DM, GERD, HTN, OA  Outpatient Medications Prior to Visit  Medication Sig Dispense Refill  . benazepril-hydrochlorthiazide (LOTENSIN HCT) 20-12.5 MG tablet TAKE 2 TABLETS BY MOUTH EVERY DAY 180 tablet 3  . glipiZIDE (GLUCOTROL XL) 10 MG 24 hr tablet Take 1 tablet (10 mg total) by mouth 2 (two) times daily. 180 tablet 1  . metFORMIN (GLUCOPHAGE) 1000 MG tablet TAKE 1 TABLET BY MOUTH TWICE A DAY WITH A MEAL 180 tablet 1  . omeprazole (PRILOSEC) 20 MG capsule TAKE 1 CAPSULE BY MOUTH EVERY DAY 90 capsule 1  . pioglitazone (ACTOS) 45 MG tablet TAKE 1 TABLET BY MOUTH EVERY DAY 90 tablet 1  . amLODipine (NORVASC) 10 MG tablet TAKE 1 TABLET BY MOUTH EVERY DAY (Patient not taking: Reported on 12/18/2020) 90 tablet 3  . sitaGLIPtin (JANUVIA) 50 MG tablet Take 1 tablet (50 mg total) by mouth daily. (Patient not taking: Reported on 12/18/2020) 30 tablet 5  . traMADol (ULTRAM) 50 MG tablet Take 1 tablet (50 mg total) by mouth every 8 (eight) hours as needed. (Patient not taking: Reported on 12/18/2020) 30 tablet 2   No facility-administered medications prior to visit.    ROS: Review of Systems  Constitutional: Negative for appetite change, fatigue and unexpected weight change.  HENT: Negative for congestion, nosebleeds, sneezing, sore throat and trouble swallowing.   Eyes: Negative for itching and visual disturbance.  Respiratory: Positive for cough.   Cardiovascular: Negative for chest pain, palpitations and leg swelling.  Gastrointestinal: Negative for abdominal distention, blood in stool, diarrhea and nausea.  Genitourinary: Negative for frequency and hematuria.  Musculoskeletal: Positive for arthralgias, back pain and gait problem. Negative for joint  swelling and neck pain.  Skin: Negative for rash.  Neurological: Negative for dizziness, tremors, speech difficulty and weakness.  Psychiatric/Behavioral: Negative for agitation, dysphoric mood, sleep disturbance and suicidal ideas. The patient is not nervous/anxious.     Objective:  BP (!) 142/84 (BP Location: Left Arm)   Pulse (!) 59   Temp 98.1 F (36.7 C) (Oral)   Ht 5\' 6"  (1.676 m)   Wt 210 lb 3.2 oz (95.3 kg)   SpO2 97%   BMI 33.93 kg/m   BP Readings from Last 3 Encounters:  12/18/20 (!) 142/84  06/06/20 118/72  03/21/20 138/79    Wt Readings from Last 3 Encounters:  12/18/20 210 lb 3.2 oz (95.3 kg)  06/06/20 230 lb (104.3 kg)  03/21/20 234 lb (106.1 kg)    Physical Exam Constitutional:      General: He is not in acute distress.    Appearance: He is well-developed.     Comments: NAD  HENT:     Mouth/Throat:     Mouth: Oropharynx is clear and moist.  Eyes:     Conjunctiva/sclera: Conjunctivae normal.     Pupils: Pupils are equal, round, and reactive to light.  Neck:     Thyroid: No thyromegaly.     Vascular: Carotid bruit present. No JVD.  Cardiovascular:     Rate and Rhythm: Normal rate and regular rhythm.     Pulses: Intact distal pulses.     Heart sounds: Murmur heard.  No friction rub. No gallop.   Pulmonary:  Effort: Pulmonary effort is normal. No respiratory distress.     Breath sounds: Normal breath sounds. No wheezing or rales.  Chest:     Chest wall: No tenderness.  Abdominal:     General: Bowel sounds are normal. There is no distension.     Palpations: Abdomen is soft. There is no mass.     Tenderness: There is no abdominal tenderness. There is no guarding or rebound.  Musculoskeletal:        General: No tenderness. Normal range of motion.     Cervical back: Normal range of motion.     Right lower leg: Edema present.     Left lower leg: Edema present.  Lymphadenopathy:     Cervical: No cervical adenopathy.  Skin:    General: Skin is  warm and dry.     Findings: No rash.  Neurological:     Mental Status: He is alert and oriented to person, place, and time.     Cranial Nerves: No cranial nerve deficit.     Motor: No abnormal muscle tone.     Coordination: He displays a negative Romberg sign. Coordination abnormal.     Gait: Gait abnormal.     Deep Tendon Reflexes: Reflexes are normal and symmetric.  Psychiatric:        Mood and Affect: Mood and affect normal.        Behavior: Behavior normal.        Thought Content: Thought content normal.        Judgment: Judgment normal.   pt lost his R foot and R fingers - accidents   L>R  Bruit  Missing fingers R, R club foot  Lab Results  Component Value Date   WBC 7.1 08/25/2016   HGB 15.1 08/25/2016   HCT 43.6 08/25/2016   PLT 269 08/25/2016   GLUCOSE 84 06/06/2020   CHOL 157 06/06/2020   TRIG 151 (H) 06/06/2020   HDL 37 (L) 06/06/2020   LDLCALC 93 06/06/2020   ALT 18 06/06/2020   AST 20 06/06/2020   NA 140 06/06/2020   K 4.5 06/06/2020   CL 102 06/06/2020   CREATININE 1.13 06/06/2020   BUN 22 06/06/2020   CO2 23 06/06/2020   TSH 3.380 11/26/2018   PSA 0.79 01/03/2016   HGBA1C 8.2 (H) 06/06/2020   MICROALBUR 0.2 01/04/2015    VAS [place] ABI WITH/WO TBI  Result Date: 05/19/2020 LOWER EXTREMITY DOPPLER STUDY Indications: Ulceration.  Performing Technologist: Dorthula Matas RVS, RCS  Examination Guidelines: A complete evaluation includes at minimum, Doppler waveform signals and systolic blood pressure reading at the level of bilateral brachial, anterior tibial, and posterior tibial arteries, when vessel segments are accessible. Bilateral testing is considered an integral part of a complete examination. Photoelectric Plethysmograph (PPG) waveforms and toe systolic pressure readings are included as required and additional duplex testing as needed. Limited examinations for reoccurring indications may be performed as noted.  ABI Findings:  +---------+------------------+-----+--------+--------+ Right    Rt Pressure (mmHg)IndexWaveformComment  +---------+------------------+-----+--------+--------+ Brachial 133                                     +---------+------------------+-----+--------+--------+ PTA      135               1.00 biphasic         +---------+------------------+-----+--------+--------+ DP       133  0.99 biphasic         +---------+------------------+-----+--------+--------+ Great Toe98                0.73                  +---------+------------------+-----+--------+--------+ +---------+------------------+-----+---------+-------+ Left     Lt Pressure (mmHg)IndexWaveform Comment +---------+------------------+-----+---------+-------+ Brachial 135                                     +---------+------------------+-----+---------+-------+ PTA      145               1.07 triphasic        +---------+------------------+-----+---------+-------+ DP       144               1.07 triphasic        +---------+------------------+-----+---------+-------+ Great Toe107               0.79                  +---------+------------------+-----+---------+-------+ +-------+-----------+-----------+------------+------------+ ABI/TBIToday's ABIToday's TBIPrevious ABIPrevious TBI +-------+-----------+-----------+------------+------------+ Right  1.00       0.73                                +-------+-----------+-----------+------------+------------+ Left   1.07       0.79                                +-------+-----------+-----------+------------+------------+  Summary: Right: Resting right ankle-brachial index is within normal range. No evidence of significant right lower extremity arterial disease. The right toe-brachial index is normal. Left: Resting left ankle-brachial index is within normal range. No evidence of significant left lower extremity arterial disease. The  left toe-brachial index is normal.  *See table(s) above for measurements and observations.  Electronically signed by Sherald Hess MD on 05/19/2020 at 12:56:39 PM.   Final     Assessment & Plan:    Sonda Primes, MD

## 2020-12-18 NOTE — Assessment & Plan Note (Signed)
Benazepril HCT, Norvasc 10

## 2020-12-19 ENCOUNTER — Telehealth: Payer: Self-pay | Admitting: *Deleted

## 2020-12-19 ENCOUNTER — Other Ambulatory Visit: Payer: Self-pay | Admitting: Internal Medicine

## 2020-12-19 DIAGNOSIS — R0989 Other specified symptoms and signs involving the circulatory and respiratory systems: Secondary | ICD-10-CM

## 2020-12-19 MED ORDER — VITAMIN D3 50 MCG (2000 UT) PO CAPS: 2000.0000 [IU] | ORAL_CAPSULE | Freq: Every day | ORAL | 3 refills | Status: DC

## 2020-12-19 MED ORDER — POTASSIUM CHLORIDE CRYS ER 20 MEQ PO TBCR: 20.0000 meq | EXTENDED_RELEASE_TABLET | Freq: Every day | ORAL | 1 refills | Status: DC

## 2020-12-19 MED ORDER — VITAMIN D3 1.25 MG (50000 UT) PO CAPS: 1.0000 | ORAL_CAPSULE | ORAL | 0 refills | Status: DC

## 2020-12-19 NOTE — Telephone Encounter (Signed)
Entered.. 1st one was wrong order,,/lmb

## 2020-12-19 NOTE — Telephone Encounter (Signed)
-----   Message from Lysle Rubens sent at 12/19/2020  2:11 PM EST ----- Jani Files,  Can you please change pt's carotid US to [id number] with [facility]?  Thanks Lawson Fiscal

## 2020-12-20 ENCOUNTER — Inpatient Hospital Stay (HOSPITAL_COMMUNITY): Admission: RE | Admit: 2020-12-20 | Payer: Medicare Other | Source: Ambulatory Visit

## 2020-12-27 ENCOUNTER — Other Ambulatory Visit: Payer: Self-pay

## 2020-12-27 ENCOUNTER — Other Ambulatory Visit: Payer: Self-pay | Admitting: Internal Medicine

## 2020-12-27 ENCOUNTER — Ambulatory Visit (HOSPITAL_COMMUNITY)
Admission: RE | Admit: 2020-12-27 | Discharge: 2020-12-27 | Disposition: A | Discharge status: Home / Self Care | Payer: Medicare Other | Source: Ambulatory Visit | Attending: Cardiology | Admitting: Cardiology

## 2020-12-27 DIAGNOSIS — R0989 Other specified symptoms and signs involving the circulatory and respiratory systems: Secondary | ICD-10-CM | POA: Diagnosis not present

## 2020-12-27 DIAGNOSIS — I6523 Occlusion and stenosis of bilateral carotid arteries: Secondary | ICD-10-CM | POA: Insufficient documentation

## 2020-12-27 MED ORDER — ASPIRIN EC 81 MG PO TBEC: 81.0000 mg | DELAYED_RELEASE_TABLET | Freq: Every day | ORAL | 3 refills | Status: AC

## 2021-01-10 ENCOUNTER — Other Ambulatory Visit: Payer: Self-pay | Admitting: Internal Medicine

## 2021-01-29 ENCOUNTER — Other Ambulatory Visit: Payer: Self-pay

## 2021-01-30 ENCOUNTER — Encounter: Payer: Self-pay | Admitting: Internal Medicine

## 2021-01-30 ENCOUNTER — Ambulatory Visit (INDEPENDENT_AMBULATORY_CARE_PROVIDER_SITE_OTHER): Payer: Medicare Other | Admitting: Internal Medicine

## 2021-01-30 VITALS — BP 132/70 | HR 68 | Temp 98.1°F | Ht 66.0 in | Wt 211.0 lb

## 2021-01-30 DIAGNOSIS — I10 Essential (primary) hypertension: Secondary | ICD-10-CM

## 2021-01-30 DIAGNOSIS — L97511 Non-pressure chronic ulcer of other part of right foot limited to breakdown of skin: Secondary | ICD-10-CM | POA: Diagnosis not present

## 2021-01-30 DIAGNOSIS — E785 Hyperlipidemia, unspecified: Secondary | ICD-10-CM | POA: Diagnosis not present

## 2021-01-30 DIAGNOSIS — I6523 Occlusion and stenosis of bilateral carotid arteries: Secondary | ICD-10-CM | POA: Diagnosis not present

## 2021-01-30 DIAGNOSIS — L97509 Non-pressure chronic ulcer of other part of unspecified foot with unspecified severity: Secondary | ICD-10-CM | POA: Insufficient documentation

## 2021-01-30 DIAGNOSIS — M19079 Primary osteoarthritis, unspecified ankle and foot: Secondary | ICD-10-CM

## 2021-01-30 DIAGNOSIS — E1159 Type 2 diabetes mellitus with other circulatory complications: Secondary | ICD-10-CM | POA: Diagnosis not present

## 2021-01-30 MED ORDER — TRAMADOL HCL 50 MG PO TABS: 50.0000 mg | ORAL_TABLET | Freq: Three times a day (TID) | ORAL | 2 refills | Status: DC | PRN

## 2021-01-30 MED ORDER — OMEPRAZOLE 20 MG PO CPDR: DELAYED_RELEASE_CAPSULE | ORAL | 3 refills | Status: DC

## 2021-01-30 MED ORDER — PRAVASTATIN SODIUM 20 MG PO TABS: 20.0000 mg | ORAL_TABLET | Freq: Every day | ORAL | 3 refills | Status: DC

## 2021-01-30 NOTE — Progress Notes (Signed)
Subjective:  Patient ID: Kevin Weaver, male    DOB: 1941-03-31  Age: 80 y.o. MRN: [id number]  CC: No chief complaint on file.   HPI Kevin Weaver presents for HTN, DM, carotid stenosis f/u. Cough has resolved.  Outpatient Medications Prior to Visit  Medication Sig Dispense Refill  . amLODipine (NORVASC) 10 MG tablet Take 1 tablet (10 mg total) by mouth daily. 90 tablet 3  . aspirin EC 81 MG tablet Take 1 tablet (81 mg total) by mouth daily. 100 tablet 3  . benazepril-hydrochlorthiazide (LOTENSIN HCT) 20-12.5 MG tablet Take 2 tablets by mouth daily. 180 tablet 3  . Cholecalciferol (VITAMIN D3) 1.25 MG (50000 UT) CAPS Take 1 capsule by mouth once a week. 6 capsule 0  . Cholecalciferol (VITAMIN D3) 50 MCG (2000 UT) capsule Take 1 capsule (2,000 Units total) by mouth daily. 100 capsule 3  . glipiZIDE (GLUCOTROL XL) 10 MG 24 hr tablet Take 1 tablet (10 mg total) by mouth 2 (two) times daily. 180 tablet 1  . KLOR-CON M20 20 MEQ tablet TAKE 1 TABLET BY MOUTH EVERY DAY 30 tablet 5  . metFORMIN (GLUCOPHAGE) 1000 MG tablet TAKE 1 TABLET BY MOUTH TWICE A DAY WITH A MEAL 180 tablet 3  . omeprazole (PRILOSEC) 20 MG capsule TAKE 1 CAPSULE BY MOUTH EVERY DAY 90 capsule 3  . pioglitazone (ACTOS) 45 MG tablet Take 1 tablet (45 mg total) by mouth daily. 90 tablet 1   No facility-administered medications prior to visit.    ROS: Review of Systems  Constitutional: Negative for appetite change, fatigue and unexpected weight change.  HENT: Negative for congestion, nosebleeds, sneezing, sore throat and trouble swallowing.   Eyes: Negative for itching and visual disturbance.  Respiratory: Negative for cough.   Cardiovascular: Negative for chest pain, palpitations and leg swelling.  Gastrointestinal: Negative for abdominal distention, blood in stool, diarrhea and nausea.  Genitourinary: Negative for frequency and hematuria.  Musculoskeletal: Positive for arthralgias and gait problem. Negative for back  pain, joint swelling and neck pain.  Skin: Negative for rash.  Neurological: Negative for dizziness, tremors, speech difficulty and weakness.  Psychiatric/Behavioral: Negative for agitation, dysphoric mood and sleep disturbance. The patient is not nervous/anxious.     Objective:  There were no vitals taken for this visit.  BP Readings from Last 3 Encounters:  12/18/20 (!) 142/84  06/06/20 118/72  03/21/20 138/79    Wt Readings from Last 3 Encounters:  12/18/20 210 lb 3.2 oz (95.3 kg)  06/06/20 230 lb (104.3 kg)  03/21/20 234 lb (106.1 kg)    Physical Exam Constitutional:      General: He is not in acute distress.    Appearance: He is well-developed. He is obese.     Comments: NAD  Eyes:     Conjunctiva/sclera: Conjunctivae normal.     Pupils: Pupils are equal, round, and reactive to light.  Neck:     Thyroid: No thyromegaly.     Vascular: No JVD.  Cardiovascular:     Rate and Rhythm: Normal rate and regular rhythm.     Heart sounds: Normal heart sounds. No murmur heard. No friction rub. No gallop.   Pulmonary:     Effort: Pulmonary effort is normal. No respiratory distress.     Breath sounds: Normal breath sounds. No wheezing or rales.  Chest:     Chest wall: No tenderness.  Abdominal:     General: Bowel sounds are normal. There is no distension.  Palpations: Abdomen is soft. There is no mass.     Tenderness: There is no abdominal tenderness. There is no guarding or rebound.  Musculoskeletal:        General: No tenderness. Normal range of motion.     Cervical back: Normal range of motion.  Lymphadenopathy:     Cervical: No cervical adenopathy.  Skin:    General: Skin is warm and dry.     Findings: No rash.  Neurological:     Mental Status: He is alert and oriented to person, place, and time.     Cranial Nerves: No cranial nerve deficit.     Motor: No abnormal muscle tone.     Coordination: Coordination abnormal.     Gait: Gait abnormal.     Deep Tendon  Reflexes: Reflexes are normal and symmetric.  Psychiatric:        Behavior: Behavior normal.        Thought Content: Thought content normal.        Judgment: Judgment normal.   R fingers are missing [place] R foot - lat 1 cm ulcer  Lab Results  Component Value Date   WBC 6.6 12/18/2020   HGB 11.3 (L) 12/18/2020   HCT 33.8 (L) 12/18/2020   PLT 455.0 (H) 12/18/2020   GLUCOSE 76 12/18/2020   CHOL 107 12/18/2020   TRIG 128.0 12/18/2020   HDL 29.00 (L) 12/18/2020   LDLCALC 52 12/18/2020   ALT 18 12/18/2020   AST 22 12/18/2020   NA 142 12/18/2020   K 3.3 (L) 12/18/2020   CL 100 12/18/2020   CREATININE 0.81 12/18/2020   BUN 12 12/18/2020   CO2 31 12/18/2020   TSH 1.82 12/18/2020   PSA 0.41 12/18/2020   HGBA1C 6.9 (H) 12/18/2020   MICROALBUR <0.7 12/18/2020    VAS US CAROTID  Result Date: 12/28/2020 Carotid Arterial Duplex Study Indications:       Left bruit and patient denies any cerebrovascular symptoms. Risk Factors:      Hypertension, hyperlipidemia, Diabetes, no history of                    smoking. Passive smoke exposure. Comparison Study:  NA Performing Technologist: Tyna Jaksch RVT  Examination Guidelines: A complete evaluation includes B-mode imaging, spectral Doppler, color Doppler, and power Doppler as needed of all accessible portions of each vessel. Bilateral testing is considered an integral part of a complete examination. Limited examinations for reoccurring indications may be performed as noted.  Right Carotid Findings: +----------+--------+--------+--------+------------------+--------+           PSV cm/sEDV cm/sStenosisPlaque DescriptionComments +----------+--------+--------+--------+------------------+--------+ CCA Prox  69      20                                         +----------+--------+--------+--------+------------------+--------+ CCA Distal44      16      <50%    heterogenous                +----------+--------+--------+--------+------------------+--------+ ICA Prox  37      14              heterogenous               +----------+--------+--------+--------+------------------+--------+ ICA Mid   47      21      1-39%   heterogenous               +----------+--------+--------+--------+------------------+--------+  ICA Distal80      39                                         +----------+--------+--------+--------+------------------+--------+ ECA       52      11              heterogenous               +----------+--------+--------+--------+------------------+--------+ +----------+--------+-------+---------+-------------------+           PSV cm/sEDV cmsDescribe Arm Pressure (mmHG) +----------+--------+-------+---------+-------------------+ [id number]             Turbulent140                 +----------+--------+-------+---------+-------------------+ +---------+--------+-+--------+-+------+ VertebralPSV cm/s0EDV cm/s0Absent +---------+--------+-+--------+-+------+ Prominent subclavian artery, measuring 1.7 cm AP x 1.7 cm TRV. Normal flow velocities in the innominate. Left Carotid Findings: +----------+-------+-------+--------+------------------------+-----------------+           PSV    EDV    StenosisPlaque Description      Comments                    cm/s   cm/s                                                     +----------+-------+-------+--------+------------------------+-----------------+ CCA Prox  47     16             heterogenous                              +----------+-------+-------+--------+------------------------+-----------------+ CCA Mid   50     18     <50%    heterogenous            intimal                                                                   thickening        +----------+-------+-------+--------+------------------------+-----------------+ CCA Distal37     10             heterogenous             intimal                                                                   thickening        +----------+-------+-------+--------+------------------------+-----------------+ ICA Prox  113    39             heterogenous and  irregular                                 +----------+-------+-------+--------+------------------------+-----------------+ ICA Mid   150    45     40-59%  heterogenous            turbulent         +----------+-------+-------+--------+------------------------+-----------------+ ICA Distal70     35                                     tortuous          +----------+-------+-------+--------+------------------------+-----------------+ ECA       42     9                                      intimal                                                                   thickening        +----------+-------+-------+--------+------------------------+-----------------+ +----------+--------+--------+----------------+-------------------+           PSV cm/sEDV cm/sDescribe        Arm Pressure (mmHG) +----------+--------+--------+----------------+-------------------+ [id number]              Multiphasic, PIR518                 +----------+--------+--------+----------------+-------------------+ +---------+--------+--+--------+--+---------+ VertebralPSV cm/s30EDV cm/s16Antegrade +---------+--------+--+--------+--+---------+   Summary: Right Carotid: Velocities in the right ICA are consistent with a 1-39% stenosis.                Non-hemodynamically significant plaque <50% noted in the CCA.                Prominent subclavian artery, measuring 1.7 cm AP x 1.7 cm TRV.                Normal flow velocities in the innominate. 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               Non-hemodynamically significant plaque <50% noted in the CCA. Vertebrals:   Left vertebral artery demonstrates antegrade flow. Right vertebral              artery demonstrates an occlusion. 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 Normal flow              hemodynamics were seen in the left subclavian artery. *See table(s) above for measurements and observations. Suggest follow up study in 12 months. Electronically signed by Dina Rich MD on 12/28/2020 at 9:29:06 AM.    Final     Assessment & Plan:     Sonda Primes, MD

## 2021-01-30 NOTE — Assessment & Plan Note (Signed)
Benazepril HCT, Norvasc 10

## 2021-01-30 NOTE — Assessment & Plan Note (Signed)
Baby aspirin Strongly consider starting on a statin.  Blood pressure control

## 2021-01-30 NOTE — Assessment & Plan Note (Addendum)
On Metformin, glipizide and Actos

## 2021-01-30 NOTE — Assessment & Plan Note (Signed)
R foot - lat 1 cm ulcer Podiatry ref

## 2021-01-30 NOTE — Assessment & Plan Note (Addendum)
  Will try Pravastatin if tolerated

## 2021-02-02 ENCOUNTER — Other Ambulatory Visit: Payer: Self-pay

## 2021-02-02 ENCOUNTER — Ambulatory Visit (INDEPENDENT_AMBULATORY_CARE_PROVIDER_SITE_OTHER): Payer: Medicare Other | Admitting: Podiatry

## 2021-02-02 DIAGNOSIS — L97411 Non-pressure chronic ulcer of right heel and midfoot limited to breakdown of skin: Secondary | ICD-10-CM

## 2021-02-02 DIAGNOSIS — E11621 Type 2 diabetes mellitus with foot ulcer: Secondary | ICD-10-CM

## 2021-02-27 ENCOUNTER — Ambulatory Visit: Payer: Medicare Other | Admitting: Podiatry

## 2021-02-28 NOTE — Progress Notes (Signed)
  Subjective:  Patient ID: Kevin Weaver, male    DOB: 1941-08-12,  MRN: [id number]  Chief Complaint  Patient presents with  . Foot Ulcer      ulcer right foot, diabetic foot care    80 y.o. male presents for wound care. Hx confirmed with patient.  Objective:  Physical Exam: Wound Location: Right fifth met Wound Measurement: 0.2 x 0.2 Wound Base: Granular/Healthy Peri-wound: Calloused Exudate: None: wound tissue dry wound without warmth, erythema, signs of acute infection  Assessment:   1. Diabetic ulcer of right midfoot associated with type 2 diabetes mellitus, limited to breakdown of skin ([place])    Plan:  Patient was evaluated and treated and all questions answered.  Ulcer right foot -Minimal debridement today -Wound dressed with Silvadene and Mepilex. -Surgical shoe dispensed -I think this wound will heal on its mentally discussed that should continue to be an issue to please call for evaluation  Return in about 2 months (around 04/04/2021) for diabetic nail and callus trim.

## 2021-03-30 ENCOUNTER — Other Ambulatory Visit (INDEPENDENT_AMBULATORY_CARE_PROVIDER_SITE_OTHER): Payer: Medicare Other

## 2021-03-30 DIAGNOSIS — E785 Hyperlipidemia, unspecified: Secondary | ICD-10-CM

## 2021-03-30 DIAGNOSIS — E1159 Type 2 diabetes mellitus with other circulatory complications: Secondary | ICD-10-CM

## 2021-03-30 LAB — HEMOGLOBIN A1C: Hgb A1c MFr Bld: 7.2 % — ABNORMAL HIGH (ref 4.6–6.5)

## 2021-04-02 ENCOUNTER — Other Ambulatory Visit: Payer: Self-pay

## 2021-04-02 LAB — LIPID PANEL
Cholesterol: 124 mg/dL (ref 0–200)
HDL: 45.1 mg/dL (ref >39.00)
LDL Cholesterol: 66 mg/dL (ref 0–99)
NonHDL: 78.79
Total CHOL/HDL Ratio: 3
Triglycerides: 63 mg/dL (ref 0.0–149.0)
VLDL: 12.6 mg/dL (ref 0.0–40.0)

## 2021-04-02 LAB — COMPREHENSIVE METABOLIC PANEL
ALT: 15 U/L (ref 0–53)
AST: 27 U/L (ref 0–37)
Albumin: 3.6 g/dL (ref 3.5–5.2)
Alkaline Phosphatase: 49 U/L (ref 39–117)
BUN: 23 mg/dL (ref 6–23)
CO2: 23 mEq/L (ref 19–32)
Calcium: 9 mg/dL (ref 8.4–10.5)
Chloride: 97 mEq/L (ref 96–112)
Creatinine, Ser: 1.07 mg/dL (ref 0.40–1.50)
GFR: 65.94 mL/min (ref >60.00)
Glucose, Bld: 57 mg/dL — ABNORMAL LOW (ref 70–99)
Potassium: 4.1 mEq/L (ref 3.5–5.1)
Sodium: 138 mEq/L (ref 135–145)
Total Bilirubin: 0.4 mg/dL (ref 0.2–1.2)
Total Protein: 6.7 g/dL (ref 6.0–8.3)

## 2021-04-03 ENCOUNTER — Encounter: Payer: Self-pay | Admitting: Internal Medicine

## 2021-04-03 ENCOUNTER — Ambulatory Visit (INDEPENDENT_AMBULATORY_CARE_PROVIDER_SITE_OTHER): Payer: Medicare Other | Admitting: Internal Medicine

## 2021-04-03 DIAGNOSIS — J3089 Other allergic rhinitis: Secondary | ICD-10-CM | POA: Diagnosis not present

## 2021-04-03 DIAGNOSIS — I10 Essential (primary) hypertension: Secondary | ICD-10-CM

## 2021-04-03 DIAGNOSIS — M19079 Primary osteoarthritis, unspecified ankle and foot: Secondary | ICD-10-CM | POA: Diagnosis not present

## 2021-04-03 DIAGNOSIS — E1159 Type 2 diabetes mellitus with other circulatory complications: Secondary | ICD-10-CM

## 2021-04-03 DIAGNOSIS — I6523 Occlusion and stenosis of bilateral carotid arteries: Secondary | ICD-10-CM

## 2021-04-03 MED ORDER — TRAMADOL HCL 50 MG PO TABS: 50.0000 mg | ORAL_TABLET | Freq: Three times a day (TID) | ORAL | 2 refills | Status: DC | PRN

## 2021-04-03 NOTE — Progress Notes (Signed)
Subjective:  Patient ID: Kevin Weaver, male    DOB: [birth year]  Age: 80 y.o. MRN: [id number]  CC: Follow-up (2 month f/u)   HPI Kevin Weaver presents for HTN, DM, GERD and OA f/u  Outpatient Medications Prior to Visit  Medication Sig Dispense Refill  . amLODipine (NORVASC) 10 MG tablet Take 1 tablet (10 mg total) by mouth daily. 90 tablet 3  . aspirin EC 81 MG tablet Take 1 tablet (81 mg total) by mouth daily. 100 tablet 3  . benazepril-hydrochlorthiazide (LOTENSIN HCT) 20-12.5 MG tablet Take 2 tablets by mouth daily. 180 tablet 3  . Cholecalciferol (VITAMIN D3) 50 MCG (2000 UT) capsule Take 1 capsule (2,000 Units total) by mouth daily. 100 capsule 3  . glipiZIDE (GLUCOTROL XL) 10 MG 24 hr tablet Take 1 tablet (10 mg total) by mouth 2 (two) times daily. 180 tablet 1  . KLOR-CON M20 20 MEQ tablet TAKE 1 TABLET BY MOUTH EVERY DAY 30 tablet 5  . metFORMIN (GLUCOPHAGE) 1000 MG tablet TAKE 1 TABLET BY MOUTH TWICE A DAY WITH A MEAL 180 tablet 3  . omeprazole (PRILOSEC) 20 MG capsule TAKE 1 CAPSULE BY MOUTH EVERY DAY 90 capsule 3  . pioglitazone (ACTOS) 45 MG tablet Take 1 tablet (45 mg total) by mouth daily. 90 tablet 1  . pravastatin (PRAVACHOL) 20 MG tablet Take 1 tablet (20 mg total) by mouth daily. 90 tablet 3  . traMADol (ULTRAM) 50 MG tablet Take 1 tablet (50 mg total) by mouth every 8 (eight) hours as needed. 30 tablet 2   No facility-administered medications prior to visit.    ROS: Review of Systems  Constitutional: Negative for appetite change, fatigue and unexpected weight change.  HENT: Negative for congestion, nosebleeds, sneezing, sore throat and trouble swallowing.   Eyes: Negative for itching and visual disturbance.  Respiratory: Negative for cough.   Cardiovascular: Negative for chest pain, palpitations and leg swelling.  Gastrointestinal: Negative for abdominal distention, blood in stool, diarrhea and nausea.  Genitourinary: Negative for frequency and hematuria.   Musculoskeletal: Positive for arthralgias and gait problem. Negative for back pain, joint swelling and neck pain.  Skin: Negative for rash.  Neurological: Negative for dizziness, tremors, speech difficulty and weakness.  Psychiatric/Behavioral: Negative for agitation, dysphoric mood and sleep disturbance. The patient is not nervous/anxious.     Objective:  BP 122/62 (BP Location: Left Arm)   Pulse 63   Temp 98 F (36.7 C) (Oral)   Ht 5\' 6"  (1.676 m)   Wt 212 lb (96.2 kg)   SpO2 97%   BMI 34.22 kg/m   BP Readings from Last 3 Encounters:  04/03/21 122/62  01/30/21 132/70  12/18/20 (!) 142/84    Wt Readings from Last 3 Encounters:  04/03/21 212 lb (96.2 kg)  01/30/21 211 lb (95.7 kg)  12/18/20 210 lb 3.2 oz (95.3 kg)    Physical Exam Constitutional:      General: He is not in acute distress.    Appearance: He is well-developed. He is obese.     Comments: NAD  Eyes:     Conjunctiva/sclera: Conjunctivae normal.     Pupils: Pupils are equal, round, and reactive to light.  Neck:     Thyroid: No thyromegaly.     Vascular: No JVD.  Cardiovascular:     Rate and Rhythm: Normal rate and regular rhythm.     Heart sounds: Normal heart sounds. No murmur heard. No friction rub. No gallop.   Pulmonary:  Effort: Pulmonary effort is normal. No respiratory distress.     Breath sounds: Normal breath sounds. No wheezing or rales.  Chest:     Chest wall: No tenderness.  Abdominal:     General: Bowel sounds are normal. There is no distension.     Palpations: Abdomen is soft. There is no mass.     Tenderness: There is no abdominal tenderness. There is no guarding or rebound.  Musculoskeletal:        General: No tenderness. Normal range of motion.     Cervical back: Normal range of motion.  Lymphadenopathy:     Cervical: No cervical adenopathy.  Skin:    General: Skin is warm and dry.     Findings: No rash.  Neurological:     Mental Status: He is alert and oriented to person,  place, and time.     Cranial Nerves: No cranial nerve deficit.     Motor: No abnormal muscle tone.     Coordination: Coordination normal.     Gait: Gait abnormal.     Deep Tendon Reflexes: Reflexes are normal and symmetric.  Psychiatric:        Behavior: Behavior normal.        Thought Content: Thought content normal.        Judgment: Judgment normal.   Limping  Lab Results  Component Value Date   WBC 6.6 12/18/2020   HGB 11.3 (L) 12/18/2020   HCT 33.8 (L) 12/18/2020   PLT 455.0 (H) 12/18/2020   GLUCOSE 57 (L) 03/30/2021   CHOL 124 03/30/2021   TRIG 63.0 03/30/2021   HDL 45.10 03/30/2021   LDLCALC 66 03/30/2021   ALT 15 03/30/2021   AST 27 03/30/2021   NA 138 03/30/2021   K 4.1 03/30/2021   CL 97 03/30/2021   CREATININE 1.07 03/30/2021   BUN 23 03/30/2021   CO2 23 03/30/2021   TSH 1.82 12/18/2020   PSA 0.41 12/18/2020   HGBA1C 7.2 (H) 03/30/2021   MICROALBUR <0.7 12/18/2020    VAS US CAROTID  Result Date: 12/28/2020 Carotid Arterial Duplex Study Indications:       Left bruit and patient denies any cerebrovascular symptoms. Risk Factors:      Hypertension, hyperlipidemia, Diabetes, no history of                    smoking. Passive smoke exposure. Comparison Study:  NA Performing Technologist: Tyna JakschKeisha Barrett RVT  Examination Guidelines: A complete evaluation includes B-mode imaging, spectral Doppler, color Doppler, and power Doppler as needed of all accessible portions of each vessel. Bilateral testing is considered an integral part of a complete examination. Limited examinations for reoccurring indications may be performed as noted.  Right Carotid Findings: +----------+--------+--------+--------+------------------+--------+           PSV cm/sEDV cm/sStenosisPlaque DescriptionComments +----------+--------+--------+--------+------------------+--------+ CCA Prox  69      20                                          +----------+--------+--------+--------+------------------+--------+ CCA Distal44      16      <50%    heterogenous               +----------+--------+--------+--------+------------------+--------+ ICA Prox  37      14              heterogenous               +----------+--------+--------+--------+------------------+--------+  ICA Mid   47      21      1-39%   heterogenous               +----------+--------+--------+--------+------------------+--------+ ICA Distal80      39                                         +----------+--------+--------+--------+------------------+--------+ ECA       52      11              heterogenous               +----------+--------+--------+--------+------------------+--------+ +----------+--------+-------+---------+-------------------+           PSV cm/sEDV cmsDescribe Arm Pressure (mmHG) +----------+--------+-------+---------+-------------------+ [id number]             [id number]                 +----------+--------+-------+---------+-------------------+ +---------+--------+-+--------+-+------+ VertebralPSV cm/s0EDV cm/s0Absent +---------+--------+-+--------+-+------+ Prominent subclavian artery, measuring 1.7 cm AP x 1.7 cm TRV. Normal flow velocities in the innominate. Left Carotid Findings: +----------+-------+-------+--------+------------------------+-----------------+           PSV    EDV    StenosisPlaque Description      Comments                    cm/s   cm/s                                                     +----------+-------+-------+--------+------------------------+-----------------+ CCA Prox  47     16             heterogenous                              +----------+-------+-------+--------+------------------------+-----------------+ CCA Mid   50     18     <50%    heterogenous            intimal                                                                   thickening         +----------+-------+-------+--------+------------------------+-----------------+ CCA Distal37     10             heterogenous            intimal                                                                   thickening        +----------+-------+-------+--------+------------------------+-----------------+ ICA Prox  113    39             heterogenous and  irregular                                 +----------+-------+-------+--------+------------------------+-----------------+ ICA Mid   150    45     40-59%  heterogenous            turbulent         +----------+-------+-------+--------+------------------------+-----------------+ ICA Distal70     35                                     tortuous          +----------+-------+-------+--------+------------------------+-----------------+ ECA       42     9                                      intimal                                                                   thickening        +----------+-------+-------+--------+------------------------+-----------------+ +----------+--------+--------+----------------+-------------------+           PSV cm/sEDV cm/sDescribe        Arm Pressure (mmHG) +----------+--------+--------+----------------+-------------------+ [id number]              Multiphasic, [id number]                 +----------+--------+--------+----------------+-------------------+ +---------+--------+--+--------+--+---------+ VertebralPSV cm/s30EDV cm/s16Antegrade +---------+--------+--+--------+--+---------+   Summary: Right Carotid: Velocities in the right ICA are consistent with a 1-39% stenosis.                Non-hemodynamically significant plaque <50% noted in the CCA.                Prominent subclavian artery, measuring 1.7 cm AP x 1.7 cm TRV.                Normal flow velocities in the innominate. 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               Non-hemodynamically significant plaque <50% noted in the CCA. Vertebrals:  Left vertebral artery demonstrates antegrade flow. Right vertebral              artery demonstrates an occlusion. 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 Normal flow              hemodynamics were seen in the left subclavian artery. *See table(s) above for measurements and observations. Suggest follow up study in 12 months. Electronically signed by Dina Rich MD on 12/28/2020 at 9:29:06 AM.    Final     Assessment & Plan:   There are no diagnoses linked to this encounter.   No orders of the defined types were placed in this encounter.    Follow-up: No follow-ups on file.  Sonda Primes, MD

## 2021-04-03 NOTE — Assessment & Plan Note (Signed)
Cont w/Benazepril HCT, Norvasc 10

## 2021-04-03 NOTE — Assessment & Plan Note (Signed)
Cont w/ Metformin, glipizide and Actos

## 2021-04-03 NOTE — Assessment & Plan Note (Signed)
Chronic pain Tramadol prn  Potential benefits of a long term opioids use as well as potential risks (i.e. addiction risk, apnea etc) and complications (i.e. Somnolence, constipation and others) were explained to the patient and were aknowledged.

## 2021-04-03 NOTE — Assessment & Plan Note (Signed)
Use prn Claritin

## 2021-04-04 ENCOUNTER — Ambulatory Visit: Payer: Medicare Other | Admitting: Podiatry

## 2021-04-12 ENCOUNTER — Telehealth: Payer: Self-pay | Admitting: Internal Medicine

## 2021-04-12 NOTE — Telephone Encounter (Signed)
LVM for pt ot rtn my call to schedule AWV with [facility]. Please schedule this appt if pt calls the office.  

## 2021-07-05 ENCOUNTER — Ambulatory Visit: Payer: Medicare Other | Admitting: Internal Medicine

## 2021-07-09 ENCOUNTER — Other Ambulatory Visit: Payer: Self-pay | Admitting: Internal Medicine

## 2021-07-09 ENCOUNTER — Other Ambulatory Visit: Payer: Self-pay

## 2021-07-09 ENCOUNTER — Other Ambulatory Visit (INDEPENDENT_AMBULATORY_CARE_PROVIDER_SITE_OTHER): Payer: Medicare Other

## 2021-07-09 DIAGNOSIS — E1159 Type 2 diabetes mellitus with other circulatory complications: Secondary | ICD-10-CM

## 2021-07-09 LAB — COMPREHENSIVE METABOLIC PANEL
ALT: 19 U/L (ref 0–53)
AST: 21 U/L (ref 0–37)
Albumin: 3.7 g/dL (ref 3.5–5.2)
Alkaline Phosphatase: 62 U/L (ref 39–117)
BUN: 19 mg/dL (ref 6–23)
CO2: 28 mEq/L (ref 19–32)
Calcium: 9.3 mg/dL (ref 8.4–10.5)
Chloride: 99 mEq/L (ref 96–112)
Creatinine, Ser: 0.87 mg/dL (ref 0.40–1.50)
GFR: 81.84 mL/min (ref >60.00)
Glucose, Bld: 124 mg/dL — ABNORMAL HIGH (ref 70–99)
Potassium: 3.5 mEq/L (ref 3.5–5.1)
Sodium: 137 mEq/L (ref 135–145)
Total Bilirubin: 0.4 mg/dL (ref 0.2–1.2)
Total Protein: 6.5 g/dL (ref 6.0–8.3)

## 2021-07-09 LAB — HEMOGLOBIN A1C: Hgb A1c MFr Bld: 7.1 % — ABNORMAL HIGH (ref 4.6–6.5)

## 2021-07-12 ENCOUNTER — Encounter: Payer: Self-pay | Admitting: Internal Medicine

## 2021-07-12 ENCOUNTER — Ambulatory Visit (INDEPENDENT_AMBULATORY_CARE_PROVIDER_SITE_OTHER): Payer: Medicare Other | Admitting: Internal Medicine

## 2021-07-12 ENCOUNTER — Other Ambulatory Visit: Payer: Self-pay

## 2021-07-12 DIAGNOSIS — M19079 Primary osteoarthritis, unspecified ankle and foot: Secondary | ICD-10-CM

## 2021-07-12 DIAGNOSIS — I6523 Occlusion and stenosis of bilateral carotid arteries: Secondary | ICD-10-CM | POA: Diagnosis not present

## 2021-07-12 DIAGNOSIS — I10 Essential (primary) hypertension: Secondary | ICD-10-CM | POA: Diagnosis not present

## 2021-07-12 DIAGNOSIS — E785 Hyperlipidemia, unspecified: Secondary | ICD-10-CM | POA: Diagnosis not present

## 2021-07-12 DIAGNOSIS — E1159 Type 2 diabetes mellitus with other circulatory complications: Secondary | ICD-10-CM

## 2021-07-12 MED ORDER — AMLODIPINE BESYLATE 10 MG PO TABS: 10.0000 mg | ORAL_TABLET | Freq: Every day | ORAL | 3 refills | Status: DC

## 2021-07-12 MED ORDER — DICLOFENAC SODIUM 1 % EX GEL: 1.0000 "application " | Freq: Four times a day (QID) | CUTANEOUS | 3 refills | Status: DC

## 2021-07-12 MED ORDER — METFORMIN HCL 1000 MG PO TABS: ORAL_TABLET | ORAL | 3 refills | Status: DC

## 2021-07-12 MED ORDER — BENAZEPRIL-HYDROCHLOROTHIAZIDE 20-12.5 MG PO TABS: 2.0000 | ORAL_TABLET | Freq: Every day | ORAL | 3 refills | Status: DC

## 2021-07-12 MED ORDER — OMEPRAZOLE 20 MG PO CPDR: DELAYED_RELEASE_CAPSULE | ORAL | 3 refills | Status: DC

## 2021-07-12 MED ORDER — GLIPIZIDE ER 10 MG PO TB24: 10.0000 mg | ORAL_TABLET | Freq: Two times a day (BID) | ORAL | 1 refills | Status: DC

## 2021-07-12 MED ORDER — POTASSIUM CHLORIDE CRYS ER 20 MEQ PO TBCR: 20.0000 meq | EXTENDED_RELEASE_TABLET | Freq: Every day | ORAL | 5 refills | Status: DC

## 2021-07-12 MED ORDER — PIOGLITAZONE HCL 45 MG PO TABS: 45.0000 mg | ORAL_TABLET | Freq: Every day | ORAL | 1 refills | Status: DC

## 2021-07-12 MED ORDER — PRAVASTATIN SODIUM 20 MG PO TABS: 20.0000 mg | ORAL_TABLET | Freq: Every day | ORAL | 3 refills | Status: DC

## 2021-07-12 MED ORDER — TRAMADOL HCL 50 MG PO TABS: 50.0000 mg | ORAL_TABLET | Freq: Three times a day (TID) | ORAL | 5 refills | Status: DC | PRN

## 2021-07-12 NOTE — Assessment & Plan Note (Signed)
Cont w/Benazepril HCT, Norvasc 10 mg/d 

## 2021-07-12 NOTE — Assessment & Plan Note (Signed)
Cont w/ Metformin, glipizide and Actos Monitor A1c

## 2021-07-12 NOTE — Assessment & Plan Note (Signed)
Cont on Tramadol prn  Potential benefits of a long term opioids use as well as potential risks (i.e. addiction risk, apnea etc) and complications (i.e. Somnolence, constipation and others) were explained to the patient and were aknowledged. 

## 2021-07-12 NOTE — Assessment & Plan Note (Signed)
On Pravastatin 

## 2021-07-12 NOTE — Progress Notes (Signed)
Subjective:  Patient ID: Kevin Weaver, male    DOB: 1940/12/27  Age: 80 y.o. MRN: [id number]  CC: Follow-up (3 month f/u)   HPI Kevin Weaver presents for DM, HTN, GERD  Outpatient Medications Prior to Visit  Medication Sig Dispense Refill   amLODipine (NORVASC) 10 MG tablet Take 1 tablet (10 mg total) by mouth daily. 90 tablet 3   aspirin EC 81 MG tablet Take 1 tablet (81 mg total) by mouth daily. 100 tablet 3   benazepril-hydrochlorthiazide (LOTENSIN HCT) 20-12.5 MG tablet Take 2 tablets by mouth daily. 180 tablet 3   Cholecalciferol (VITAMIN D3) 50 MCG (2000 UT) capsule Take 1 capsule (2,000 Units total) by mouth daily. 100 capsule 3   glipiZIDE (GLUCOTROL XL) 10 MG 24 hr tablet Take 1 tablet (10 mg total) by mouth 2 (two) times daily. 180 tablet 1   KLOR-CON M20 20 MEQ tablet TAKE 1 TABLET BY MOUTH EVERY DAY 30 tablet 5   metFORMIN (GLUCOPHAGE) 1000 MG tablet TAKE 1 TABLET BY MOUTH TWICE A DAY WITH A MEAL 180 tablet 3   omeprazole (PRILOSEC) 20 MG capsule TAKE 1 CAPSULE BY MOUTH EVERY DAY 90 capsule 3   pioglitazone (ACTOS) 45 MG tablet Take 1 tablet (45 mg total) by mouth daily. 90 tablet 1   pravastatin (PRAVACHOL) 20 MG tablet Take 1 tablet (20 mg total) by mouth daily. 90 tablet 3   traMADol (ULTRAM) 50 MG tablet Take 1 tablet (50 mg total) by mouth every 8 (eight) hours as needed for severe pain. 90 tablet 2   No facility-administered medications prior to visit.    ROS: Review of Systems  Constitutional:  Negative for appetite change, fatigue and unexpected weight change.  HENT:  Positive for congestion and rhinorrhea. Negative for nosebleeds, sneezing, sore throat and trouble swallowing.   Eyes:  Negative for itching and visual disturbance.  Respiratory:  Negative for cough.   Cardiovascular:  Negative for chest pain, palpitations and leg swelling.  Gastrointestinal:  Negative for abdominal distention, blood in stool, diarrhea and nausea.  Genitourinary:  Negative  for frequency and hematuria.  Musculoskeletal:  Positive for arthralgias and gait problem. Negative for back pain, joint swelling and neck pain.  Skin:  Negative for rash.  Neurological:  Negative for dizziness, tremors, speech difficulty and weakness.  Psychiatric/Behavioral:  Negative for agitation, dysphoric mood, sleep disturbance and suicidal ideas. The patient is not nervous/anxious.    Objective:  BP 128/80 (BP Location: Left Arm)   Temp 97.6 F (36.4 C)   Ht 5\' 6"  (1.676 m)   Wt 213 lb (96.6 kg)   BMI 34.38 kg/m   BP Readings from Last 3 Encounters:  07/12/21 128/80  04/03/21 122/62  01/30/21 132/70    Wt Readings from Last 3 Encounters:  07/12/21 213 lb (96.6 kg)  04/03/21 212 lb (96.2 kg)  01/30/21 211 lb (95.7 kg)    Physical Exam Constitutional:      General: He is not in acute distress.    Appearance: He is well-developed. He is obese.     Comments: NAD  Eyes:     Conjunctiva/sclera: Conjunctivae normal.     Pupils: Pupils are equal, round, and reactive to light.  Neck:     Thyroid: No thyromegaly.     Vascular: No JVD.  Cardiovascular:     Rate and Rhythm: Normal rate and regular rhythm.     Heart sounds: Normal heart sounds. No murmur heard.   No friction  rub. No gallop.  Pulmonary:     Effort: Pulmonary effort is normal. No respiratory distress.     Breath sounds: Normal breath sounds. No wheezing or rales.  Chest:     Chest wall: No tenderness.  Abdominal:     General: Bowel sounds are normal. There is no distension.     Palpations: Abdomen is soft. There is no mass.     Tenderness: There is no abdominal tenderness. There is no guarding or rebound.  Musculoskeletal:        General: Tenderness present. Normal range of motion.     Cervical back: Normal range of motion.  Lymphadenopathy:     Cervical: No cervical adenopathy.  Skin:    General: Skin is warm and dry.     Findings: No rash.  Neurological:     Mental Status: He is alert and  oriented to person, place, and time.     Cranial Nerves: No cranial nerve deficit.     Motor: No weakness or abnormal muscle tone.     Coordination: Coordination abnormal.     Gait: Gait abnormal.     Deep Tendon Reflexes: Reflexes are normal and symmetric.  Psychiatric:        Behavior: Behavior normal.        Thought Content: Thought content normal.        Judgment: Judgment normal.  Antalgic gait, arthritic deformities, post - trauma deformities R hand  Lab Results  Component Value Date   WBC 6.6 12/18/2020   HGB 11.3 (L) 12/18/2020   HCT 33.8 (L) 12/18/2020   PLT 455.0 (H) 12/18/2020   GLUCOSE 124 (H) 07/09/2021   CHOL 124 03/30/2021   TRIG 63.0 03/30/2021   HDL 45.10 03/30/2021   LDLCALC 66 03/30/2021   ALT 19 07/09/2021   AST 21 07/09/2021   NA 137 07/09/2021   K 3.5 07/09/2021   CL 99 07/09/2021   CREATININE 0.87 07/09/2021   BUN 19 07/09/2021   CO2 28 07/09/2021   TSH 1.82 12/18/2020   PSA 0.41 12/18/2020   HGBA1C 7.1 (H) 07/09/2021   MICROALBUR <0.7 12/18/2020    VAS US CAROTID  Result Date: 12/28/2020 Carotid Arterial Duplex Study Indications:       Left bruit and patient denies any cerebrovascular symptoms. Risk Factors:      Hypertension, hyperlipidemia, Diabetes, no history of                    smoking. Passive smoke exposure. Comparison Study:  NA Performing Technologist: Tyna Jaksch RVT  Examination Guidelines: A complete evaluation includes B-mode imaging, spectral Doppler, color Doppler, and power Doppler as needed of all accessible portions of each vessel. Bilateral testing is considered an integral part of a complete examination. Limited examinations for reoccurring indications may be performed as noted.  Right Carotid Findings: +----------+--------+--------+--------+------------------+--------+           PSV cm/sEDV cm/sStenosisPlaque DescriptionComments +----------+--------+--------+--------+------------------+--------+ CCA Prox  69      20                                          +----------+--------+--------+--------+------------------+--------+ CCA Distal44      16      <50%    heterogenous               +----------+--------+--------+--------+------------------+--------+ ICA Prox  37      14  heterogenous               +----------+--------+--------+--------+------------------+--------+ ICA Mid   47      21      1-39%   heterogenous               +----------+--------+--------+--------+------------------+--------+ ICA Distal80      39                                         +----------+--------+--------+--------+------------------+--------+ ECA       52      11              heterogenous               +----------+--------+--------+--------+------------------+--------+ +----------+--------+-------+---------+-------------------+           PSV cm/sEDV cmsDescribe Arm Pressure (mmHG) +----------+--------+-------+---------+-------------------+ [id number]             Turbulent140                 +----------+--------+-------+---------+-------------------+ +---------+--------+-+--------+-+------+ VertebralPSV cm/s0EDV cm/s0Absent +---------+--------+-+--------+-+------+ Prominent subclavian artery, measuring 1.7 cm AP x 1.7 cm TRV. Normal flow velocities in the innominate. Left Carotid Findings: +----------+-------+-------+--------+------------------------+-----------------+           PSV    EDV    StenosisPlaque Description      Comments                    cm/s   cm/s                                                     +----------+-------+-------+--------+------------------------+-----------------+ CCA Prox  47     16             heterogenous                              +----------+-------+-------+--------+------------------------+-----------------+ CCA Mid   50     18     <50%    heterogenous            intimal                                                                    thickening        +----------+-------+-------+--------+------------------------+-----------------+ CCA Distal37     10             heterogenous            intimal                                                                   thickening        +----------+-------+-------+--------+------------------------+-----------------+ ICA Prox  113    39  heterogenous and                                                          irregular                                 +----------+-------+-------+--------+------------------------+-----------------+ ICA Mid   150    45     40-59%  heterogenous            turbulent         +----------+-------+-------+--------+------------------------+-----------------+ ICA Distal70     35                                     tortuous          +----------+-------+-------+--------+------------------------+-----------------+ ECA       42     9                                      intimal                                                                   thickening        +----------+-------+-------+--------+------------------------+-----------------+ +----------+--------+--------+----------------+-------------------+           PSV cm/sEDV cm/sDescribe        Arm Pressure (mmHG) +----------+--------+--------+----------------+-------------------+ [id number]              Multiphasic, [id number]                 +----------+--------+--------+----------------+-------------------+ +---------+--------+--+--------+--+---------+ VertebralPSV cm/s30EDV cm/s16Antegrade +---------+--------+--+--------+--+---------+   Summary: Right Carotid: Velocities in the right ICA are consistent with a 1-39% stenosis.                Non-hemodynamically significant plaque <50% noted in the CCA.                Prominent subclavian artery, measuring 1.7 cm AP x 1.7 cm TRV.                Normal flow velocities in the  innominate. 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               Non-hemodynamically significant plaque <50% noted in the CCA. Vertebrals:  Left vertebral artery demonstrates antegrade flow. Right vertebral              artery demonstrates an occlusion. 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 Normal flow              hemodynamics were seen in the left subclavian artery. *See table(s) above for measurements and observations. Suggest follow up study in 12 months. Electronically signed by Dina Rich MD on 12/28/2020 at 9:29:06 AM.    Final     Assessment & Plan:   Problem List Items Addressed This Visit   None  Follow-up: No follow-ups on file.  Walker Kehr, MD

## 2021-07-25 ENCOUNTER — Ambulatory Visit (INDEPENDENT_AMBULATORY_CARE_PROVIDER_SITE_OTHER): Payer: Medicare Other | Admitting: Orthopaedic Surgery

## 2021-07-25 ENCOUNTER — Encounter: Payer: Self-pay | Admitting: Orthopaedic Surgery

## 2021-07-25 DIAGNOSIS — I6523 Occlusion and stenosis of bilateral carotid arteries: Secondary | ICD-10-CM | POA: Diagnosis not present

## 2021-07-25 DIAGNOSIS — M25511 Pain in right shoulder: Secondary | ICD-10-CM

## 2021-07-25 DIAGNOSIS — M19011 Primary osteoarthritis, right shoulder: Secondary | ICD-10-CM | POA: Diagnosis not present

## 2021-07-25 DIAGNOSIS — G8929 Other chronic pain: Secondary | ICD-10-CM | POA: Diagnosis not present
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
## 2021-10-12 ENCOUNTER — Other Ambulatory Visit (INDEPENDENT_AMBULATORY_CARE_PROVIDER_SITE_OTHER): Payer: Medicare Other

## 2021-10-12 ENCOUNTER — Ambulatory Visit (INDEPENDENT_AMBULATORY_CARE_PROVIDER_SITE_OTHER): Payer: Medicare Other

## 2021-10-12 ENCOUNTER — Other Ambulatory Visit: Payer: Self-pay

## 2021-10-12 ENCOUNTER — Other Ambulatory Visit: Payer: Self-pay | Admitting: *Deleted

## 2021-10-12 VITALS — BP 140/80 | Temp 98.2°F | Ht 66.0 in | Wt 220.4 lb

## 2021-10-12 DIAGNOSIS — E785 Hyperlipidemia, unspecified: Secondary | ICD-10-CM | POA: Diagnosis not present

## 2021-10-12 DIAGNOSIS — E1159 Type 2 diabetes mellitus with other circulatory complications: Secondary | ICD-10-CM | POA: Diagnosis not present

## 2021-10-12 DIAGNOSIS — Z0000 Encounter for general adult medical examination without abnormal findings: Secondary | ICD-10-CM | POA: Diagnosis not present

## 2021-10-12 DIAGNOSIS — I10 Essential (primary) hypertension: Secondary | ICD-10-CM

## 2021-10-12 LAB — LIPID PANEL
Cholesterol: 169 mg/dL (ref 0–200)
HDL: 53 mg/dL (ref >39.00)
LDL Cholesterol: 100 mg/dL — ABNORMAL HIGH (ref 0–99)
NonHDL: 116.22
Total CHOL/HDL Ratio: 3
Triglycerides: 82 mg/dL (ref 0.0–149.0)
VLDL: 16.4 mg/dL (ref 0.0–40.0)

## 2021-10-12 LAB — HEMOGLOBIN A1C: Hgb A1c MFr Bld: 7.7 % — ABNORMAL HIGH (ref 4.6–6.5)

## 2021-10-12 MED ORDER — PRAVASTATIN SODIUM 20 MG PO TABS: 20.0000 mg | ORAL_TABLET | Freq: Every day | ORAL | 2 refills | Status: DC

## 2021-10-12 MED ORDER — PIOGLITAZONE HCL 45 MG PO TABS: 45.0000 mg | ORAL_TABLET | Freq: Every day | ORAL | 2 refills | Status: DC

## 2021-10-12 MED ORDER — METFORMIN HCL 1000 MG PO TABS: ORAL_TABLET | ORAL | 2 refills | Status: DC

## 2021-10-12 MED ORDER — BENAZEPRIL-HYDROCHLOROTHIAZIDE 20-12.5 MG PO TABS: 2.0000 | ORAL_TABLET | Freq: Every day | ORAL | 2 refills | Status: DC

## 2021-10-12 MED ORDER — OMEPRAZOLE 20 MG PO CPDR: DELAYED_RELEASE_CAPSULE | ORAL | 2 refills | Status: DC

## 2021-10-12 MED ORDER — AMLODIPINE BESYLATE 10 MG PO TABS: 10.0000 mg | ORAL_TABLET | Freq: Every day | ORAL | 2 refills | Status: DC

## 2021-10-12 MED ORDER — POTASSIUM CHLORIDE CRYS ER 20 MEQ PO TBCR: 20.0000 meq | EXTENDED_RELEASE_TABLET | Freq: Every day | ORAL | 2 refills | Status: DC
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
## 2021-10-12 NOTE — Telephone Encounter (Signed)
Pt came in to have his medicare wellness. Requesting refills on all his medication except for diclofenac. Sent refills to [facility]. Pt has appt on 12/19 will refill narcotics at that time...Shearon Stalls

## 2021-10-12 NOTE — Patient Instructions (Signed)
Mr. Kevin Weaver , Thank you for taking time to come for your Medicare Wellness Visit. I appreciate your ongoing commitment to your health goals. Please review the following plan we discussed and let me know if I can assist you in the future.   Screening recommendations/referrals: Colonoscopy: Not a candidate for screening due to age Recommended yearly ophthalmology/optometry visit for glaucoma screening and checkup Recommended yearly dental visit for hygiene and checkup  Vaccinations: Influenza vaccine: declined Pneumococcal vaccine: 08/10/2009; declined Tdap vaccine: 01/04/2015; due every 10 years Shingles vaccine: declined   Covid-19: declined  Advanced directives: Advance directive discussed with you today. I have provided a copy for you to complete at home and have notarized. Once this is complete please bring a copy in to our office so we can scan it into your chart.  Conditions/risks identified: Yes; Client understands the importance of follow-up with providers by attending scheduled visits and discussed goals to eat healthier, increase physical activity, exercise the brain, socialize more, get enough sleep and make time for laughter.  Next appointment: Please schedule your next Medicare Wellness Visit with your Nurse Health Advisor in 1 year by calling [phone].  Preventive Care 80 Years and Older, Male Preventive care refers to lifestyle choices and visits with your health care provider that can promote health and wellness. What does preventive care include? A yearly physical exam. This is also called an annual well check. Dental exams once or twice a year. Routine eye exams. Ask your health care provider how often you should have your eyes checked. Personal lifestyle choices, including: Daily care of your teeth and gums. Regular physical activity. Eating a healthy diet. Avoiding tobacco and drug use. Limiting alcohol use. Practicing safe sex. Taking low doses of aspirin every  day. Taking vitamin and mineral supplements as recommended by your health care provider. What happens during an annual well check? The services and screenings done by your health care provider during your annual well check will depend on your age, overall health, lifestyle risk factors, and family history of disease. Counseling  Your health care provider may ask you questions about your: Alcohol use. Tobacco use. Drug use. Emotional well-being. Home and relationship well-being. Sexual activity. Eating habits. History of falls. Memory and ability to understand (cognition). Work and work Astronomer. Screening  You may have the following tests or measurements: Height, weight, and BMI. Blood pressure. Lipid and cholesterol levels. These may be checked every 5 years, or more frequently if you are over 71 years old. Skin check. Lung cancer screening. You may have this screening every year starting at age 10 if you have a 30-pack-year history of smoking and currently smoke or have quit within the past 15 years. Fecal occult blood test (FOBT) of the stool. You may have this test every year starting at age 75. Flexible sigmoidoscopy or colonoscopy. You may have a sigmoidoscopy every 5 years or a colonoscopy every 10 years starting at age 80. Prostate cancer screening. Recommendations will vary depending on your family history and other risks. Hepatitis C blood test. Hepatitis B blood test. Sexually transmitted disease (STD) testing. Diabetes screening. This is done by checking your blood sugar (glucose) after you have not eaten for a while (fasting). You may have this done every 1-3 years. Abdominal aortic aneurysm (AAA) screening. You may need this if you are a current or former smoker. Osteoporosis. You may be screened starting at age 105 if you are at high risk. Talk with your health care provider about your  test results, treatment options, and if necessary, the need for more  tests. Vaccines  Your health care provider may recommend certain vaccines, such as: Influenza vaccine. This is recommended every year. Tetanus, diphtheria, and acellular pertussis (Tdap, Td) vaccine. You may need a Td booster every 10 years. Zoster vaccine. You may need this after age 5. Pneumococcal 13-valent conjugate (PCV13) vaccine. One dose is recommended after age 43. Pneumococcal polysaccharide (PPSV23) vaccine. One dose is recommended after age 25. Talk to your health care provider about which screenings and vaccines you need and how often you need them. This information is not intended to replace advice given to you by your health care provider. Make sure you discuss any questions you have with your health care provider. Document Released: 11/10/2015 Document Revised: 07/03/2016 Document Reviewed: 08/15/2015 Elsevier Interactive Patient Education  2017 [place] Prevention in the Home Falls can cause injuries. They can happen to people of all ages. There are many things you can do to make your home safe and to help prevent falls. What can I do on the outside of my home? Regularly fix the edges of walkways and driveways and fix any cracks. Remove anything that might make you trip as you walk through a door, such as a raised step or threshold. Trim any bushes or trees on the path to your home. Use bright outdoor lighting. Clear any walking paths of anything that might make someone trip, such as rocks or tools. Regularly check to see if handrails are loose or broken. Make sure that both sides of any steps have handrails. Any raised decks and porches should have guardrails on the edges. Have any leaves, snow, or ice cleared regularly. Use sand or salt on walking paths during winter. Clean up any spills in your garage right away. This includes oil or grease spills. What can I do in the bathroom? Use night lights. Install grab bars by the toilet and in the tub and shower.  Do not use towel bars as grab bars. Use non-skid mats or decals in the tub or shower. If you need to sit down in the shower, use a plastic, non-slip stool. Keep the floor dry. Clean up any water that spills on the floor as soon as it happens. Remove soap buildup in the tub or shower regularly. Attach bath mats securely with double-sided non-slip rug tape. Do not have throw rugs and other things on the floor that can make you trip. What can I do in the bedroom? Use night lights. Make sure that you have a light by your bed that is easy to reach. Do not use any sheets or blankets that are too big for your bed. They should not hang down onto the floor. Have a firm chair that has side arms. You can use this for support while you get dressed. Do not have throw rugs and other things on the floor that can make you trip. What can I do in the kitchen? Clean up any spills right away. Avoid walking on wet floors. Keep items that you use a lot in easy-to-reach places. If you need to reach something above you, use a strong step stool that has a grab bar. Keep electrical cords out of the way. Do not use floor polish or wax that makes floors slippery. If you must use wax, use non-skid floor wax. Do not have throw rugs and other things on the floor that can make you trip. What can I do with my  stairs? Do not leave any items on the stairs. Make sure that there are handrails on both sides of the stairs and use them. Fix handrails that are broken or loose. Make sure that handrails are as long as the stairways. Check any carpeting to make sure that it is firmly attached to the stairs. Fix any carpet that is loose or worn. Avoid having throw rugs at the top or bottom of the stairs. If you do have throw rugs, attach them to the floor with carpet tape. Make sure that you have a light switch at the top of the stairs and the bottom of the stairs. If you do not have them, ask someone to add them for you. What else  can I do to help prevent falls? Wear shoes that: Do not have high heels. Have rubber bottoms. Are comfortable and fit you well. Are closed at the toe. Do not wear sandals. If you use a stepladder: Make sure that it is fully opened. Do not climb a closed stepladder. Make sure that both sides of the stepladder are locked into place. Ask someone to hold it for you, if possible. Clearly mark and make sure that you can see: Any grab bars or handrails. First and last steps. Where the edge of each step is. Use tools that help you move around (mobility aids) if they are needed. These include: Canes. Walkers. Scooters. Crutches. Turn on the lights when you go into a dark area. Replace any light bulbs as soon as they burn out. Set up your furniture so you have a clear path. Avoid moving your furniture around. If any of your floors are uneven, fix them. If there are any pets around you, be aware of where they are. Review your medicines with your doctor. Some medicines can make you feel dizzy. This can increase your chance of falling. Ask your doctor what other things that you can do to help prevent falls. This information is not intended to replace advice given to you by your health care provider. Make sure you discuss any questions you have with your health care provider. Document Released: 08/10/2009 Document Revised: 03/21/2016 Document Reviewed: 11/18/2014 Elsevier Interactive Patient Education  2017 [employer].

## 2021-10-15 ENCOUNTER — Ambulatory Visit (INDEPENDENT_AMBULATORY_CARE_PROVIDER_SITE_OTHER): Payer: Medicare Other | Admitting: Internal Medicine

## 2021-10-15 ENCOUNTER — Encounter: Payer: Self-pay | Admitting: Internal Medicine

## 2021-10-15 ENCOUNTER — Other Ambulatory Visit: Payer: Self-pay

## 2021-10-15 DIAGNOSIS — E785 Hyperlipidemia, unspecified: Secondary | ICD-10-CM

## 2021-10-15 DIAGNOSIS — I6523 Occlusion and stenosis of bilateral carotid arteries: Secondary | ICD-10-CM

## 2021-10-15 DIAGNOSIS — M19079 Primary osteoarthritis, unspecified ankle and foot: Secondary | ICD-10-CM | POA: Diagnosis not present

## 2021-10-15 DIAGNOSIS — E1159 Type 2 diabetes mellitus with other circulatory complications: Secondary | ICD-10-CM

## 2021-10-15 DIAGNOSIS — I10 Essential (primary) hypertension: Secondary | ICD-10-CM

## 2021-10-15 MED ORDER — METFORMIN HCL 1000 MG PO TABS: ORAL_TABLET | ORAL | 3 refills | Status: DC

## 2021-10-15 NOTE — Assessment & Plan Note (Signed)
Chronic pain Cont on Tramadol prn  Potential benefits of a long term opioids use as well as potential risks (i.e. addiction risk, apnea etc) and complications (i.e. Somnolence, constipation and others) were explained to the patient and were aknowledged.

## 2021-10-15 NOTE — Assessment & Plan Note (Signed)
Cont w/krill oil  Pravastatin if tolerated

## 2021-10-15 NOTE — Progress Notes (Signed)
Subjective:  Patient ID: Kevin Weaver, male    DOB: 06-29-1941  Age: 80 y.o. MRN: [id number]  CC: Follow-up (3 month f/u)   HPI KEKOA DISBROW presents for HTN, DM, GERD F/u on R foot pain  Outpatient Medications Prior to Visit  Medication Sig Dispense Refill   amLODipine (NORVASC) 10 MG tablet Take 1 tablet (10 mg total) by mouth daily. 90 tablet 2   aspirin EC 81 MG tablet Take 1 tablet (81 mg total) by mouth daily. 100 tablet 3   benazepril-hydrochlorthiazide (LOTENSIN HCT) 20-12.5 MG tablet Take 2 tablets by mouth daily. 180 tablet 2   Cholecalciferol (VITAMIN D3) 50 MCG (2000 UT) capsule Take 1 capsule (2,000 Units total) by mouth daily. 100 capsule 3   diclofenac Sodium (VOLTAREN) 1 % GEL Apply 1 application topically 4 (four) times daily. Apply to painful joints 100 g 3   glipiZIDE (GLUCOTROL XL) 10 MG 24 hr tablet Take 1 tablet (10 mg total) by mouth 2 (two) times daily. 180 tablet 1   omeprazole (PRILOSEC) 20 MG capsule TAKE 1 CAPSULE BY MOUTH EVERY DAY 90 capsule 2   pioglitazone (ACTOS) 45 MG tablet Take 1 tablet (45 mg total) by mouth daily. 90 tablet 2   potassium chloride SA (KLOR-CON M20) 20 MEQ tablet Take 1 tablet (20 mEq total) by mouth daily. 90 tablet 2   pravastatin (PRAVACHOL) 20 MG tablet Take 1 tablet (20 mg total) by mouth daily. 90 tablet 2   traMADol (ULTRAM) 50 MG tablet Take 1 tablet (50 mg total) by mouth every 8 (eight) hours as needed for severe pain. 90 tablet 5   metFORMIN (GLUCOPHAGE) 1000 MG tablet TAKE 1 TABLET BY MOUTH TWICE A DAY WITH A MEAL 180 tablet 2   No facility-administered medications prior to visit.    ROS: Review of Systems  Constitutional:  Negative for appetite change, fatigue and unexpected weight change.  HENT:  Negative for congestion, nosebleeds, sneezing, sore throat and trouble swallowing.   Eyes:  Negative for itching and visual disturbance.  Respiratory:  Negative for cough.   Cardiovascular:  Negative for chest pain,  palpitations and leg swelling.  Gastrointestinal:  Negative for abdominal distention, blood in stool, diarrhea and nausea.  Genitourinary:  Negative for frequency and hematuria.  Musculoskeletal:  Positive for arthralgias and gait problem. Negative for back pain, joint swelling and neck pain.  Skin:  Negative for rash.  Neurological:  Negative for dizziness, tremors, speech difficulty and weakness.  Psychiatric/Behavioral:  Negative for agitation, dysphoric mood, sleep disturbance and suicidal ideas. The patient is not nervous/anxious.    Objective:  BP 120/82 (BP Location: Left Arm)    Pulse 90    Temp 97.8 F (36.6 C) (Oral)    Ht 5\' 6"  (1.676 m)    Wt 223 lb 3.2 oz (101.2 kg)    SpO2 99%    BMI 36.03 kg/m   BP Readings from Last 3 Encounters:  10/15/21 120/82  10/12/21 140/80  07/12/21 128/80    Wt Readings from Last 3 Encounters:  10/15/21 223 lb 3.2 oz (101.2 kg)  10/12/21 220 lb 6.4 oz (100 kg)  07/12/21 213 lb (96.6 kg)    Physical Exam Constitutional:      General: He is not in acute distress.    Appearance: He is well-developed. He is obese.     Comments: NAD  Eyes:     Conjunctiva/sclera: Conjunctivae normal.     Pupils: Pupils are equal, round,  and reactive to light.  Neck:     Thyroid: No thyromegaly.     Vascular: No JVD.  Cardiovascular:     Rate and Rhythm: Normal rate and regular rhythm.     Heart sounds: Normal heart sounds. No murmur heard.   No friction rub. No gallop.  Pulmonary:     Effort: Pulmonary effort is normal. No respiratory distress.     Breath sounds: Normal breath sounds. No wheezing or rales.  Chest:     Chest wall: No tenderness.  Abdominal:     General: Bowel sounds are normal. There is no distension.     Palpations: Abdomen is soft. There is no mass.     Tenderness: There is no abdominal tenderness. There is no guarding or rebound.  Musculoskeletal:        General: No tenderness. Normal range of motion.     Cervical back: Normal  range of motion.  Lymphadenopathy:     Cervical: No cervical adenopathy.  Skin:    General: Skin is warm and dry.     Findings: No rash.  Neurological:     Mental Status: He is alert and oriented to person, place, and time.     Cranial Nerves: No cranial nerve deficit.     Motor: No abnormal muscle tone.     Coordination: Coordination abnormal.     Gait: Gait abnormal.     Deep Tendon Reflexes: Reflexes are normal and symmetric.  Psychiatric:        Behavior: Behavior normal.        Thought Content: Thought content normal.        Judgment: Judgment normal.  R hand fingers missing Feet w/deformities R>>L   Lab Results  Component Value Date   WBC 6.6 12/18/2020   HGB 11.3 (L) 12/18/2020   HCT 33.8 (L) 12/18/2020   PLT 455.0 (H) 12/18/2020   GLUCOSE 124 (H) 07/09/2021   CHOL 169 10/12/2021   TRIG 82.0 10/12/2021   HDL 53.00 10/12/2021   LDLCALC 100 (H) 10/12/2021   ALT 19 07/09/2021   AST 21 07/09/2021   NA 137 07/09/2021   K 3.5 07/09/2021   CL 99 07/09/2021   CREATININE 0.87 07/09/2021   BUN 19 07/09/2021   CO2 28 07/09/2021   TSH 1.82 12/18/2020   PSA 0.41 12/18/2020   HGBA1C 7.7 (H) 10/12/2021   MICROALBUR <0.7 12/18/2020    VAS US CAROTID  Result Date: 12/28/2020 Carotid Arterial Duplex Study Indications:       Left bruit and patient denies any cerebrovascular symptoms. Risk Factors:      Hypertension, hyperlipidemia, Diabetes, no history of                    smoking. Passive smoke exposure. Comparison Study:  NA Performing Technologist: Tyna Jaksch RVT  Examination Guidelines: A complete evaluation includes B-mode imaging, spectral Doppler, color Doppler, and power Doppler as needed of all accessible portions of each vessel. Bilateral testing is considered an integral part of a complete examination. Limited examinations for reoccurring indications may be performed as noted.  Right Carotid Findings:  +----------+--------+--------+--------+------------------+--------+             PSV cm/s EDV cm/s Stenosis Plaque Description Comments  +----------+--------+--------+--------+------------------+--------+  CCA Prox   69       20                                             +----------+--------+--------+--------+------------------+--------+  CCA Distal 44       16       <50%     heterogenous                 +----------+--------+--------+--------+------------------+--------+  ICA Prox   37       14                heterogenous                 +----------+--------+--------+--------+------------------+--------+  ICA Mid    47       21       1-39%    heterogenous                 +----------+--------+--------+--------+------------------+--------+  ICA Distal 80       39                                             +----------+--------+--------+--------+------------------+--------+  ECA        52       11                heterogenous                 +----------+--------+--------+--------+------------------+--------+ +----------+--------+-------+---------+-------------------+             PSV cm/s EDV cms Describe  Arm Pressure (mmHG)  +----------+--------+-------+---------+-------------------+  Subclavian 76               Turbulent 140                  +----------+--------+-------+---------+-------------------+ +---------+--------+-+--------+-+------+  Vertebral PSV cm/s 0 EDV cm/s 0 Absent  +---------+--------+-+--------+-+------+ Prominent subclavian artery, measuring 1.7 cm AP x 1.7 cm TRV. Normal flow velocities in the innominate. Left Carotid Findings: +----------+-------+-------+--------+------------------------+-----------------+             PSV     EDV     Stenosis Plaque Description       Comments                       cm/s    cm/s                                                         +----------+-------+-------+--------+------------------------+-----------------+  CCA Prox   47      16               heterogenous                                 +----------+-------+-------+--------+------------------------+-----------------+  CCA Mid    50      18      <50%     heterogenous             intimal  thickening         +----------+-------+-------+--------+------------------------+-----------------+  CCA Distal 37      10               heterogenous             intimal                                                                          thickening         +----------+-------+-------+--------+------------------------+-----------------+  ICA Prox   113     39               heterogenous and                                                                 irregular                                   +----------+-------+-------+--------+------------------------+-----------------+  ICA Mid    150     45      40-59%   heterogenous             turbulent          +----------+-------+-------+--------+------------------------+-----------------+  ICA Distal 70      35                                        tortuous           +----------+-------+-------+--------+------------------------+-----------------+  ECA        42      9                                         intimal                                                                          thickening         +----------+-------+-------+--------+------------------------+-----------------+ +----------+--------+--------+----------------+-------------------+             PSV cm/s EDV cm/s Describe         Arm Pressure (mmHG)  +----------+--------+--------+----------------+-------------------+  Subclavian 68                Multiphasic, WNL 142                  +----------+--------+--------+----------------+-------------------+ +---------+--------+--+--------+--+---------+  Vertebral PSV cm/s 30 EDV cm/s 16 Antegrade  +---------+--------+--+--------+--+---------+   Summary: Right Carotid: Velocities in the right ICA are consistent  with a  1-39% stenosis.                Non-hemodynamically significant plaque <50% noted in the CCA.                Prominent subclavian artery, measuring 1.7 cm AP x 1.7 cm TRV.                Normal flow velocities in the innominate. 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               Non-hemodynamically significant plaque <50% noted in the CCA. Vertebrals:  Left vertebral artery demonstrates antegrade flow. Right vertebral              artery demonstrates an occlusion. 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 Normal flow              hemodynamics were seen in the left subclavian artery. *See table(s) above for measurements and observations. Suggest follow up study in 12 months. Electronically signed by Carlyle Dolly MD on 12/28/2020 at 9:29:06 AM.    Final     Assessment & Plan:   Problem List Items Addressed This Visit     Arthritis of foot    Chronic pain Cont on Tramadol prn  Potential benefits of a long term opioids use as well as potential risks (i.e. addiction risk, apnea etc) and complications (i.e. Somnolence, constipation and others) were explained to the patient and were aknowledged.      DM (diabetes mellitus) ([place])    Worse Cont w/ Metformin, glipizide and Actos Loose a few lbs Cut back on [employer] liquor       Relevant Medications   metFORMIN (GLUCOPHAGE) 1000 MG tablet   Other Relevant Orders   Comprehensive metabolic panel   Hemoglobin A1c   Dyslipidemia    Cont w/krill oil  Pravastatin if tolerated      HTN (hypertension)    Cont w/Benazepril HCT, Norvasc 10 mg/d         Meds ordered this encounter  Medications   metFORMIN (GLUCOPHAGE) 1000 MG tablet    Sig: TAKE 1 TABLET BY MOUTH TWICE A DAY WITH A MEAL    Dispense:  180 tablet    Refill:  3      Follow-up: Return in about 3 months (around 01/13/2022) for a follow-up visit.  Walker Kehr, MD

## 2021-10-15 NOTE — Assessment & Plan Note (Signed)
Cont w/Benazepril HCT, Norvasc 10 mg/d 

## 2021-10-15 NOTE — Assessment & Plan Note (Addendum)
Worse Cont w/ Metformin, glipizide and Actos Loose a few lbs Cut back on [employer] liquor

## 2021-12-14 DIAGNOSIS — H35033 Hypertensive retinopathy, bilateral: Secondary | ICD-10-CM | POA: Diagnosis not present

## 2021-12-14 DIAGNOSIS — E119 Type 2 diabetes mellitus without complications: Secondary | ICD-10-CM | POA: Diagnosis not present

## 2021-12-14 DIAGNOSIS — Z961 Presence of intraocular lens: Secondary | ICD-10-CM | POA: Diagnosis not present

## 2021-12-14 DIAGNOSIS — H04123 Dry eye syndrome of bilateral lacrimal glands: Secondary | ICD-10-CM | POA: Diagnosis not present

## 2021-12-14 LAB — HM DIABETES EYE EXAM

## 2022-01-05 ENCOUNTER — Other Ambulatory Visit: Payer: Self-pay | Admitting: Internal Medicine

## 2022-01-09 ENCOUNTER — Other Ambulatory Visit: Payer: Self-pay | Admitting: Internal Medicine

## 2022-01-11 ENCOUNTER — Other Ambulatory Visit (INDEPENDENT_AMBULATORY_CARE_PROVIDER_SITE_OTHER): Payer: Medicare Other

## 2022-01-11 DIAGNOSIS — E1159 Type 2 diabetes mellitus with other circulatory complications: Secondary | ICD-10-CM | POA: Diagnosis not present

## 2022-01-11 LAB — COMPREHENSIVE METABOLIC PANEL
ALT: 21 U/L (ref 0–53)
AST: 26 U/L (ref 0–37)
Albumin: 4.1 g/dL (ref 3.5–5.2)
Alkaline Phosphatase: 54 U/L (ref 39–117)
BUN: 25 mg/dL — ABNORMAL HIGH (ref 6–23)
CO2: 28 mEq/L (ref 19–32)
Calcium: 9.8 mg/dL (ref 8.4–10.5)
Chloride: 101 mEq/L (ref 96–112)
Creatinine, Ser: 1.21 mg/dL (ref 0.40–1.50)
GFR: 56.58 mL/min — ABNORMAL LOW (ref >60.00)
Glucose, Bld: 55 mg/dL — ABNORMAL LOW (ref 70–99)
Potassium: 4.3 mEq/L (ref 3.5–5.1)
Sodium: 137 mEq/L (ref 135–145)
Total Bilirubin: 0.4 mg/dL (ref 0.2–1.2)
Total Protein: 6.8 g/dL (ref 6.0–8.3)

## 2022-01-11 LAB — HEMOGLOBIN A1C: Hgb A1c MFr Bld: 6.9 % — ABNORMAL HIGH (ref 4.6–6.5)

## 2022-01-14 ENCOUNTER — Encounter: Payer: Self-pay | Admitting: Internal Medicine

## 2022-01-14 ENCOUNTER — Other Ambulatory Visit: Payer: Self-pay

## 2022-01-14 ENCOUNTER — Ambulatory Visit (INDEPENDENT_AMBULATORY_CARE_PROVIDER_SITE_OTHER): Payer: Medicare Other | Admitting: Internal Medicine

## 2022-01-14 VITALS — BP 136/70 | HR 86 | Temp 98.7°F | Ht 66.0 in | Wt 223.0 lb

## 2022-01-14 DIAGNOSIS — W1800XA Striking against unspecified object with subsequent fall, initial encounter: Secondary | ICD-10-CM | POA: Diagnosis not present

## 2022-01-14 DIAGNOSIS — R011 Cardiac murmur, unspecified: Secondary | ICD-10-CM

## 2022-01-14 DIAGNOSIS — I10 Essential (primary) hypertension: Secondary | ICD-10-CM

## 2022-01-14 DIAGNOSIS — M19079 Primary osteoarthritis, unspecified ankle and foot: Secondary | ICD-10-CM | POA: Diagnosis not present

## 2022-01-14 DIAGNOSIS — I6523 Occlusion and stenosis of bilateral carotid arteries: Secondary | ICD-10-CM

## 2022-01-14 DIAGNOSIS — E1159 Type 2 diabetes mellitus with other circulatory complications: Secondary | ICD-10-CM | POA: Diagnosis not present

## 2022-01-14 DIAGNOSIS — Z6835 Body mass index (BMI) 35.0-35.9, adult: Secondary | ICD-10-CM | POA: Diagnosis not present

## 2022-01-14 NOTE — Assessment & Plan Note (Signed)
Chronic moderate - cont on Lovastatin ?Blood pressure control ? ?Cardiol ref ? ?

## 2022-01-14 NOTE — Assessment & Plan Note (Signed)
Wt Readings from Last 3 Encounters:  ?01/14/22 223 lb (101.2 kg)  ?10/15/21 223 lb 3.2 oz (101.2 kg)  ?10/12/21 220 lb 6.4 oz (100 kg)  ? ? ?

## 2022-01-14 NOTE — Assessment & Plan Note (Signed)
Chronic pain °Cont on Tramadol prn ° Potential benefits of a long term opioids use as well as potential risks (i.e. addiction risk, apnea etc) and complications (i.e. Somnolence, constipation and others) were explained to the patient and were aknowledged. °

## 2022-01-14 NOTE — Assessment & Plan Note (Signed)
Cont w/Benazepril HCT, Norvasc 10 mg/d 

## 2022-01-14 NOTE — Progress Notes (Signed)
? ?Subjective:  ?Patient ID: Kevin Weaver, male    DOB: Jul 24, 1941  Age: 81 y.o. MRN: [id number] ? ?CC: No chief complaint on file. ? ? ?HPI ?Kevin Weaver presents for HTN, OA, DM, HTN ?Pt fell yesterday - his pit pulled him and he fell on the concrete. No LOC ? ?Outpatient Medications Prior to Visit  ?Medication Sig Dispense Refill  ? amLODipine (NORVASC) 10 MG tablet Take 1 tablet (10 mg total) by mouth daily. 90 tablet 2  ? benazepril-hydrochlorthiazide (LOTENSIN HCT) 20-12.5 MG tablet Take 2 tablets by mouth daily. 180 tablet 2  ? Cholecalciferol (VITAMIN D3) 50 MCG (2000 UT) capsule Take 1 capsule (2,000 Units total) by mouth daily. 100 capsule 3  ? diclofenac Sodium (VOLTAREN) 1 % GEL Apply 1 application topically 4 (four) times daily. Apply to painful joints 100 g 3  ? glipiZIDE (GLUCOTROL XL) 10 MG 24 hr tablet Take 1 tablet (10 mg total) by mouth 2 (two) times daily. 180 tablet 1  ? lovastatin (MEVACOR) 20 MG tablet Take 1 tablet (20 mg total) by mouth at bedtime. 90 tablet 3  ? metFORMIN (GLUCOPHAGE) 1000 MG tablet TAKE 1 TABLET BY MOUTH TWICE A DAY WITH A MEAL 180 tablet 3  ? omeprazole (PRILOSEC) 20 MG capsule TAKE 1 CAPSULE BY MOUTH EVERY DAY 90 capsule 2  ? pioglitazone (ACTOS) 45 MG tablet Take 1 tablet (45 mg total) by mouth daily. 90 tablet 2  ? potassium chloride SA (KLOR-CON M20) 20 MEQ tablet Take 1 tablet (20 mEq total) by mouth daily. 90 tablet 2  ? traMADol (ULTRAM) 50 MG tablet TAKE 1 TABLET BY MOUTH EVERY 8 HOURS AS NEEDED FOR SEVERE PAIN. 90 tablet 2  ? ?No facility-administered medications prior to visit.  ? ? ?ROS: ?Review of Systems  ?Constitutional:  Negative for appetite change, fatigue and unexpected weight change.  ?HENT:  Negative for congestion, nosebleeds, sneezing, sore throat and trouble swallowing.   ?Eyes:  Negative for itching and visual disturbance.  ?Respiratory:  Negative for cough.   ?Cardiovascular:  Negative for chest pain, palpitations and leg swelling.   ?Gastrointestinal:  Negative for abdominal distention, blood in stool, diarrhea and nausea.  ?Genitourinary:  Negative for frequency and hematuria.  ?Musculoskeletal:  Positive for arthralgias and gait problem. Negative for back pain, joint swelling and neck pain.  ?Skin:  Positive for wound. Negative for rash.  ?Neurological:  Negative for dizziness, tremors, speech difficulty and weakness.  ?Psychiatric/Behavioral:  Negative for agitation, dysphoric mood and sleep disturbance. The patient is not nervous/anxious.   ? ?Objective:  ?BP 136/70 (BP Location: Left Arm, Patient Position: Sitting, Cuff Size: Large)   Pulse 86   Temp 98.7 ?F (37.1 ?C) (Oral)   Ht 5\' 6"  (1.676 m)   Wt 223 lb (101.2 kg)   SpO2 96%   BMI 35.99 kg/m?  ? ?BP Readings from Last 3 Encounters:  ?01/14/22 136/70  ?10/15/21 120/82  ?10/12/21 140/80  ? ? ?Wt Readings from Last 3 Encounters:  ?01/14/22 223 lb (101.2 kg)  ?10/15/21 223 lb 3.2 oz (101.2 kg)  ?10/12/21 220 lb 6.4 oz (100 kg)  ? ? ?Physical Exam ?Constitutional:   ?   General: He is not in acute distress. ?   Appearance: He is well-developed. He is obese.  ?   Comments: NAD  ?Eyes:  ?   Conjunctiva/sclera: Conjunctivae normal.  ?   Pupils: Pupils are equal, round, and reactive to light.  ?Neck:  ?  Thyroid: No thyromegaly.  ?   Vascular: No JVD.  ?Cardiovascular:  ?   Rate and Rhythm: Normal rate and regular rhythm.  ?   Heart sounds: Murmur heard.  ?  No friction rub. No gallop.  ?Pulmonary:  ?   Effort: Pulmonary effort is normal. No respiratory distress.  ?   Breath sounds: Normal breath sounds. No wheezing or rales.  ?Chest:  ?   Chest wall: No tenderness.  ?Abdominal:  ?   General: Bowel sounds are normal. There is no distension.  ?   Palpations: Abdomen is soft. There is no mass.  ?   Tenderness: There is no abdominal tenderness. There is no guarding or rebound.  ?Musculoskeletal:     ?   General: No tenderness. Normal range of motion.  ?   Cervical back: Normal range of  motion.  ?Lymphadenopathy:  ?   Cervical: No cervical adenopathy.  ?Skin: ?   General: Skin is warm and dry.  ?   Findings: No rash.  ?Neurological:  ?   Mental Status: He is alert and oriented to person, place, and time.  ?   Cranial Nerves: No cranial nerve deficit.  ?   Motor: No abnormal muscle tone.  ?   Coordination: Coordination abnormal.  ?   Gait: Gait abnormal.  ?   Deep Tendon Reflexes: Reflexes are normal and symmetric.  ?Psychiatric:     ?   Behavior: Behavior normal.     ?   Thought Content: Thought content normal.     ?   Judgment: Judgment normal.  ?Forehead w/abrasions ?2/6 murmur ?Antalgic gait ? ? ?Lab Results  ?Component Value Date  ? WBC 6.6 12/18/2020  ? HGB 11.3 (L) 12/18/2020  ? HCT 33.8 (L) 12/18/2020  ? PLT 455.0 (H) 12/18/2020  ? GLUCOSE 55 (L) 01/11/2022  ? CHOL 169 10/12/2021  ? TRIG 82.0 10/12/2021  ? HDL 53.00 10/12/2021  ? LDLCALC 100 (H) 10/12/2021  ? ALT 21 01/11/2022  ? AST 26 01/11/2022  ? NA 137 01/11/2022  ? K 4.3 01/11/2022  ? CL 101 01/11/2022  ? CREATININE 1.21 01/11/2022  ? BUN 25 (H) 01/11/2022  ? CO2 28 01/11/2022  ? TSH 1.82 12/18/2020  ? PSA 0.41 12/18/2020  ? HGBA1C 6.9 (H) 01/11/2022  ? MICROALBUR <0.7 12/18/2020  ? ? ?VAS US CAROTID ? ?Result Date: 12/28/2020 ?Carotid Arterial Duplex Study Indications:       Left bruit and patient denies any cerebrovascular symptoms. Risk Factors:      Hypertension, hyperlipidemia, Diabetes, no history of                    smoking. Passive smoke exposure. Comparison Study:  NA Performing Technologist: Tyna JakschKeisha Barrett RVT  Examination Guidelines: A complete evaluation includes B-mode imaging, spectral Doppler, color Doppler, and power Doppler as needed of all accessible portions of each vessel. Bilateral testing is considered an integral part of a complete examination. Limited examinations for reoccurring indications may be performed as noted.  Right Carotid Findings: +----------+--------+--------+--------+------------------+--------+            PSV cm/sEDV cm/sStenosisPlaque DescriptionComments +----------+--------+--------+--------+------------------+--------+ CCA Prox  69      20                                         +----------+--------+--------+--------+------------------+--------+ CCA Distal44      16      <  50%    heterogenous               +----------+--------+--------+--------+------------------+--------+ ICA Prox  37      14              heterogenous               +----------+--------+--------+--------+------------------+--------+ ICA Mid   47      21      1-39%   heterogenous               +----------+--------+--------+--------+------------------+--------+ ICA Distal80      39                                         +----------+--------+--------+--------+------------------+--------+ ECA       52      11              heterogenous               +----------+--------+--------+--------+------------------+--------+ +----------+--------+-------+---------+-------------------+           PSV cm/sEDV cmsDescribe Arm Pressure (mmHG) +----------+--------+-------+---------+-------------------+ [id number]             Turbulent140                 +----------+--------+-------+---------+-------------------+ +---------+--------+-+--------+-+------+ VertebralPSV cm/s0EDV cm/s0Absent +---------+--------+-+--------+-+------+ Prominent subclavian artery, measuring 1.7 cm AP x 1.7 cm TRV. Normal flow velocities in the innominate. Left Carotid Findings: +----------+-------+-------+--------+------------------------+-----------------+           PSV    EDV    StenosisPlaque Description      Comments                    cm/s   cm/s                                                     +----------+-------+-------+--------+------------------------+-----------------+ CCA Prox  47     16             heterogenous                               +----------+-------+-------+--------+------------------------+-----------------+ CCA Mid   50     18     <50%    heterogenous            intimal                                                                   thickening        +----------+-------+-------+--------+------------------------+-----------------+ CCA Distal37     10             heterogeno

## 2022-01-14 NOTE — Assessment & Plan Note (Signed)
Chronic ?Cont w/ Metformin, glipizide and Actos ?Loose a few lbs ?Cut back on [employer] liquor  ?

## 2022-01-14 NOTE — Assessment & Plan Note (Signed)
Pt fell yesterday - his pit pulled him and he fell on the concrete. No LOC ?Tripple abx oint ?

## 2022-01-14 NOTE — Assessment & Plan Note (Signed)
Chronic ?Cardiology referral ?

## 2022-03-06 ENCOUNTER — Ambulatory Visit (INDEPENDENT_AMBULATORY_CARE_PROVIDER_SITE_OTHER): Payer: Medicare Other | Admitting: Cardiology

## 2022-03-06 ENCOUNTER — Encounter: Payer: Self-pay | Admitting: Cardiology

## 2022-03-06 VITALS — BP 107/69 | HR 74 | Ht 70.0 in | Wt 218.0 lb

## 2022-03-06 DIAGNOSIS — I6523 Occlusion and stenosis of bilateral carotid arteries: Secondary | ICD-10-CM | POA: Diagnosis not present

## 2022-03-06 DIAGNOSIS — G4719 Other hypersomnia: Secondary | ICD-10-CM | POA: Diagnosis not present

## 2022-03-06 DIAGNOSIS — E7800 Pure hypercholesterolemia, unspecified: Secondary | ICD-10-CM

## 2022-03-06 DIAGNOSIS — Z9189 Other specified personal risk factors, not elsewhere classified: Secondary | ICD-10-CM | POA: Diagnosis not present

## 2022-03-06 DIAGNOSIS — I10 Essential (primary) hypertension: Secondary | ICD-10-CM | POA: Diagnosis not present

## 2022-03-06 DIAGNOSIS — R011 Cardiac murmur, unspecified: Secondary | ICD-10-CM | POA: Diagnosis not present

## 2022-03-06 NOTE — Patient Instructions (Signed)
Medication Instructions:  ?Your physician recommends that you continue on your current medications as directed. Please refer to the Current Medication list given to you today. ? ?*If you need a refill on your cardiac medications before your next appointment, please call your pharmacy* ? ?Testing/Procedures: ?Your physician has requested that you have an echocardiogram. Echocardiography is a painless test that uses sound waves to create images of your heart. It provides your doctor with information about the size and shape of your heart and how well your heart?s chambers and valves are working. This procedure takes approximately one hour. There are no restrictions for this procedure. ? ?Your physician has requested that you have a lexiscan myoview. For further information please visit [email or web address] Please follow instruction sheet, as given. ? ?Your physician has requested that you have a carotid duplex. This test is an ultrasound of the carotid arteries in your neck. It looks at blood flow through these arteries that supply the brain with blood. Allow one hour for this exam. There are no restrictions or special instructions. ? ?Follow-Up: ?At [facility], you and your health needs are our priority.  As part of our continuing mission to provide you with exceptional heart care, we have created designated Provider Care Teams.  These Care Teams include your primary Cardiologist (physician) and Advanced Practice Providers (APPs -  Physician Assistants and Nurse Practitioners) who all work together to provide you with the care you need, when you need it. ? ?Follow up with Dr. Mayford Knife as needed based on results of testing.  ? ?Referrals: ?You have been referred to see our PharmD in the [facility] ? ?Important Information About Sugar ? ? ? ? ?  ?

## 2022-03-06 NOTE — Addendum Note (Signed)
Addended by: Antonieta Iba on: 03/06/2022 02:18 PM ? ? Modules accepted: Orders ? ?
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
## 2022-03-06 NOTE — Addendum Note (Signed)
Addended by: Kaitlin Alcindor R on: 03/06/2022 04:50 PM ? ? Modules accepted: Orders ? ?

## 2022-03-22 ENCOUNTER — Telehealth (HOSPITAL_COMMUNITY): Payer: Self-pay | Admitting: *Deleted

## 2022-03-22 NOTE — Telephone Encounter (Signed)
Patient given detailed instructions per Myocardial Perfusion Study Information Sheet for the test on 03/29/2022 at 10:00. Patient notified to arrive 15 minutes early and that it is imperative to arrive on time for appointment to keep from having the test rescheduled.  If you need to cancel or reschedule your appointment, please call the office within 24 hours of your appointment. . Patient verbalized understanding.Kevin Weaver

## 2022-03-28 ENCOUNTER — Ambulatory Visit (HOSPITAL_COMMUNITY)
Admission: RE | Admit: 2022-03-28 | Discharge: 2022-03-28 | Disposition: A | Discharge status: Home / Self Care | Payer: Medicare Other | Source: Ambulatory Visit | Attending: Cardiology | Admitting: Cardiology

## 2022-03-28 DIAGNOSIS — Z9189 Other specified personal risk factors, not elsewhere classified: Secondary | ICD-10-CM | POA: Diagnosis not present

## 2022-03-28 DIAGNOSIS — I6523 Occlusion and stenosis of bilateral carotid arteries: Secondary | ICD-10-CM | POA: Insufficient documentation

## 2022-03-28 DIAGNOSIS — E7800 Pure hypercholesterolemia, unspecified: Secondary | ICD-10-CM | POA: Insufficient documentation

## 2022-03-28 DIAGNOSIS — G4719 Other hypersomnia: Secondary | ICD-10-CM | POA: Diagnosis not present

## 2022-03-28 DIAGNOSIS — R011 Cardiac murmur, unspecified: Secondary | ICD-10-CM | POA: Diagnosis not present

## 2022-03-28 DIAGNOSIS — I10 Essential (primary) hypertension: Secondary | ICD-10-CM | POA: Insufficient documentation

## 2022-03-29 ENCOUNTER — Ambulatory Visit (HOSPITAL_COMMUNITY): Payer: Medicare Other | Attending: Cardiology

## 2022-03-29 ENCOUNTER — Encounter: Payer: Self-pay | Admitting: Cardiology

## 2022-03-29 ENCOUNTER — Ambulatory Visit (HOSPITAL_BASED_OUTPATIENT_CLINIC_OR_DEPARTMENT_OTHER): Payer: Medicare Other

## 2022-03-29 ENCOUNTER — Ambulatory Visit (INDEPENDENT_AMBULATORY_CARE_PROVIDER_SITE_OTHER): Payer: Medicare Other | Admitting: Pharmacist

## 2022-03-29 ENCOUNTER — Telehealth: Payer: Self-pay | Admitting: *Deleted

## 2022-03-29 DIAGNOSIS — Z01812 Encounter for preprocedural laboratory examination: Secondary | ICD-10-CM

## 2022-03-29 DIAGNOSIS — E1159 Type 2 diabetes mellitus with other circulatory complications: Secondary | ICD-10-CM

## 2022-03-29 DIAGNOSIS — E7800 Pure hypercholesterolemia, unspecified: Secondary | ICD-10-CM | POA: Diagnosis not present

## 2022-03-29 DIAGNOSIS — I6523 Occlusion and stenosis of bilateral carotid arteries: Secondary | ICD-10-CM

## 2022-03-29 DIAGNOSIS — Z9189 Other specified personal risk factors, not elsewhere classified: Secondary | ICD-10-CM | POA: Insufficient documentation

## 2022-03-29 DIAGNOSIS — R011 Cardiac murmur, unspecified: Secondary | ICD-10-CM

## 2022-03-29 DIAGNOSIS — E782 Mixed hyperlipidemia: Secondary | ICD-10-CM

## 2022-03-29 DIAGNOSIS — G4719 Other hypersomnia: Secondary | ICD-10-CM | POA: Insufficient documentation

## 2022-03-29 DIAGNOSIS — I10 Essential (primary) hypertension: Secondary | ICD-10-CM

## 2022-03-29 DIAGNOSIS — I7121 Aneurysm of the ascending aorta, without rupture: Secondary | ICD-10-CM

## 2022-03-29 DIAGNOSIS — I350 Nonrheumatic aortic (valve) stenosis: Secondary | ICD-10-CM | POA: Insufficient documentation

## 2022-03-29 LAB — MYOCARDIAL PERFUSION IMAGING
LV dias vol: 86 mL (ref 62–150)
LV sys vol: 31 mL
Nuc Stress EF: 64 %
Peak HR: 78 {beats}/min
Rest HR: 59 {beats}/min
Rest Nuclear Isotope Dose: 10.3 mCi
SDS: 0
SRS: 0
SSS: 0
ST Depression (mm): 0 mm
Stress Nuclear Isotope Dose: 30.8 mCi
TID: 0.94

## 2022-03-29 LAB — ECHOCARDIOGRAM COMPLETE
AR max vel: 1.43 cm2
AV Area VTI: 1.34 cm2
AV Area mean vel: 1.45 cm2
AV Mean grad: 18 mmHg
AV Peak grad: 34.2 mmHg
Ao pk vel: 2.93 m/s
Area-P 1/2: 3.4 cm2
Height: 70 in
S' Lateral: 2.9 cm
Weight: 3488 oz

## 2022-03-29 MED ORDER — TECHNETIUM TC 99M TETROFOSMIN IV KIT
30.8000 | PACK | Freq: Once | INTRAVENOUS | Status: AC | PRN
  Administered 2022-03-29: 30.8 via INTRAVENOUS

## 2022-03-29 MED ORDER — HYDROCHLOROTHIAZIDE 25 MG PO TABS: 25.0000 mg | ORAL_TABLET | Freq: Every day | ORAL | 3 refills | Status: DC

## 2022-03-29 MED ORDER — BENAZEPRIL HCL 40 MG PO TABS: 40.0000 mg | ORAL_TABLET | Freq: Every day | ORAL | 3 refills | Status: DC

## 2022-03-29 MED ORDER — PERFLUTREN LIPID MICROSPHERE
2.0000 mL | INTRAVENOUS | Status: AC | PRN
  Administered 2022-03-29: 2 mL via INTRAVENOUS

## 2022-03-29 MED ORDER — REGADENOSON 0.4 MG/5ML IV SOLN
0.4000 mg | Freq: Once | INTRAVENOUS | Status: AC
  Administered 2022-03-29: 0.4 mg via INTRAVENOUS

## 2022-03-29 MED ORDER — TECHNETIUM TC 99M TETROFOSMIN IV KIT
10.3000 | PACK | Freq: Once | INTRAVENOUS | Status: AC | PRN
  Administered 2022-03-29: 10.3 via INTRAVENOUS

## 2022-03-29 MED ORDER — ATORVASTATIN CALCIUM 40 MG PO TABS: 40.0000 mg | ORAL_TABLET | Freq: Every day | ORAL | 3 refills | Status: DC

## 2022-03-29 NOTE — Patient Instructions (Signed)
It was nice to see you today  Blood pressure: Stop taking benazepril-HCTZ 20-12.5mg  2 tablets daily because of the cost. I have sent in refills to your pharmacy for each of the 2 medications separately that should be much cheaper. This will be: - Benazepril 40mg  -1 tablet daily - HCTZ (hydrochlorothiazide) 25mg  - 1 tablet daily  Cholesterol: - Your LDL cholesterol is 100 and your goal is < 70 because of your carotid stenosis - Stop taking lovastatin - Start taking atorvastatin 40mg  - 1 tablet daily
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
## 2022-03-29 NOTE — Telephone Encounter (Signed)
Echo showed normal heart function with moderately thickened heart muscle and increased stiffness of the heart muscle, mild left atrial enlargement, mildly leaky mitral valve and moderate leakiness of the tricuspid valve.  There is moderate aortic valve stenosis.  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  Please cardiac and chest gated MRI/MRA to assess ascending aorta as well as moderate basal septal hypertrophy to rule out HOCM   Left message for pt to call back to discuss results and need for further testing.

## 2022-03-31 ENCOUNTER — Encounter: Payer: Self-pay | Admitting: Cardiology

## 2022-03-31 DIAGNOSIS — I6509 Occlusion and stenosis of unspecified vertebral artery: Secondary | ICD-10-CM | POA: Insufficient documentation

## 2022-04-01 NOTE — Telephone Encounter (Signed)
Lm to call back ./cy 

## 2022-04-04 ENCOUNTER — Telehealth: Payer: Self-pay | Admitting: Cardiology

## 2022-04-04 NOTE — Telephone Encounter (Signed)
The patient and niece has been notified of the result and verbalized understanding.  All questions (if any) were answered. Sampson Goon, RN 04/04/2022 11:56 AM    Test and lab ordered. Labs scheduled.  Made aware office will call to schedule testing.

## 2022-04-04 NOTE — Telephone Encounter (Signed)
See other encounter.

## 2022-04-04 NOTE — Telephone Encounter (Signed)
Patient's niece was calling to get results of patient. Please call back

## 2022-04-05 ENCOUNTER — Other Ambulatory Visit: Payer: Medicare Other

## 2022-04-05 DIAGNOSIS — Z01812 Encounter for preprocedural laboratory examination: Secondary | ICD-10-CM

## 2022-04-05 DIAGNOSIS — I7121 Aneurysm of the ascending aorta, without rupture: Secondary | ICD-10-CM | POA: Diagnosis not present

## 2022-04-05 LAB — CBC
Hematocrit: 34.1 % — ABNORMAL LOW (ref 37.5–51.0)
Hemoglobin: 11.5 g/dL — ABNORMAL LOW (ref 13.0–17.7)
MCH: 30.9 pg (ref 26.6–33.0)
MCHC: 33.7 g/dL (ref 31.5–35.7)
MCV: 92 fL (ref 79–97)
Platelets: 226 10*3/uL (ref 150–450)
RBC: 3.72 x10E6/uL — ABNORMAL LOW (ref 4.14–5.80)
RDW: 15.3 % (ref 11.6–15.4)
WBC: 5.5 10*3/uL (ref 3.4–10.8)

## 2022-04-08 ENCOUNTER — Telehealth: Payer: Self-pay | Admitting: Cardiology

## 2022-04-08 MED ORDER — DIAZEPAM 5 MG PO TABS: 5.0000 mg | ORAL_TABLET | Freq: Once | ORAL | 0 refills | Status: DC | PRN

## 2022-04-08 NOTE — Telephone Encounter (Signed)
Quintella Reichert, MD to [facility] Triage     04/08/22  3:03 PM Ok to give Rx for Valium 5mg  #1 tablet with no refills to take 1 hour before MRI.  He will need someone to take him to the appt and home  Called in prescription to [facility].  Notified niece that prescription was sent and that patient could not drive after that this medication. reports the patient does not drive.  Verbalized understanding a agreement.

## 2022-04-08 NOTE — Telephone Encounter (Signed)
Called and spoke to patient's niece (DPR on file)  She states that the patient has a torn rotator cuff (he refuses to have surgery for) and is claustrophobic and is unable to lie still for long periods of time.   Patient is willing to try to do Cardiac MRI if Dr. Mayford Knife will send in a RX to relax him since he has claustrophobia.   Will forward to Dr. Mayford Knife for review.

## 2022-04-08 NOTE — Telephone Encounter (Signed)
Patient's niece is calling stating patient would like alternate test to MRI because he is claustrophobic and has a torn rotator cuff and is unable to lie still for long periods.

## 2022-04-16 ENCOUNTER — Ambulatory Visit (INDEPENDENT_AMBULATORY_CARE_PROVIDER_SITE_OTHER): Payer: Medicare Other | Admitting: Internal Medicine

## 2022-04-16 ENCOUNTER — Encounter: Payer: Self-pay | Admitting: Internal Medicine

## 2022-04-16 DIAGNOSIS — I10 Essential (primary) hypertension: Secondary | ICD-10-CM | POA: Diagnosis not present

## 2022-04-16 DIAGNOSIS — E1159 Type 2 diabetes mellitus with other circulatory complications: Secondary | ICD-10-CM | POA: Diagnosis not present

## 2022-04-16 DIAGNOSIS — I6523 Occlusion and stenosis of bilateral carotid arteries: Secondary | ICD-10-CM

## 2022-04-16 DIAGNOSIS — E782 Mixed hyperlipidemia: Secondary | ICD-10-CM

## 2022-04-16 LAB — LIPID PANEL
Cholesterol: 122 mg/dL (ref 0–200)
HDL: 48.7 mg/dL (ref >39.00)
LDL Cholesterol: 60 mg/dL (ref 0–99)
NonHDL: 72.9
Total CHOL/HDL Ratio: 2
Triglycerides: 64 mg/dL (ref 0.0–149.0)
VLDL: 12.8 mg/dL (ref 0.0–40.0)

## 2022-04-16 LAB — COMPREHENSIVE METABOLIC PANEL
ALT: 20 U/L (ref 0–53)
AST: 26 U/L (ref 0–37)
Albumin: 4 g/dL (ref 3.5–5.2)
Alkaline Phosphatase: 60 U/L (ref 39–117)
BUN: 23 mg/dL (ref 6–23)
CO2: 28 mEq/L (ref 19–32)
Calcium: 9.6 mg/dL (ref 8.4–10.5)
Chloride: 101 mEq/L (ref 96–112)
Creatinine, Ser: 1.03 mg/dL (ref 0.40–1.50)
GFR: 68.52 mL/min (ref >60.00)
Glucose, Bld: 68 mg/dL — ABNORMAL LOW (ref 70–99)
Potassium: 4.5 mEq/L (ref 3.5–5.1)
Sodium: 138 mEq/L (ref 135–145)
Total Bilirubin: 0.5 mg/dL (ref 0.2–1.2)
Total Protein: 7 g/dL (ref 6.0–8.3)

## 2022-04-16 LAB — HEMOGLOBIN A1C: Hgb A1c MFr Bld: 6.6 % — ABNORMAL HIGH (ref 4.6–6.5)

## 2022-04-16 MED ORDER — POTASSIUM CHLORIDE CRYS ER 20 MEQ PO TBCR: 20.0000 meq | EXTENDED_RELEASE_TABLET | Freq: Every day | ORAL | 3 refills | Status: DC

## 2022-04-16 MED ORDER — ATORVASTATIN CALCIUM 40 MG PO TABS: 40.0000 mg | ORAL_TABLET | Freq: Every day | ORAL | 3 refills | Status: DC

## 2022-04-16 MED ORDER — HYDROCHLOROTHIAZIDE 25 MG PO TABS: 25.0000 mg | ORAL_TABLET | Freq: Every day | ORAL | 3 refills | Status: DC

## 2022-04-16 MED ORDER — TRAMADOL HCL 50 MG PO TABS: ORAL_TABLET | ORAL | 2 refills | Status: DC

## 2022-04-16 MED ORDER — METFORMIN HCL 1000 MG PO TABS
ORAL_TABLET | ORAL | 3 refills | Status: DC
Start: 2022-04-16 — End: 2022-07-18

## 2022-04-16 MED ORDER — OMEPRAZOLE 20 MG PO CPDR
DELAYED_RELEASE_CAPSULE | ORAL | 3 refills | Status: DC
Start: 2022-04-16 — End: 2022-07-18

## 2022-04-16 MED ORDER — PIOGLITAZONE HCL 45 MG PO TABS: 45.0000 mg | ORAL_TABLET | Freq: Every day | ORAL | 3 refills | Status: DC

## 2022-04-16 MED ORDER — GLIPIZIDE ER 10 MG PO TB24: 10.0000 mg | ORAL_TABLET | Freq: Two times a day (BID) | ORAL | 3 refills | Status: DC

## 2022-04-16 MED ORDER — AMLODIPINE BESYLATE 10 MG PO TABS: 10.0000 mg | ORAL_TABLET | Freq: Every day | ORAL | 3 refills | Status: DC

## 2022-04-16 NOTE — Assessment & Plan Note (Addendum)
F/u w/Dr Mayford Knife On Lipitor now

## 2022-04-16 NOTE — Assessment & Plan Note (Signed)
Cont w/Benazepril HCT, Norvasc 10 mg/d 

## 2022-04-16 NOTE — Progress Notes (Signed)
Subjective:  Patient ID: Kevin Weaver, male    DOB: 05-Oct-1941  Age: 81 y.o. MRN: [id number]  CC: No chief complaint on file.   HPI KENTRELL LANGFORD presents for HTN, OA, DM  Outpatient Medications Prior to Visit  Medication Sig Dispense Refill   Cholecalciferol (VITAMIN D3) 50 MCG (2000 UT) capsule Take 1 capsule (2,000 Units total) by mouth daily. 100 capsule 3   Cyanocobalamin (B-12) 5000 MCG CAPS Take by mouth daily with breakfast.     diazepam (VALIUM) 5 MG tablet Take 1 tablet (5 mg total) by mouth Once PRN for up to 1 dose for anxiety (Take one hour before MRI.). 1 tablet 0   diclofenac Sodium (VOLTAREN) 1 % GEL Apply 1 application topically 4 (four) times daily. Apply to painful joints 100 g 3   Zinc 50 MG CAPS Take by mouth at bedtime.     amLODipine (NORVASC) 10 MG tablet Take 1 tablet (10 mg total) by mouth daily. 90 tablet 2   atorvastatin (LIPITOR) 40 MG tablet Take 1 tablet (40 mg total) by mouth daily. 90 tablet 3   atorvastatin (LIPITOR) 40 MG tablet Take 40 mg by mouth daily.     benazepril (LOTENSIN) 40 MG tablet Take 1 tablet (40 mg total) by mouth daily. 90 tablet 3   glipiZIDE (GLUCOTROL XL) 10 MG 24 hr tablet Take 1 tablet (10 mg total) by mouth 2 (two) times daily. 180 tablet 1   hydrochlorothiazide (HYDRODIURIL) 25 MG tablet Take 1 tablet (25 mg total) by mouth daily. 90 tablet 3   metFORMIN (GLUCOPHAGE) 1000 MG tablet TAKE 1 TABLET BY MOUTH TWICE A DAY WITH A MEAL 180 tablet 3   omeprazole (PRILOSEC) 20 MG capsule TAKE 1 CAPSULE BY MOUTH EVERY DAY 90 capsule 2   pioglitazone (ACTOS) 45 MG tablet Take 1 tablet (45 mg total) by mouth daily. 90 tablet 2   potassium chloride SA (KLOR-CON M20) 20 MEQ tablet Take 1 tablet (20 mEq total) by mouth daily. 90 tablet 2   traMADol (ULTRAM) 50 MG tablet TAKE 1 TABLET BY MOUTH EVERY 8 HOURS AS NEEDED FOR SEVERE PAIN. 90 tablet 2   lovastatin (MEVACOR) 20 MG tablet Take 20 mg by mouth daily.     No facility-administered  medications prior to visit.    ROS: Review of Systems  Constitutional:  Negative for appetite change, fatigue and unexpected weight change.  HENT:  Negative for congestion, nosebleeds, sneezing, sore throat and trouble swallowing.   Eyes:  Negative for itching and visual disturbance.  Respiratory:  Negative for cough.   Cardiovascular:  Negative for chest pain, palpitations and leg swelling.  Gastrointestinal:  Negative for abdominal distention, blood in stool, diarrhea and nausea.  Genitourinary:  Negative for frequency and hematuria.  Musculoskeletal:  Positive for arthralgias and gait problem. Negative for back pain, joint swelling and neck pain.  Skin:  Negative for rash.  Neurological:  Negative for dizziness, tremors, speech difficulty and weakness.  Psychiatric/Behavioral:  Negative for agitation, dysphoric mood and sleep disturbance. The patient is not nervous/anxious.     Objective:  BP 120/70 (BP Location: Left Arm, Patient Position: Sitting, Cuff Size: Normal)   Pulse 63   Temp 98 F (36.7 C) (Oral)   Ht 5\' 10"  (1.778 m)   Wt 220 lb (99.8 kg)   SpO2 98%   BMI 31.57 kg/m   BP Readings from Last 3 Encounters:  04/16/22 120/70  03/06/22 107/69  01/14/22 136/70  Wt Readings from Last 3 Encounters:  04/16/22 220 lb (99.8 kg)  03/29/22 218 lb (98.9 kg)  03/06/22 218 lb (98.9 kg)    Physical Exam Constitutional:      General: He is not in acute distress.    Appearance: He is well-developed. He is obese.     Comments: NAD  Eyes:     Conjunctiva/sclera: Conjunctivae normal.     Pupils: Pupils are equal, round, and reactive to light.  Neck:     Thyroid: No thyromegaly.     Vascular: No JVD.  Cardiovascular:     Rate and Rhythm: Normal rate and regular rhythm.     Heart sounds: Normal heart sounds. No murmur heard.    No friction rub. No gallop.  Pulmonary:     Effort: Pulmonary effort is normal. No respiratory distress.     Breath sounds: Normal breath  sounds. No wheezing or rales.  Chest:     Chest wall: No tenderness.  Abdominal:     General: Bowel sounds are normal. There is no distension.     Palpations: Abdomen is soft. There is no mass.     Tenderness: There is no abdominal tenderness. There is no guarding or rebound.  Musculoskeletal:        General: Tenderness and deformity present. Normal range of motion.     Cervical back: Normal range of motion.  Lymphadenopathy:     Cervical: No cervical adenopathy.  Skin:    General: Skin is warm and dry.     Findings: No rash.  Neurological:     Mental Status: He is alert and oriented to person, place, and time.     Cranial Nerves: No cranial nerve deficit.     Motor: No abnormal muscle tone.     Coordination: Coordination normal.     Gait: Gait normal.     Deep Tendon Reflexes: Reflexes are normal and symmetric.  Psychiatric:        Behavior: Behavior normal.        Thought Content: Thought content normal.        Judgment: Judgment normal.   Missing fingers, toes limping  Lab Results  Component Value Date   WBC 5.5 04/05/2022   HGB 11.5 (L) 04/05/2022   HCT 34.1 (L) 04/05/2022   PLT 226 04/05/2022   GLUCOSE 55 (L) 01/11/2022   CHOL 169 10/12/2021   TRIG 82.0 10/12/2021   HDL 53.00 10/12/2021   LDLCALC 100 (H) 10/12/2021   ALT 21 01/11/2022   AST 26 01/11/2022   NA 137 01/11/2022   K 4.3 01/11/2022   CL 101 01/11/2022   CREATININE 1.21 01/11/2022   BUN 25 (H) 01/11/2022   CO2 28 01/11/2022   TSH 1.82 12/18/2020   PSA 0.41 12/18/2020   HGBA1C 6.9 (H) 01/11/2022   MICROALBUR <0.7 12/18/2020    VAS US CAROTID  Result Date: 03/30/2022 Carotid Arterial Duplex Study Patient Name:  HANS FREIRE Noh  Date of Exam:   03/28/2022 Medical Rec #: [id number]       Accession #:    [id number] Date of Birth: 1941-01-07      Patient Gender: M Patient Age:   89 years Exam Location:  [facility] Procedure:      VAS US CAROTID Referring Phys: Tressia Miners TURNER  --------------------------------------------------------------------------------  Indications:       Carotid artery stenosis. Patient denies cerebrovascular  symptoms but does complain that his fingers on the left hand                    will sometimes feel cold and numb. Risk Factors:      Hypertension, Diabetes, no history of smoking. Comparison Study:  Previous carotid duplex performed 12/27/20 showed RICA                    velocities of [id number] cm/sec and LICA velocities of 0000000                    cm/sec. Performing Technologist: Mariane Masters RVT  Examination Guidelines: A complete evaluation includes B-mode imaging, spectral Doppler, color Doppler, and power Doppler as needed of all accessible portions of each vessel. Bilateral testing is considered an integral part of a complete examination. Limited examinations for reoccurring indications may be performed as noted.  Right Carotid Findings: +----------+--------+--------+--------+------------------+--------+           PSV cm/sEDV cm/sStenosisPlaque DescriptionComments +----------+--------+--------+--------+------------------+--------+ CCA Prox  52      11                                         +----------+--------+--------+--------+------------------+--------+ CCA Distal62      16                                         +----------+--------+--------+--------+------------------+--------+ ICA Prox  63      21              heterogenous               +----------+--------+--------+--------+------------------+--------+ ICA Mid   67      21      1-39%                              +----------+--------+--------+--------+------------------+--------+ ICA Distal71      32                                         +----------+--------+--------+--------+------------------+--------+ ECA       54      14                                          +----------+--------+--------+--------+------------------+--------+ +----------+--------+-------+----------------+-------------------+           PSV cm/sEDV cmsDescribe        Arm Pressure (mmHG) +----------+--------+-------+----------------+-------------------+ [id number]             Multiphasic, [id number]                 +----------+--------+-------+----------------+-------------------+ +---------+--------+-+--------+-+------+ VertebralPSV cm/s0EDV cm/s0Absent +---------+--------+-+--------+-+------+  Left Carotid Findings: +----------+--------+--------+--------+-------------------------+--------+           PSV cm/sEDV cm/sStenosisPlaque Description       Comments +----------+--------+--------+--------+-------------------------+--------+ CCA Prox  66      17                                                +----------+--------+--------+--------+-------------------------+--------+  CCA Distal55      16                                                +----------+--------+--------+--------+-------------------------+--------+ ICA Prox  133     49              heterogenous                      +----------+--------+--------+--------+-------------------------+--------+ ICA Mid   166     55      40-59%  irregular and hyperechoic         +----------+--------+--------+--------+-------------------------+--------+ ICA Distal43      15                                                +----------+--------+--------+--------+-------------------------+--------+ ECA       47      12                                                +----------+--------+--------+--------+-------------------------+--------+ +----------+--------+--------+----------------+-------------------+           PSV cm/sEDV cm/sDescribe        Arm Pressure (mmHG) +----------+--------+--------+----------------+-------------------+ Subclavian80      0       Multiphasic, [id number]                  +----------+--------+--------+----------------+-------------------+ +---------+--------+--+--------+--+---------+ VertebralPSV cm/s42EDV cm/s17Antegrade +---------+--------+--+--------+--+---------+   Summary: Right Carotid: Velocities in the right ICA are consistent with a 1-39% stenosis. 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 Vertebrals:  Left vertebral artery demonstrates antegrade flow. Right vertebral              artery demonstrates an occlusion. Subclavians: Normal flow hemodynamics were seen in bilateral subclavian              arteries. *See table(s) above for measurements and observations. Suggest follow up study in 12 months. Electronically signed by Dina Rich MD on 03/30/2022 at 3:45:36 PM.    Final    MYOCARDIAL PERFUSION IMAGING  Result Date: 03/29/2022   The study is normal. Findings are consistent with no prior ischemia and no prior myocardial infarction. The study is low risk.   No ST deviation was noted.   LV perfusion is normal. There is no evidence of ischemia. There is no evidence of infarction.   Left ventricular function is normal. Nuclear stress EF: 64 %. The left ventricular ejection fraction is normal (55-65%). End diastolic cavity size is normal. End systolic cavity size is normal.   Prior study available for comparison from 03/16/2009.   ECHOCARDIOGRAM COMPLETE  Result Date: 03/29/2022    ECHOCARDIOGRAM REPORT   Patient Name:   DENG KEMLER Date of Exam: 03/29/2022 Medical Rec #:  [id number]      Height:       70.0 in Accession #:    [id number]     Weight:       218.0 lb Date of Birth:  20-Jan-1941     BSA:  2.165 m Patient Age:    52 years       BP:           107/69 mmHg Patient Gender: M              HR:           57 bpm. Exam Location:  [facility] Procedure: 2D Echo, Cardiac Doppler, Color Doppler and Intracardiac            Opacification Agent Indications:    R01.1 Murmur  History:        Patient has no prior history  of Echocardiogram examinations.                 Signs/Symptoms:Murmur; Risk Factors:Hypertension, Dyslipidemia                 and Diabetes.  Sonographer:    Coralyn Helling RDCS Referring Phys: [phone] TRACI R TURNER  Sonographer Comments: Technically difficult study due to poor echo windows. IMPRESSIONS  1. Left ventricular ejection fraction, by estimation, is 60 to 65%. The left ventricle has normal function. The left ventricle has no regional wall motion abnormalities. 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 Left ventricular diastolic parameters are consistent with Grade I diastolic dysfunction (impaired relaxation).  2. Right ventricular systolic function is normal. The right ventricular size is mildly enlarged. There is normal pulmonary artery systolic pressure. The estimated right ventricular systolic pressure is [id number] mmHg.  3. Left atrial size was mildly dilated.  4. The mitral valve is normal in structure. Mild mitral valve regurgitation. No evidence of mitral stenosis.  5. Tricuspid valve regurgitation is moderate.  6. The aortic valve is normal in structure. There is mild calcification of the aortic valve. There is mild thickening of the aortic valve. Aortic valve regurgitation is not visualized. Moderate aortic valve stenosis. Aortic valve area, by VTI measures 1.34 cm. Aortic valve mean gradient measures 18.0 mmHg. Aortic valve Vmax measures 2.92 m/s.  7. Aortic dilatation noted. There is moderate dilatation of the ascending aorta, measuring 45 mm.  8. The inferior vena cava is normal in size with greater than 50% respiratory variability, suggesting right atrial pressure of 3 mmHg. FINDINGS  Left Ventricle: Left ventricular ejection fraction, by estimation, is 60 to 65%. The left ventricle has normal function. The left ventricle has no regional wall motion abnormalities. Definity contrast agent was given IV to delineate the left ventricular  endocardial  borders. The left ventricular internal cavity size was normal in size. 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 Left ventricular diastolic parameters are consistent with Grade I diastolic dysfunction (impaired relaxation). Right Ventricle: The right ventricular size is mildly enlarged. No increase in right ventricular wall thickness. Right ventricular systolic function is normal. There is normal pulmonary artery systolic pressure. The tricuspid regurgitant velocity is 2.28  m/s, and with an assumed right atrial pressure of 3 mmHg, the estimated right ventricular systolic pressure is [id number] mmHg. Left Atrium: Left atrial size was mildly dilated. Right Atrium: Right atrial size was normal in size. Pericardium: There is no evidence of pericardial effusion. Mitral Valve: The mitral valve is normal in structure. Mild mitral valve regurgitation. No evidence of mitral valve stenosis. Tricuspid Valve: The tricuspid valve is normal in structure. Tricuspid valve regurgitation is moderate . No evidence of tricuspid stenosis. Aortic Valve: The aortic valve is normal in structure. There is mild calcification of the aortic valve. There is mild  thickening of the aortic valve. Aortic valve regurgitation is not visualized. Moderate aortic stenosis is present. Aortic valve mean gradient measures 18.0 mmHg. Aortic valve peak gradient measures 34.2 mmHg. Aortic valve area, by VTI measures 1.34 cm. Pulmonic Valve: The pulmonic valve was normal in structure. Pulmonic valve regurgitation is not visualized. No evidence of pulmonic stenosis. Aorta: The aortic root is normal in size and structure and aortic dilatation noted. There is moderate dilatation of the ascending aorta, measuring 45 mm. Venous: The inferior vena cava is normal in size with greater than 50% respiratory variability, suggesting right atrial pressure of 3 mmHg. IAS/Shunts: No atrial level shunt detected by color flow Doppler.   LEFT VENTRICLE PLAX 2D LVIDd:         4.90 cm   Diastology LVIDs:         2.90 cm   LV e' medial:    5.44 cm/s LV PW:         1.10 cm   LV E/e' medial:  17.3 LV IVS:        1.60 cm   LV e' lateral:   9.79 cm/s LVOT diam:     2.30 cm   LV E/e' lateral: 9.6 LV SV:         93 LV SV Index:   43 LVOT Area:     4.15 cm  RIGHT VENTRICLE RVSP:           23.8 mmHg LEFT ATRIUM             Index        RIGHT ATRIUM           Index LA diam:        5.50 cm 2.54 cm/m   RA Pressure: 3.00 mmHg LA Vol (A2C):   59.7 ml 27.57 ml/m  RA Area:     14.60 cm LA Vol (A4C):   83.7 ml 38.66 ml/m  RA Volume:   28.80 ml  13.30 ml/m LA Biplane Vol: 73.0 ml 33.72 ml/m  AORTIC VALVE AV Area (Vmax):    1.43 cm AV Area (Vmean):   1.45 cm AV Area (VTI):     1.34 cm AV Vmax:           292.50 cm/s AV Vmean:          196.500 cm/s AV VTI:            0.692 m AV Peak Grad:      34.2 mmHg AV Mean Grad:      18.0 mmHg LVOT Vmax:         101.00 cm/s LVOT Vmean:        68.400 cm/s LVOT VTI:          0.224 m LVOT/AV VTI ratio: 0.32  AORTA Ao Root diam: 4.10 cm Ao Asc diam:  4.50 cm MITRAL VALVE                TRICUSPID VALVE MV Area (PHT): 3.40 cm     TR Peak grad:   20.8 mmHg MV Decel Time: 223 msec     TR Vmax:        228.00 cm/s MV E velocity: 94.30 cm/s   Estimated RAP:  3.00 mmHg MV A velocity: 103.00 cm/s  RVSP:           23.8 mmHg MV E/A ratio:  0.92  SHUNTS                             Systemic VTI:  0.22 m                             Systemic Diam: 2.30 cm Donato Schultz MD Electronically signed by Donato Schultz MD Signature Date/Time: 03/29/2022/1:34:26 PM    Final     Assessment & Plan:   Problem List Items Addressed This Visit     DM (diabetes mellitus) (HCC)    Cont w/ Metformin, glipizide and Actos. On Lipitor now      Relevant Medications   atorvastatin (LIPITOR) 40 MG tablet   glipiZIDE (GLUCOTROL XL) 10 MG 24 hr tablet   metFORMIN (GLUCOPHAGE) 1000 MG tablet   pioglitazone (ACTOS) 45 MG tablet   HTN  (hypertension)    Cont w/Benazepril HCT, Norvasc 10 mg/d      Relevant Medications   amLODipine (NORVASC) 10 MG tablet   atorvastatin (LIPITOR) 40 MG tablet   hydrochlorothiazide (HYDRODIURIL) 25 MG tablet   Hyperlipidemia    F/u w/Dr Mayford Knife On Lipitor now      Relevant Medications   amLODipine (NORVASC) 10 MG tablet   atorvastatin (LIPITOR) 40 MG tablet   hydrochlorothiazide (HYDRODIURIL) 25 MG tablet      Meds ordered this encounter  Medications   amLODipine (NORVASC) 10 MG tablet    Sig: Take 1 tablet (10 mg total) by mouth daily.    Dispense:  90 tablet    Refill:  3   atorvastatin (LIPITOR) 40 MG tablet    Sig: Take 1 tablet (40 mg total) by mouth daily.    Dispense:  90 tablet    Refill:  3   glipiZIDE (GLUCOTROL XL) 10 MG 24 hr tablet    Sig: Take 1 tablet (10 mg total) by mouth 2 (two) times daily.    Dispense:  180 tablet    Refill:  3   hydrochlorothiazide (HYDRODIURIL) 25 MG tablet    Sig: Take 1 tablet (25 mg total) by mouth daily.    Dispense:  90 tablet    Refill:  3   metFORMIN (GLUCOPHAGE) 1000 MG tablet    Sig: TAKE 1 TABLET BY MOUTH TWICE A DAY WITH A MEAL    Dispense:  180 tablet    Refill:  3   omeprazole (PRILOSEC) 20 MG capsule    Sig: TAKE 1 CAPSULE BY MOUTH EVERY DAY    Dispense:  90 capsule    Refill:  3   pioglitazone (ACTOS) 45 MG tablet    Sig: Take 1 tablet (45 mg total) by mouth daily.    Dispense:  90 tablet    Refill:  3   potassium chloride SA (KLOR-CON M20) 20 MEQ tablet    Sig: Take 1 tablet (20 mEq total) by mouth daily.    Dispense:  90 tablet    Refill:  3   traMADol (ULTRAM) 50 MG tablet    Sig: TAKE 1 TABLET BY MOUTH EVERY 8 HOURS AS NEEDED FOR SEVERE PAIN.    Dispense:  90 tablet    Refill:  2    This request is for a new prescription for a controlled substance as required by Federal/State law..      Follow-up: Return in about 3 months (around 07/17/2022) for a follow-up visit.  Sonda Primes, MD

## 2022-04-16 NOTE — Assessment & Plan Note (Signed)
Cont w/ Metformin, glipizide and Actos. On Lipitor now

## 2022-06-20 ENCOUNTER — Other Ambulatory Visit (HOSPITAL_COMMUNITY): Payer: Medicare Other

## 2022-07-16 ENCOUNTER — Other Ambulatory Visit: Payer: Self-pay | Admitting: Internal Medicine

## 2022-07-16 ENCOUNTER — Other Ambulatory Visit (INDEPENDENT_AMBULATORY_CARE_PROVIDER_SITE_OTHER): Payer: Medicare Other

## 2022-07-16 DIAGNOSIS — E1159 Type 2 diabetes mellitus with other circulatory complications: Secondary | ICD-10-CM

## 2022-07-16 LAB — COMPREHENSIVE METABOLIC PANEL
ALT: 17 U/L (ref 0–53)
AST: 26 U/L (ref 0–37)
Albumin: 4 g/dL (ref 3.5–5.2)
Alkaline Phosphatase: 82 U/L (ref 39–117)
BUN: 20 mg/dL (ref 6–23)
CO2: 26 mEq/L (ref 19–32)
Calcium: 9.8 mg/dL (ref 8.4–10.5)
Chloride: 99 mEq/L (ref 96–112)
Creatinine, Ser: 1.04 mg/dL (ref 0.40–1.50)
GFR: 67.61 mL/min (ref >60.00)
Glucose, Bld: 60 mg/dL — ABNORMAL LOW (ref 70–99)
Potassium: 3.9 mEq/L (ref 3.5–5.1)
Sodium: 136 mEq/L (ref 135–145)
Total Bilirubin: 0.6 mg/dL (ref 0.2–1.2)
Total Protein: 7.3 g/dL (ref 6.0–8.3)

## 2022-07-16 LAB — HEMOGLOBIN A1C: Hgb A1c MFr Bld: 7.6 % — ABNORMAL HIGH (ref 4.6–6.5)

## 2022-07-18 ENCOUNTER — Encounter: Payer: Self-pay | Admitting: Internal Medicine

## 2022-07-18 ENCOUNTER — Ambulatory Visit (INDEPENDENT_AMBULATORY_CARE_PROVIDER_SITE_OTHER): Payer: Medicare Other | Admitting: Internal Medicine

## 2022-07-18 VITALS — BP 138/80 | HR 86 | Temp 99.0°F | Ht 70.0 in | Wt 222.2 lb

## 2022-07-18 DIAGNOSIS — I6523 Occlusion and stenosis of bilateral carotid arteries: Secondary | ICD-10-CM

## 2022-07-18 DIAGNOSIS — E782 Mixed hyperlipidemia: Secondary | ICD-10-CM

## 2022-07-18 DIAGNOSIS — I10 Essential (primary) hypertension: Secondary | ICD-10-CM

## 2022-07-18 DIAGNOSIS — N320 Bladder-neck obstruction: Secondary | ICD-10-CM

## 2022-07-18 DIAGNOSIS — E1159 Type 2 diabetes mellitus with other circulatory complications: Secondary | ICD-10-CM | POA: Diagnosis not present

## 2022-07-18 DIAGNOSIS — M1991 Primary osteoarthritis, unspecified site: Secondary | ICD-10-CM | POA: Diagnosis not present

## 2022-07-18 DIAGNOSIS — M1990 Unspecified osteoarthritis, unspecified site: Secondary | ICD-10-CM | POA: Insufficient documentation

## 2022-07-18 MED ORDER — METFORMIN HCL 1000 MG PO TABS
ORAL_TABLET | ORAL | 3 refills | Status: DC
Start: 2022-07-18 — End: 2023-08-11

## 2022-07-18 MED ORDER — TRAMADOL HCL 50 MG PO TABS: ORAL_TABLET | ORAL | 2 refills | Status: DC

## 2022-07-18 MED ORDER — OMEPRAZOLE 20 MG PO CPDR
DELAYED_RELEASE_CAPSULE | ORAL | 3 refills | Status: DC
Start: 2022-07-18 — End: 2023-08-12

## 2022-07-18 MED ORDER — GLIPIZIDE ER 10 MG PO TB24: 10.0000 mg | ORAL_TABLET | Freq: Two times a day (BID) | ORAL | 3 refills | Status: DC

## 2022-07-18 MED ORDER — POTASSIUM CHLORIDE CRYS ER 20 MEQ PO TBCR
20.0000 meq | EXTENDED_RELEASE_TABLET | Freq: Every day | ORAL | 3 refills | Status: DC
Start: 2022-07-18 — End: 2023-08-12

## 2022-07-18 MED ORDER — ATORVASTATIN CALCIUM 40 MG PO TABS
40.0000 mg | ORAL_TABLET | Freq: Every day | ORAL | 3 refills | Status: DC
Start: 2022-07-18 — End: 2023-08-12

## 2022-07-18 MED ORDER — DIAZEPAM 5 MG PO TABS: 5.0000 mg | ORAL_TABLET | Freq: Once | ORAL | 0 refills | Status: DC | PRN

## 2022-07-18 MED ORDER — AMLODIPINE BESYLATE 10 MG PO TABS: 10.0000 mg | ORAL_TABLET | Freq: Every day | ORAL | 3 refills | Status: DC

## 2022-07-18 MED ORDER — HYDROCHLOROTHIAZIDE 25 MG PO TABS: 25.0000 mg | ORAL_TABLET | Freq: Every day | ORAL | 3 refills | Status: DC

## 2022-07-18 MED ORDER — PIOGLITAZONE HCL 45 MG PO TABS: 45.0000 mg | ORAL_TABLET | Freq: Every day | ORAL | 3 refills | Status: DC

## 2022-07-18 NOTE — Assessment & Plan Note (Signed)
Worse Cont w/ Metformin, glipizide and Actos. On Lipitor now Loose a few lbs Cut back on [employer] liquor, chips and salsa

## 2022-07-18 NOTE — Progress Notes (Signed)
Subjective:  Patient ID: Kevin Weaver, male    DOB: 1941/05/20  Age: 81 y.o. MRN: [id number]  CC: Follow-up (3 month f/u)   HPI Kevin Weaver presents for HTN, DM, OA  Outpatient Medications Prior to Visit  Medication Sig Dispense Refill   Cholecalciferol (VITAMIN D3) 50 MCG (2000 UT) capsule Take 1 capsule (2,000 Units total) by mouth daily. 100 capsule 3   Cyanocobalamin (B-12) 5000 MCG CAPS Take by mouth daily with breakfast.     diclofenac Sodium (VOLTAREN) 1 % GEL Apply 1 application topically 4 (four) times daily. Apply to painful joints 100 g 3   Zinc 50 MG CAPS Take by mouth at bedtime.     amLODipine (NORVASC) 10 MG tablet Take 1 tablet (10 mg total) by mouth daily. 90 tablet 3   atorvastatin (LIPITOR) 40 MG tablet Take 1 tablet (40 mg total) by mouth daily. 90 tablet 3   diazepam (VALIUM) 5 MG tablet Take 1 tablet (5 mg total) by mouth Once PRN for up to 1 dose for anxiety (Take one hour before MRI.). 1 tablet 0   glipiZIDE (GLUCOTROL XL) 10 MG 24 hr tablet Take 1 tablet (10 mg total) by mouth 2 (two) times daily. 180 tablet 3   hydrochlorothiazide (HYDRODIURIL) 25 MG tablet Take 1 tablet (25 mg total) by mouth daily. 90 tablet 3   metFORMIN (GLUCOPHAGE) 1000 MG tablet TAKE 1 TABLET BY MOUTH TWICE A DAY WITH A MEAL 180 tablet 3   omeprazole (PRILOSEC) 20 MG capsule TAKE 1 CAPSULE BY MOUTH EVERY DAY 90 capsule 3   pioglitazone (ACTOS) 45 MG tablet Take 1 tablet (45 mg total) by mouth daily. 90 tablet 3   potassium chloride SA (KLOR-CON M20) 20 MEQ tablet Take 1 tablet (20 mEq total) by mouth daily. 90 tablet 3   traMADol (ULTRAM) 50 MG tablet TAKE 1 TABLET BY MOUTH EVERY 8 HOURS AS NEEDED FOR SEVERE PAIN. 90 tablet 2   No facility-administered medications prior to visit.    ROS: Review of Systems  Constitutional:  Negative for appetite change, fatigue and unexpected weight change.  HENT:  Negative for congestion, nosebleeds, sneezing, sore throat and trouble swallowing.    Eyes:  Negative for itching and visual disturbance.  Respiratory:  Negative for cough, chest tightness and shortness of breath.   Cardiovascular:  Negative for chest pain, palpitations and leg swelling.  Gastrointestinal:  Negative for abdominal distention, blood in stool, diarrhea and nausea.  Genitourinary:  Negative for frequency and hematuria.  Musculoskeletal:  Positive for arthralgias, back pain and gait problem. Negative for joint swelling and neck pain.  Skin:  Negative for rash.  Neurological:  Negative for dizziness, tremors, speech difficulty and weakness.  Psychiatric/Behavioral:  Negative for agitation, dysphoric mood and sleep disturbance. The patient is not nervous/anxious.     Objective:  BP 138/80 (BP Location: Left Arm)   Pulse 86   Temp 99 F (37.2 C) (Oral)   Ht 5\' 10"  (1.778 m)   Wt 222 lb 3.2 oz (100.8 kg)   SpO2 97%   BMI 31.88 kg/m   BP Readings from Last 3 Encounters:  07/18/22 138/80  04/16/22 120/70  03/06/22 107/69    Wt Readings from Last 3 Encounters:  07/18/22 222 lb 3.2 oz (100.8 kg)  04/16/22 220 lb (99.8 kg)  03/29/22 218 lb (98.9 kg)    Physical Exam Constitutional:      General: He is not in acute distress.  Appearance: He is well-developed. He is obese.     Comments: NAD  Eyes:     Conjunctiva/sclera: Conjunctivae normal.     Pupils: Pupils are equal, round, and reactive to light.  Neck:     Thyroid: No thyromegaly.     Vascular: No JVD.  Cardiovascular:     Rate and Rhythm: Normal rate and regular rhythm.     Heart sounds: Normal heart sounds. No murmur heard.    No friction rub. No gallop.  Pulmonary:     Effort: Pulmonary effort is normal. No respiratory distress.     Breath sounds: Normal breath sounds. No wheezing or rales.  Chest:     Chest wall: No tenderness.  Abdominal:     General: Bowel sounds are normal. There is no distension.     Palpations: Abdomen is soft. There is no mass.     Tenderness: There is no  abdominal tenderness. There is no guarding or rebound.  Musculoskeletal:        General: No tenderness. Normal range of motion.     Cervical back: Normal range of motion.  Lymphadenopathy:     Cervical: No cervical adenopathy.  Skin:    General: Skin is warm and dry.     Findings: No rash.  Neurological:     Mental Status: He is alert and oriented to person, place, and time.     Cranial Nerves: No cranial nerve deficit.     Motor: No abnormal muscle tone.     Coordination: Coordination normal.     Gait: Gait abnormal.     Deep Tendon Reflexes: Reflexes are normal and symmetric.  Psychiatric:        Behavior: Behavior normal.        Thought Content: Thought content normal.        Judgment: Judgment normal.     Lab Results  Component Value Date   WBC 5.5 04/05/2022   HGB 11.5 (L) 04/05/2022   HCT 34.1 (L) 04/05/2022   PLT 226 04/05/2022   GLUCOSE 60 (L) 07/16/2022   CHOL 122 04/16/2022   TRIG 64.0 04/16/2022   HDL 48.70 04/16/2022   LDLCALC 60 04/16/2022   ALT 17 07/16/2022   AST 26 07/16/2022   NA 136 07/16/2022   K 3.9 07/16/2022   CL 99 07/16/2022   CREATININE 1.04 07/16/2022   BUN 20 07/16/2022   CO2 26 07/16/2022   TSH 1.82 12/18/2020   PSA 0.41 12/18/2020   HGBA1C 7.6 (H) 07/16/2022   MICROALBUR <0.7 12/18/2020    VAS US CAROTID  Result Date: 03/30/2022 Carotid Arterial Duplex Study Patient Name:  Kevin Weaver  Date of Exam:   03/28/2022 Medical Rec #: [id number]       Accession #:    [id number] Date of Birth: September 22, 1941      Patient Gender: M Patient Age:   74 years Exam Location:  [facility] Procedure:      VAS US CAROTID Referring Phys: Tressia Miners TURNER --------------------------------------------------------------------------------  Indications:       Carotid artery stenosis. Patient denies cerebrovascular                    symptoms but does complain that his fingers on the left hand                    will sometimes feel cold and numb. Risk Factors:       Hypertension, Diabetes, no history of smoking. Comparison Study:  Previous carotid duplex performed 12/27/20 showed RICA                    velocities of [id number] cm/sec and LICA velocities of 0000000                    cm/sec. Performing Technologist: Mariane Masters RVT  Examination Guidelines: A complete evaluation includes B-mode imaging, spectral Doppler, color Doppler, and power Doppler as needed of all accessible portions of each vessel. Bilateral testing is considered an integral part of a complete examination. Limited examinations for reoccurring indications may be performed as noted.  Right Carotid Findings: +----------+--------+--------+--------+------------------+--------+           PSV cm/sEDV cm/sStenosisPlaque DescriptionComments +----------+--------+--------+--------+------------------+--------+ CCA Prox  52      11                                         +----------+--------+--------+--------+------------------+--------+ CCA Distal62      16                                         +----------+--------+--------+--------+------------------+--------+ ICA Prox  63      21              heterogenous               +----------+--------+--------+--------+------------------+--------+ ICA Mid   67      21      1-39%                              +----------+--------+--------+--------+------------------+--------+ ICA Distal71      32                                         +----------+--------+--------+--------+------------------+--------+ ECA       54      14                                         +----------+--------+--------+--------+------------------+--------+ +----------+--------+-------+----------------+-------------------+           PSV cm/sEDV cmsDescribe        Arm Pressure (mmHG) +----------+--------+-------+----------------+-------------------+ [id number]             Multiphasic, [id number]                  +----------+--------+-------+----------------+-------------------+ +---------+--------+-+--------+-+------+ VertebralPSV cm/s0EDV cm/s0Absent +---------+--------+-+--------+-+------+  Left Carotid Findings: +----------+--------+--------+--------+-------------------------+--------+           PSV cm/sEDV cm/sStenosisPlaque Description       Comments +----------+--------+--------+--------+-------------------------+--------+ CCA Prox  66      17                                                +----------+--------+--------+--------+-------------------------+--------+ CCA Distal55      16                                                +----------+--------+--------+--------+-------------------------+--------+  ICA Prox  133     49              heterogenous                      +----------+--------+--------+--------+-------------------------+--------+ ICA Mid   166     55      40-59%  irregular and hyperechoic         +----------+--------+--------+--------+-------------------------+--------+ ICA Distal43      15                                                +----------+--------+--------+--------+-------------------------+--------+ ECA       47      12                                                +----------+--------+--------+--------+-------------------------+--------+ +----------+--------+--------+----------------+-------------------+           PSV cm/sEDV cm/sDescribe        Arm Pressure (mmHG) +----------+--------+--------+----------------+-------------------+ Subclavian80      0       Multiphasic, [id number]                 +----------+--------+--------+----------------+-------------------+ +---------+--------+--+--------+--+---------+ VertebralPSV cm/s42EDV cm/s17Antegrade +---------+--------+--+--------+--+---------+   Summary: Right Carotid: Velocities in the right ICA are consistent with a 1-39% stenosis. Left Carotid: Velocities in the left ICA  are consistent with a 40-59% stenosis. Vertebrals:  Left vertebral artery demonstrates antegrade flow. Right vertebral              artery demonstrates an occlusion. Subclavians: Normal flow hemodynamics were seen in bilateral subclavian              arteries. *See table(s) above for measurements and observations. Suggest follow up study in 12 months. Electronically signed by Carlyle Dolly MD on 03/30/2022 at 3:45:36 PM.    Final    MYOCARDIAL PERFUSION IMAGING  Result Date: 03/29/2022   The study is normal. Findings are consistent with no prior ischemia and no prior myocardial infarction. The study is low risk.   No ST deviation was noted.   LV perfusion is normal. There is no evidence of ischemia. There is no evidence of infarction.   Left ventricular function is normal. Nuclear stress EF: 64 %. The left ventricular ejection fraction is normal (55-65%). End diastolic cavity size is normal. End systolic cavity size is normal.   Prior study available for comparison from 03/16/2009.   ECHOCARDIOGRAM COMPLETE  Result Date: 03/29/2022    ECHOCARDIOGRAM REPORT   Patient Name:   Kevin Weaver Date of Exam: 03/29/2022 Medical Rec #:  [id number]      Height:       70.0 in Accession #:    [id number]     Weight:       218.0 lb Date of Birth:  09/28/1941     BSA:          2.165 m Patient Age:    40 years       BP:           107/69 mmHg Patient Gender: M              HR:  57 bpm. Exam Location:  [address] Procedure: 2D Echo, Cardiac Doppler, Color Doppler and Intracardiac            Opacification Agent Indications:    R01.1 Murmur  History:        Patient has no prior history of Echocardiogram examinations.                 Signs/Symptoms:Murmur; Risk Factors:Hypertension, Dyslipidemia                 and Diabetes.  Sonographer:    Coralyn Helling RDCS Referring Phys: [phone] TRACI R TURNER  Sonographer Comments: Technically difficult study due to poor echo windows. IMPRESSIONS  1. Left ventricular ejection  fraction, by estimation, is 60 to 65%. The left ventricle has normal function. The left ventricle has no regional wall motion abnormalities. 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 Left ventricular diastolic parameters are consistent with Grade I diastolic dysfunction (impaired relaxation).  2. Right ventricular systolic function is normal. The right ventricular size is mildly enlarged. There is normal pulmonary artery systolic pressure. The estimated right ventricular systolic pressure is [id number] mmHg.  3. Left atrial size was mildly dilated.  4. The mitral valve is normal in structure. Mild mitral valve regurgitation. No evidence of mitral stenosis.  5. Tricuspid valve regurgitation is moderate.  6. The aortic valve is normal in structure. There is mild calcification of the aortic valve. There is mild thickening of the aortic valve. Aortic valve regurgitation is not visualized. Moderate aortic valve stenosis. Aortic valve area, by VTI measures 1.34 cm. Aortic valve mean gradient measures 18.0 mmHg. Aortic valve Vmax measures 2.92 m/s.  7. Aortic dilatation noted. There is moderate dilatation of the ascending aorta, measuring 45 mm.  8. The inferior vena cava is normal in size with greater than 50% respiratory variability, suggesting right atrial pressure of 3 mmHg. FINDINGS  Left Ventricle: Left ventricular ejection fraction, by estimation, is 60 to 65%. The left ventricle has normal function. The left ventricle has no regional wall motion abnormalities. Definity contrast agent was given IV to delineate the left ventricular  endocardial borders. The left ventricular internal cavity size was normal in size. 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 Left ventricular diastolic parameters are consistent with Grade I diastolic dysfunction (impaired relaxation). Right Ventricle: The right ventricular size is mildly enlarged. No  increase in right ventricular wall thickness. Right ventricular systolic function is normal. There is normal pulmonary artery systolic pressure. The tricuspid regurgitant velocity is 2.28  m/s, and with an assumed right atrial pressure of 3 mmHg, the estimated right ventricular systolic pressure is [id number] mmHg. Left Atrium: Left atrial size was mildly dilated. Right Atrium: Right atrial size was normal in size. Pericardium: There is no evidence of pericardial effusion. Mitral Valve: The mitral valve is normal in structure. Mild mitral valve regurgitation. No evidence of mitral valve stenosis. Tricuspid Valve: The tricuspid valve is normal in structure. Tricuspid valve regurgitation is moderate . No evidence of tricuspid stenosis. Aortic Valve: The aortic valve is normal in structure. There is mild calcification of the aortic valve. There is mild thickening of the aortic valve. Aortic valve regurgitation is not visualized. Moderate aortic stenosis is present. Aortic valve mean gradient measures 18.0 mmHg. Aortic valve peak gradient measures 34.2 mmHg. Aortic valve area, by VTI measures 1.34 cm. Pulmonic Valve: The pulmonic valve was normal in structure. Pulmonic valve regurgitation is not visualized. No evidence  of pulmonic stenosis. Aorta: The aortic root is normal in size and structure and aortic dilatation noted. There is moderate dilatation of the ascending aorta, measuring 45 mm. Venous: The inferior vena cava is normal in size with greater than 50% respiratory variability, suggesting right atrial pressure of 3 mmHg. IAS/Shunts: No atrial level shunt detected by color flow Doppler.  LEFT VENTRICLE PLAX 2D LVIDd:         4.90 cm   Diastology LVIDs:         2.90 cm   LV e' medial:    5.44 cm/s LV PW:         1.10 cm   LV E/e' medial:  17.3 LV IVS:        1.60 cm   LV e' lateral:   9.79 cm/s LVOT diam:     2.30 cm   LV E/e' lateral: 9.6 LV SV:         93 LV SV Index:   43 LVOT Area:     4.15 cm  RIGHT VENTRICLE  RVSP:           23.8 mmHg LEFT ATRIUM             Index        RIGHT ATRIUM           Index LA diam:        5.50 cm 2.54 cm/m   RA Pressure: 3.00 mmHg LA Vol (A2C):   59.7 ml 27.57 ml/m  RA Area:     14.60 cm LA Vol (A4C):   83.7 ml 38.66 ml/m  RA Volume:   28.80 ml  13.30 ml/m LA Biplane Vol: 73.0 ml 33.72 ml/m  AORTIC VALVE AV Area (Vmax):    1.43 cm AV Area (Vmean):   1.45 cm AV Area (VTI):     1.34 cm AV Vmax:           292.50 cm/s AV Vmean:          196.500 cm/s AV VTI:            0.692 m AV Peak Grad:      34.2 mmHg AV Mean Grad:      18.0 mmHg LVOT Vmax:         101.00 cm/s LVOT Vmean:        68.400 cm/s LVOT VTI:          0.224 m LVOT/AV VTI ratio: 0.32  AORTA Ao Root diam: 4.10 cm Ao Asc diam:  4.50 cm MITRAL VALVE                TRICUSPID VALVE MV Area (PHT): 3.40 cm     TR Peak grad:   20.8 mmHg MV Decel Time: 223 msec     TR Vmax:        228.00 cm/s MV E velocity: 94.30 cm/s   Estimated RAP:  3.00 mmHg MV A velocity: 103.00 cm/s  RVSP:           23.8 mmHg MV E/A ratio:  0.92                             SHUNTS                             Systemic VTI:  0.22 m  Systemic Diam: 2.30 cm Candee Furbish MD Electronically signed by Candee Furbish MD Signature Date/Time: 03/29/2022/1:34:26 PM    Final     Assessment & Plan:   Problem List Items Addressed This Visit     Carotid stenosis, bilateral     On Lipitor and krill oil      Relevant Medications   amLODipine (NORVASC) 10 MG tablet   atorvastatin (LIPITOR) 40 MG tablet   hydrochlorothiazide (HYDRODIURIL) 25 MG tablet   Other Relevant Orders   Urinalysis   CBC with Differential/Platelet   DM (diabetes mellitus) ([place])    Worse Cont w/ Metformin, glipizide and Actos. On Lipitor now Loose a few lbs Cut back on [employer] liquor, chips and salsa      Relevant Medications   atorvastatin (LIPITOR) 40 MG tablet   glipiZIDE (GLUCOTROL XL) 10 MG 24 hr tablet   metFORMIN (GLUCOPHAGE) 1000 MG tablet   pioglitazone  (ACTOS) 45 MG tablet   Other Relevant Orders   TSH   Urinalysis   CBC with Differential/Platelet   Lipid panel   Comprehensive metabolic panel   Hemoglobin A1c   HTN (hypertension)    Cont w/Benazepril HCT, Norvasc 10 mg/d      Relevant Medications   amLODipine (NORVASC) 10 MG tablet   atorvastatin (LIPITOR) 40 MG tablet   hydrochlorothiazide (HYDRODIURIL) 25 MG tablet   Other Relevant Orders   Urinalysis   Hyperlipidemia     On Lipitor and krill oil      Relevant Medications   amLODipine (NORVASC) 10 MG tablet   atorvastatin (LIPITOR) 40 MG tablet   hydrochlorothiazide (HYDRODIURIL) 25 MG tablet   Other Relevant Orders   TSH   Lipid panel   Osteoarthritis    On Vit D, krill oil Tramadol prn  Potential benefits of a long term opioids use as well as potential risks (i.e. addiction risk, apnea etc) and complications (i.e. Somnolence, constipation and others) were explained to the patient and were aknowledged.       Relevant Medications   traMADol (ULTRAM) 50 MG tablet   Other Visit Diagnoses     Bladder neck obstruction    -  Primary   Relevant Orders   CBC with Differential/Platelet   PSA         Meds ordered this encounter  Medications   amLODipine (NORVASC) 10 MG tablet    Sig: Take 1 tablet (10 mg total) by mouth daily.    Dispense:  90 tablet    Refill:  3   atorvastatin (LIPITOR) 40 MG tablet    Sig: Take 1 tablet (40 mg total) by mouth daily.    Dispense:  90 tablet    Refill:  3   diazepam (VALIUM) 5 MG tablet    Sig: Take 1 tablet (5 mg total) by mouth Once PRN for up to 1 dose for anxiety (Take one hour before MRI.).    Dispense:  1 tablet    Refill:  0   glipiZIDE (GLUCOTROL XL) 10 MG 24 hr tablet    Sig: Take 1 tablet (10 mg total) by mouth 2 (two) times daily.    Dispense:  180 tablet    Refill:  3   hydrochlorothiazide (HYDRODIURIL) 25 MG tablet    Sig: Take 1 tablet (25 mg total) by mouth daily.    Dispense:  90 tablet    Refill:  3    metFORMIN (GLUCOPHAGE) 1000 MG tablet    Sig: TAKE 1 TABLET BY MOUTH  TWICE A DAY WITH A MEAL    Dispense:  180 tablet    Refill:  3   omeprazole (PRILOSEC) 20 MG capsule    Sig: TAKE 1 CAPSULE BY MOUTH EVERY DAY    Dispense:  90 capsule    Refill:  3   pioglitazone (ACTOS) 45 MG tablet    Sig: Take 1 tablet (45 mg total) by mouth daily.    Dispense:  90 tablet    Refill:  3   potassium chloride SA (KLOR-CON M20) 20 MEQ tablet    Sig: Take 1 tablet (20 mEq total) by mouth daily.    Dispense:  90 tablet    Refill:  3   traMADol (ULTRAM) 50 MG tablet    Sig: TAKE 1 TABLET BY MOUTH EVERY 8 HOURS AS NEEDED FOR SEVERE PAIN.    Dispense:  90 tablet    Refill:  2    This request is for a new prescription for a controlled substance as required by Federal/State law..      Follow-up: Return in about 3 months (around 10/17/2022) for a follow-up visit.  Walker Kehr, MD

## 2022-07-18 NOTE — Assessment & Plan Note (Signed)
On Vit D, krill oil Tramadol prn  Potential benefits of a long term opioids use as well as potential risks (i.e. addiction risk, apnea etc) and complications (i.e. Somnolence, constipation and others) were explained to the patient and were aknowledged.

## 2022-07-18 NOTE — Assessment & Plan Note (Signed)
Cont w/Benazepril HCT, Norvasc 10 mg/d

## 2022-07-18 NOTE — Assessment & Plan Note (Signed)
On Lipitor and krill oil

## 2022-07-18 NOTE — Assessment & Plan Note (Addendum)
On Lipitor and krill oil 

## 2022-10-17 ENCOUNTER — Ambulatory Visit (INDEPENDENT_AMBULATORY_CARE_PROVIDER_SITE_OTHER): Payer: Medicare Other | Admitting: Internal Medicine

## 2022-10-17 ENCOUNTER — Ambulatory Visit (INDEPENDENT_AMBULATORY_CARE_PROVIDER_SITE_OTHER): Payer: Medicare Other

## 2022-10-17 ENCOUNTER — Encounter: Payer: Self-pay | Admitting: Internal Medicine

## 2022-10-17 VITALS — BP 162/80 | HR 77 | Temp 98.0°F | Ht 70.0 in | Wt 220.8 lb

## 2022-10-17 DIAGNOSIS — M1991 Primary osteoarthritis, unspecified site: Secondary | ICD-10-CM | POA: Diagnosis not present

## 2022-10-17 DIAGNOSIS — E1159 Type 2 diabetes mellitus with other circulatory complications: Secondary | ICD-10-CM | POA: Diagnosis not present

## 2022-10-17 DIAGNOSIS — E782 Mixed hyperlipidemia: Secondary | ICD-10-CM

## 2022-10-17 DIAGNOSIS — R053 Chronic cough: Secondary | ICD-10-CM

## 2022-10-17 DIAGNOSIS — N320 Bladder-neck obstruction: Secondary | ICD-10-CM

## 2022-10-17 DIAGNOSIS — I6523 Occlusion and stenosis of bilateral carotid arteries: Secondary | ICD-10-CM | POA: Diagnosis not present

## 2022-10-17 DIAGNOSIS — R059 Cough, unspecified: Secondary | ICD-10-CM | POA: Diagnosis not present

## 2022-10-17 DIAGNOSIS — I10 Essential (primary) hypertension: Secondary | ICD-10-CM | POA: Diagnosis not present

## 2022-10-17 DIAGNOSIS — J9811 Atelectasis: Secondary | ICD-10-CM | POA: Diagnosis not present

## 2022-10-17 LAB — COMPREHENSIVE METABOLIC PANEL
ALT: 17 U/L (ref 0–53)
AST: 19 U/L (ref 0–37)
Albumin: 4.3 g/dL (ref 3.5–5.2)
Alkaline Phosphatase: 75 U/L (ref 39–117)
BUN: 31 mg/dL — ABNORMAL HIGH (ref 6–23)
CO2: 27 mEq/L (ref 19–32)
Calcium: 10.1 mg/dL (ref 8.4–10.5)
Chloride: 99 mEq/L (ref 96–112)
Creatinine, Ser: 1.14 mg/dL (ref 0.40–1.50)
GFR: 60.45 mL/min (ref >60.00)
Glucose, Bld: 113 mg/dL — ABNORMAL HIGH (ref 70–99)
Potassium: 3.7 mEq/L (ref 3.5–5.1)
Sodium: 137 mEq/L (ref 135–145)
Total Bilirubin: 0.7 mg/dL (ref 0.2–1.2)
Total Protein: 7.3 g/dL (ref 6.0–8.3)

## 2022-10-17 LAB — URINALYSIS
Bilirubin Urine: NEGATIVE
Hgb urine dipstick: NEGATIVE
Ketones, ur: NEGATIVE
Leukocytes,Ua: NEGATIVE
Nitrite: NEGATIVE
Specific Gravity, Urine: 1.015 (ref 1.000–1.030)
Total Protein, Urine: NEGATIVE
Urine Glucose: NEGATIVE
Urobilinogen, UA: 0.2 (ref 0.0–1.0)
pH: 6 (ref 5.0–8.0)

## 2022-10-17 LAB — LIPID PANEL
Cholesterol: 130 mg/dL (ref 0–200)
HDL: 63 mg/dL (ref >39.00)
LDL Cholesterol: 55 mg/dL (ref 0–99)
NonHDL: 66.97
Total CHOL/HDL Ratio: 2
Triglycerides: 59 mg/dL (ref 0.0–149.0)
VLDL: 11.8 mg/dL (ref 0.0–40.0)

## 2022-10-17 LAB — CBC WITH DIFFERENTIAL/PLATELET
Basophils Absolute: 0.1 10*3/uL (ref 0.0–0.1)
Basophils Relative: 0.9 % (ref 0.0–3.0)
Eosinophils Absolute: 0.1 10*3/uL (ref 0.0–0.7)
Eosinophils Relative: 1.4 % (ref 0.0–5.0)
HCT: 37.7 % — ABNORMAL LOW (ref 39.0–52.0)
Hemoglobin: 12.2 g/dL — ABNORMAL LOW (ref 13.0–17.0)
Lymphocytes Relative: 29 % (ref 12.0–46.0)
Lymphs Abs: 2 10*3/uL (ref 0.7–4.0)
MCHC: 32.4 g/dL (ref 30.0–36.0)
MCV: 90.1 fl (ref 78.0–100.0)
Monocytes Absolute: 0.9 10*3/uL (ref 0.1–1.0)
Monocytes Relative: 12.7 % — ABNORMAL HIGH (ref 3.0–12.0)
Neutro Abs: 3.9 10*3/uL (ref 1.4–7.7)
Neutrophils Relative %: 56 % (ref 43.0–77.0)
Platelets: 259 10*3/uL (ref 150.0–400.0)
RBC: 4.18 Mil/uL — ABNORMAL LOW (ref 4.22–5.81)
RDW: 16.9 % — ABNORMAL HIGH (ref 11.5–15.5)
WBC: 7.1 10*3/uL (ref 4.0–10.5)

## 2022-10-17 LAB — PSA: PSA: 0.32 ng/mL (ref 0.10–4.00)

## 2022-10-17 LAB — TSH: TSH: 1.92 u[IU]/mL (ref 0.35–5.50)

## 2022-10-17 LAB — HEMOGLOBIN A1C: Hgb A1c MFr Bld: 8 % — ABNORMAL HIGH (ref 4.6–6.5)

## 2022-10-17 MED ORDER — CEFDINIR 300 MG PO CAPS: 300.0000 mg | ORAL_CAPSULE | Freq: Two times a day (BID) | ORAL | 0 refills | Status: DC

## 2022-10-17 NOTE — Progress Notes (Signed)
Subjective:  Patient ID: Kevin Weaver, male    DOB: [birth year]  Age: 81 y.o. MRN: [id number]  CC: Follow-up (3 month f/u)   HPI ANYELO MCCUE presents for cough x 12 months, bringing up mucus x 1 year. F/u HTN, DM, OA  Outpatient Medications Prior to Visit  Medication Sig Dispense Refill   amLODipine (NORVASC) 10 MG tablet Take 1 tablet (10 mg total) by mouth daily. 90 tablet 3   atorvastatin (LIPITOR) 40 MG tablet Take 1 tablet (40 mg total) by mouth daily. 90 tablet 3   Cholecalciferol (VITAMIN D3) 50 MCG (2000 UT) capsule Take 1 capsule (2,000 Units total) by mouth daily. 100 capsule 3   Cyanocobalamin (B-12) 5000 MCG CAPS Take by mouth daily with breakfast.     diazepam (VALIUM) 5 MG tablet Take 1 tablet (5 mg total) by mouth Once PRN for up to 1 dose for anxiety (Take one hour before MRI.). 1 tablet 0   glipiZIDE (GLUCOTROL XL) 10 MG 24 hr tablet Take 1 tablet (10 mg total) by mouth 2 (two) times daily. 180 tablet 3   hydrochlorothiazide (HYDRODIURIL) 25 MG tablet Take 1 tablet (25 mg total) by mouth daily. 90 tablet 3   metFORMIN (GLUCOPHAGE) 1000 MG tablet TAKE 1 TABLET BY MOUTH TWICE A DAY WITH A MEAL 180 tablet 3   omeprazole (PRILOSEC) 20 MG capsule TAKE 1 CAPSULE BY MOUTH EVERY DAY 90 capsule 3   pioglitazone (ACTOS) 45 MG tablet Take 1 tablet (45 mg total) by mouth daily. 90 tablet 3   potassium chloride SA (KLOR-CON M20) 20 MEQ tablet Take 1 tablet (20 mEq total) by mouth daily. 90 tablet 3   traMADol (ULTRAM) 50 MG tablet TAKE 1 TABLET BY MOUTH EVERY 8 HOURS AS NEEDED FOR SEVERE PAIN. 90 tablet 2   Zinc 50 MG CAPS Take by mouth at bedtime.     diclofenac Sodium (VOLTAREN) 1 % GEL Apply 1 application topically 4 (four) times daily. Apply to painful joints (Patient not taking: Reported on 10/17/2022) 100 g 3   No facility-administered medications prior to visit.    ROS: Review of Systems  Constitutional:  Negative for appetite change, fatigue and unexpected weight  change.  HENT:  Negative for congestion, nosebleeds, sneezing, sore throat and trouble swallowing.   Eyes:  Negative for itching and visual disturbance.  Respiratory:  Positive for cough. Negative for shortness of breath.   Cardiovascular:  Negative for chest pain, palpitations and leg swelling.  Gastrointestinal:  Negative for abdominal distention, blood in stool, diarrhea and nausea.  Genitourinary:  Negative for frequency and hematuria.  Musculoskeletal:  Positive for arthralgias. Negative for back pain, gait problem, joint swelling and neck pain.  Skin:  Negative for rash.  Neurological:  Negative for dizziness, tremors, speech difficulty and weakness.  Psychiatric/Behavioral:  Negative for agitation, dysphoric mood, sleep disturbance and suicidal ideas. The patient is nervous/anxious.     Objective:  BP (!) 162/80 (BP Location: Left Arm)   Pulse 77   Temp 98 F (36.7 C) (Oral)   Ht  (1.778 m)   Wt 220 lb 12.8 oz (100.2 kg)   SpO2 96%   BMI 31.68 kg/m   BP Readings from Last 3 Encounters:  10/17/22 (!) 162/80  07/18/22 138/80  04/16/22 120/70    Wt Readings from Last 3 Encounters:  10/17/22 220 lb 12.8 oz (100.2 kg)  07/18/22 222 lb 3.2 oz (100.8 kg)  04/16/22 220 lb (99.8 kg)  Physical Exam Constitutional:      General: He is not in acute distress.    Appearance: He is well-developed.     Comments: NAD  Eyes:     Conjunctiva/sclera: Conjunctivae normal.     Pupils: Pupils are equal, round, and reactive to light.  Neck:     Thyroid: No thyromegaly.     Vascular: No JVD.  Cardiovascular:     Rate and Rhythm: Normal rate and regular rhythm.     Heart sounds: Normal heart sounds. No murmur heard.    No friction rub. No gallop.  Pulmonary:     Effort: Pulmonary effort is normal. No respiratory distress.     Breath sounds: Normal breath sounds. No wheezing or rales.  Chest:     Chest wall: No tenderness.  Abdominal:     General: Bowel sounds are normal.  There is no distension.     Palpations: Abdomen is soft. There is no mass.     Tenderness: There is no abdominal tenderness. There is no guarding or rebound.  Musculoskeletal:        General: No tenderness. Normal range of motion.     Cervical back: Normal range of motion.  Lymphadenopathy:     Cervical: No cervical adenopathy.  Skin:    General: Skin is warm and dry.     Findings: No rash.  Neurological:     Mental Status: He is alert and oriented to person, place, and time.     Cranial Nerves: No cranial nerve deficit.     Motor: No abnormal muscle tone.     Coordination: Coordination normal.     Gait: Gait normal.     Deep Tendon Reflexes: Reflexes are normal and symmetric.  Psychiatric:        Behavior: Behavior normal.        Thought Content: Thought content normal.        Judgment: Judgment normal.   Gray mucus Fingers R hand, toes - amputated Arthritic gait  Lab Results  Component Value Date   WBC 5.5 04/05/2022   HGB 11.5 (L) 04/05/2022   HCT 34.1 (L) 04/05/2022   PLT 226 04/05/2022   GLUCOSE 60 (L) 07/16/2022   CHOL 122 04/16/2022   TRIG 64.0 04/16/2022   HDL 48.70 04/16/2022   LDLCALC 60 04/16/2022   ALT 17 07/16/2022   AST 26 07/16/2022   NA 136 07/16/2022   K 3.9 07/16/2022   CL 99 07/16/2022   CREATININE 1.04 07/16/2022   BUN 20 07/16/2022   CO2 26 07/16/2022   TSH 1.82 12/18/2020   PSA 0.41 12/18/2020   HGBA1C 7.6 (H) 07/16/2022   MICROALBUR <0.7 12/18/2020    VAS US CAROTID  Result Date: 03/30/2022 Carotid Arterial Duplex Study Patient Name:  Kevin Weaver  Date of Exam:   03/28/2022 Medical Rec #: [id number]       Accession #:    [id number] Date of Birth: 08/14/1941      Patient Gender: M Patient Age:   15 years Exam Location:  [facility] Procedure:      VAS US CAROTID Referring Phys: Gloris Manchester TURNER --------------------------------------------------------------------------------  Indications:       Carotid artery stenosis. Patient denies cerebrovascular                     symptoms but does complain that his fingers on the left hand  will sometimes feel cold and numb. Risk Factors:      Hypertension, Diabetes, no history of smoking. Comparison Study:  Previous carotid duplex performed 12/27/20 showed RICA                    velocities of 47/21 cm/sec and LICA velocities of 150/45                    cm/sec. Performing Technologist: Olegario Shearer RVT  Examination Guidelines: A complete evaluation includes B-mode imaging, spectral Doppler, color Doppler, and power Doppler as needed of all accessible portions of each vessel. Bilateral testing is considered an integral part of a complete examination. Limited examinations for reoccurring indications may be performed as noted.  Right Carotid Findings: +----------+--------+--------+--------+------------------+--------+           PSV cm/sEDV cm/sStenosisPlaque DescriptionComments +----------+--------+--------+--------+------------------+--------+ CCA Prox  52      11                                         +----------+--------+--------+--------+------------------+--------+ CCA Distal62      16                                         +----------+--------+--------+--------+------------------+--------+ ICA Prox  63      21              heterogenous               +----------+--------+--------+--------+------------------+--------+ ICA Mid   67      21      1-39%                              +----------+--------+--------+--------+------------------+--------+ ICA Distal71      32                                         +----------+--------+--------+--------+------------------+--------+ ECA       54      14                                         +----------+--------+--------+--------+------------------+--------+ +----------+--------+-------+----------------+-------------------+           PSV cm/sEDV cmsDescribe        Arm Pressure (mmHG)  +----------+--------+-------+----------------+-------------------+ [id number]             Multiphasic, [id number]                 +----------+--------+-------+----------------+-------------------+ +---------+--------+-+--------+-+------+ VertebralPSV cm/s0EDV cm/s0Absent +---------+--------+-+--------+-+------+  Left Carotid Findings: +----------+--------+--------+--------+-------------------------+--------+           PSV cm/sEDV cm/sStenosisPlaque Description       Comments +----------+--------+--------+--------+-------------------------+--------+ CCA Prox  66      17                                                +----------+--------+--------+--------+-------------------------+--------+ CCA Distal55      16                                                +----------+--------+--------+--------+-------------------------+--------+  ICA Prox  133     49              heterogenous                      +----------+--------+--------+--------+-------------------------+--------+ ICA Mid   166     55      40-59%  irregular and hyperechoic         +----------+--------+--------+--------+-------------------------+--------+ ICA Distal43      15                                                +----------+--------+--------+--------+-------------------------+--------+ ECA       47      12                                                +----------+--------+--------+--------+-------------------------+--------+ +----------+--------+--------+----------------+-------------------+           PSV cm/sEDV cm/sDescribe        Arm Pressure (mmHG) +----------+--------+--------+----------------+-------------------+ Subclavian80      0       Multiphasic, [id number]                 +----------+--------+--------+----------------+-------------------+ +---------+--------+--+--------+--+---------+ VertebralPSV cm/s42EDV cm/s17Antegrade  +---------+--------+--+--------+--+---------+   Summary: Right Carotid: Velocities in the right ICA are consistent with a 1-39% stenosis. 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 Vertebrals:  Left vertebral artery demonstrates antegrade flow. Right vertebral              artery demonstrates an occlusion. Subclavians: Normal flow hemodynamics were seen in bilateral subclavian              arteries. *See table(s) above for measurements and observations. Suggest follow up study in 12 months. Electronically signed by Dina Rich MD on 03/30/2022 at 3:45:36 PM.    Final    MYOCARDIAL PERFUSION IMAGING  Result Date: 03/29/2022   The study is normal. Findings are consistent with no prior ischemia and no prior myocardial infarction. The study is low risk.   No ST deviation was noted.   LV perfusion is normal. There is no evidence of ischemia. There is no evidence of infarction.   Left ventricular function is normal. Nuclear stress EF: 64 %. The left ventricular ejection fraction is normal (55-65%). End diastolic cavity size is normal. End systolic cavity size is normal.   Prior study available for comparison from 03/16/2009.   ECHOCARDIOGRAM COMPLETE  Result Date: 03/29/2022    ECHOCARDIOGRAM REPORT   Patient Name:   KAZUO DURNIL Date of Exam: 03/29/2022 Medical Rec #:  [id number]      Height:       70.0 in Accession #:    [id number]     Weight:       218.0 lb Date of Birth:  1940-11-11     BSA:          2.165 m Patient Age:    80 years       BP:           107/69 mmHg Patient Gender: M              HR:  57 bpm. Exam Location:  [address] Procedure: 2D Echo, Cardiac Doppler, Color Doppler and Intracardiac            Opacification Agent Indications:    R01.1 Murmur  History:        Patient has no prior history of Echocardiogram examinations.                 Signs/Symptoms:Murmur; Risk Factors:Hypertension, Dyslipidemia                 and Diabetes.  Sonographer:     Samule Ohm RDCS Referring Phys: [phone] TRACI R TURNER  Sonographer Comments: Technically difficult study due to poor echo windows. IMPRESSIONS  1. Left ventricular ejection fraction, by estimation, is 60 to 65%. The left ventricle has normal function. The left ventricle has no regional wall motion abnormalities. 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 Left ventricular diastolic parameters are consistent with Grade I diastolic dysfunction (impaired relaxation).  2. Right ventricular systolic function is normal. The right ventricular size is mildly enlarged. There is normal pulmonary artery systolic pressure. The estimated right ventricular systolic pressure is 23.8 mmHg.  3. Left atrial size was mildly dilated.  4. The mitral valve is normal in structure. Mild mitral valve regurgitation. No evidence of mitral stenosis.  5. Tricuspid valve regurgitation is moderate.  6. The aortic valve is normal in structure. There is mild calcification of the aortic valve. There is mild thickening of the aortic valve. Aortic valve regurgitation is not visualized. Moderate aortic valve stenosis. Aortic valve area, by VTI measures 1.34 cm. Aortic valve mean gradient measures 18.0 mmHg. Aortic valve Vmax measures 2.92 m/s.  7. Aortic dilatation noted. There is moderate dilatation of the ascending aorta, measuring 45 mm.  8. The inferior vena cava is normal in size with greater than 50% respiratory variability, suggesting right atrial pressure of 3 mmHg. FINDINGS  Left Ventricle: Left ventricular ejection fraction, by estimation, is 60 to 65%. The left ventricle has normal function. The left ventricle has no regional wall motion abnormalities. Definity contrast agent was given IV to delineate the left ventricular  endocardial borders. The left ventricular internal cavity size was normal in size. 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 Left  ventricular diastolic parameters are consistent with Grade I diastolic dysfunction (impaired relaxation). Right Ventricle: The right ventricular size is mildly enlarged. No increase in right ventricular wall thickness. Right ventricular systolic function is normal. There is normal pulmonary artery systolic pressure. The tricuspid regurgitant velocity is 2.28  m/s, and with an assumed right atrial pressure of 3 mmHg, the estimated right ventricular systolic pressure is 23.8 mmHg. Left Atrium: Left atrial size was mildly dilated. Right Atrium: Right atrial size was normal in size. Pericardium: There is no evidence of pericardial effusion. Mitral Valve: The mitral valve is normal in structure. Mild mitral valve regurgitation. No evidence of mitral valve stenosis. Tricuspid Valve: The tricuspid valve is normal in structure. Tricuspid valve regurgitation is moderate . No evidence of tricuspid stenosis. Aortic Valve: The aortic valve is normal in structure. There is mild calcification of the aortic valve. There is mild thickening of the aortic valve. Aortic valve regurgitation is not visualized. Moderate aortic stenosis is present. Aortic valve mean gradient measures 18.0 mmHg. Aortic valve peak gradient measures 34.2 mmHg. Aortic valve area, by VTI measures 1.34 cm. Pulmonic Valve: The pulmonic valve was normal in structure. Pulmonic valve regurgitation is not visualized. No evidence  of pulmonic stenosis. Aorta: The aortic root is normal in size and structure and aortic dilatation noted. There is moderate dilatation of the ascending aorta, measuring 45 mm. Venous: The inferior vena cava is normal in size with greater than 50% respiratory variability, suggesting right atrial pressure of 3 mmHg. IAS/Shunts: No atrial level shunt detected by color flow Doppler.  LEFT VENTRICLE PLAX 2D LVIDd:         4.90 cm   Diastology LVIDs:         2.90 cm   LV e' medial:    5.44 cm/s LV PW:         1.10 cm   LV E/e' medial:  17.3 LV  IVS:        1.60 cm   LV e' lateral:   9.79 cm/s LVOT diam:     2.30 cm   LV E/e' lateral: 9.6 LV SV:         93 LV SV Index:   43 LVOT Area:     4.15 cm  RIGHT VENTRICLE RVSP:           23.8 mmHg LEFT ATRIUM             Index        RIGHT ATRIUM           Index LA diam:        5.50 cm 2.54 cm/m   RA Pressure: 3.00 mmHg LA Vol (A2C):   59.7 ml 27.57 ml/m  RA Area:     14.60 cm LA Vol (A4C):   83.7 ml 38.66 ml/m  RA Volume:   28.80 ml  13.30 ml/m LA Biplane Vol: 73.0 ml 33.72 ml/m  AORTIC VALVE AV Area (Vmax):    1.43 cm AV Area (Vmean):   1.45 cm AV Area (VTI):     1.34 cm AV Vmax:           292.50 cm/s AV Vmean:          196.500 cm/s AV VTI:            0.692 m AV Peak Grad:      34.2 mmHg AV Mean Grad:      18.0 mmHg LVOT Vmax:         101.00 cm/s LVOT Vmean:        68.400 cm/s LVOT VTI:          0.224 m LVOT/AV VTI ratio: 0.32  AORTA Ao Root diam: 4.10 cm Ao Asc diam:  4.50 cm MITRAL VALVE                TRICUSPID VALVE MV Area (PHT): 3.40 cm     TR Peak grad:   20.8 mmHg MV Decel Time: 223 msec     TR Vmax:        228.00 cm/s MV E velocity: 94.30 cm/s   Estimated RAP:  3.00 mmHg MV A velocity: 103.00 cm/s  RVSP:           23.8 mmHg MV E/A ratio:  0.92                             SHUNTS                             Systemic VTI:  0.22 m  Systemic Diam: 2.30 cm Donato SchultzMark Skains MD Electronically signed by Donato SchultzMark Skains MD Signature Date/Time: 03/29/2022/1:34:26 PM    Final     Assessment & Plan:   Problem List Items Addressed This Visit     Osteoarthritis    On Vit D, krill oil Tramadol prn  Potential benefits of a long term opioids use as well as potential risks (i.e. addiction risk, apnea etc) and complications (i.e. Somnolence, constipation and others) were explained to the patient and were aknowledged.      HTN (hypertension)    D/c Benazepril HCT due to cough On HCTZ Norvasc 10 mg/d      Cough - Primary     Worsening chronic bronchitis CXR Start Omnicef x10  d D/c Benazepril HCT due to cough On HCTZ Norvasc 10 mg/d      Relevant Orders   DG Chest 2 View      Meds ordered this encounter  Medications   cefdinir (OMNICEF) 300 MG capsule    Sig: Take 1 capsule (300 mg total) by mouth 2 (two) times daily.    Dispense:  20 capsule    Refill:  0      Follow-up: Return for a follow-up visit.  Sonda PrimesAlex Paola Flynt, MD

## 2022-10-17 NOTE — Assessment & Plan Note (Addendum)
Worsening chronic bronchitis CXR Start Omnicef x10 d D/c Benazepril HCT due to cough On HCTZ Norvasc 10 mg/d

## 2022-10-17 NOTE — Assessment & Plan Note (Addendum)
D/c Benazepril HCT due to cough On HCTZ Norvasc 10 mg/d

## 2022-10-17 NOTE — Assessment & Plan Note (Signed)
On Vit D, krill oil Tramadol prn  Potential benefits of a long term opioids use as well as potential risks (i.e. addiction risk, apnea etc) and complications (i.e. Somnolence, constipation and others) were explained to the patient and were aknowledged.

## 2022-12-18 DIAGNOSIS — H524 Presbyopia: Secondary | ICD-10-CM | POA: Diagnosis not present

## 2022-12-18 DIAGNOSIS — Z961 Presence of intraocular lens: Secondary | ICD-10-CM | POA: Diagnosis not present

## 2022-12-18 DIAGNOSIS — H26493 Other secondary cataract, bilateral: Secondary | ICD-10-CM | POA: Diagnosis not present

## 2022-12-18 DIAGNOSIS — H35033 Hypertensive retinopathy, bilateral: Secondary | ICD-10-CM | POA: Diagnosis not present

## 2022-12-18 DIAGNOSIS — E119 Type 2 diabetes mellitus without complications: Secondary | ICD-10-CM | POA: Diagnosis not present

## 2022-12-18 LAB — HM DIABETES EYE EXAM

## 2022-12-26 ENCOUNTER — Telehealth: Payer: Self-pay

## 2022-12-26 NOTE — Telephone Encounter (Signed)
Contacted Kevin Weaver to schedule their annual wellness visit. Appointment made for 01/20/23.  Norton Blizzard, [place] ([facility])  [place] [facility] [phone]

## 2023-01-20 ENCOUNTER — Ambulatory Visit: Payer: Medicare Other | Admitting: Internal Medicine

## 2023-01-20 ENCOUNTER — Ambulatory Visit (INDEPENDENT_AMBULATORY_CARE_PROVIDER_SITE_OTHER): Payer: Medicare Other

## 2023-01-20 VITALS — BP 120/60 | HR 73 | Temp 98.0°F | Ht 70.0 in | Wt 220.8 lb

## 2023-01-20 DIAGNOSIS — Z0000 Encounter for general adult medical examination without abnormal findings: Secondary | ICD-10-CM

## 2023-01-20 NOTE — Patient Instructions (Addendum)
Kevin Weaver , Thank you for taking time to come for your Medicare Wellness Visit. I appreciate your ongoing commitment to your health goals. Please review the following plan we discussed and let me know if I can assist you in the future.   These are the goals we discussed:  Goals      Client will verbalize knowledge of diabetes self-management as evidenced by Hgb A1C <7 or as defined by provider.            This is a list of the screening recommended for you and due dates:  Health Maintenance  Topic Date Due   Zoster (Shingles) Vaccine (1 of 2) Never done   Pneumonia Vaccine (2 of 2 - PCV) 08/10/2006   Yearly kidney health urinalysis for diabetes  12/18/2021   Complete foot exam   01/30/2022   Medicare Annual Wellness Visit  10/12/2022   Hemoglobin A1C  04/18/2023   Yearly kidney function blood test for diabetes  10/18/2023   Eye exam for diabetics  12/19/2023   DTaP/Tdap/Td vaccine (3 - Td or Tdap) 01/03/2025   HPV Vaccine  Aged Out   Flu Shot  Discontinued   COVID-19 Vaccine  Discontinued    Advanced directives: Patient has an DNR  Conditions/risks identified: Yes  Next appointment: Follow up in one year for your annual wellness visit.   Preventive Care 19 Years and Older, Male  Preventive care refers to lifestyle choices and visits with your health care provider that can promote health and wellness. What does preventive care include? A yearly physical exam. This is also called an annual well check. Dental exams once or twice a year. Routine eye exams. Ask your health care provider how often you should have your eyes checked. Personal lifestyle choices, including: Daily care of your teeth and gums. Regular physical activity. Eating a healthy diet. Avoiding tobacco and drug use. Limiting alcohol use. Practicing safe sex. Taking low doses of aspirin every day. Taking vitamin and mineral supplements as recommended by your health care provider. What happens during an  annual well check? The services and screenings done by your health care provider during your annual well check will depend on your age, overall health, lifestyle risk factors, and family history of disease. Counseling  Your health care provider may ask you questions about your: Alcohol use. Tobacco use. Drug use. Emotional well-being. Home and relationship well-being. Sexual activity. Eating habits. History of falls. Memory and ability to understand (cognition). Work and work Statistician. Screening  You may have the following tests or measurements: Height, weight, and BMI. Blood pressure. Lipid and cholesterol levels. These may be checked every 5 years, or more frequently if you are over 7 years old. Skin check. Lung cancer screening. You may have this screening every year starting at age 15 if you have a 30-pack-year history of smoking and currently smoke or have quit within the past 15 years. Fecal occult blood test (FOBT) of the stool. You may have this test every year starting at age 19. Flexible sigmoidoscopy or colonoscopy. You may have a sigmoidoscopy every 5 years or a colonoscopy every 10 years starting at age 81. Prostate cancer screening. Recommendations will vary depending on your family history and other risks. Hepatitis C blood test. Hepatitis B blood test. Sexually transmitted disease (STD) testing. Diabetes screening. This is done by checking your blood sugar (glucose) after you have not eaten for a while (fasting). You may have this done every 1-3 years. Abdominal aortic aneurysm (  AAA) screening. You may need this if you are a current or former smoker. Osteoporosis. You may be screened starting at age 56 if you are at high risk. Talk with your health care provider about your test results, treatment options, and if necessary, the need for more tests. Vaccines  Your health care provider may recommend certain vaccines, such as: Influenza vaccine. This is recommended  every year. Tetanus, diphtheria, and acellular pertussis (Tdap, Td) vaccine. You may need a Td booster every 10 years. Zoster vaccine. You may need this after age 89. Pneumococcal 13-valent conjugate (PCV13) vaccine. One dose is recommended after age 44. Pneumococcal polysaccharide (PPSV23) vaccine. One dose is recommended after age 37. Talk to your health care provider about which screenings and vaccines you need and how often you need them. This information is not intended to replace advice given to you by your health care provider. Make sure you discuss any questions you have with your health care provider. Document Released: 11/10/2015 Document Revised: 07/03/2016 Document Reviewed: 08/15/2015 Elsevier Interactive Patient Education  2017 [place] Prevention in the Home Falls can cause injuries. They can happen to people of all ages. There are many things you can do to make your home safe and to help prevent falls. What can I do on the outside of my home? Regularly fix the edges of walkways and driveways and fix any cracks. Remove anything that might make you trip as you walk through a door, such as a raised step or threshold. Trim any bushes or trees on the path to your home. Use bright outdoor lighting. Clear any walking paths of anything that might make someone trip, such as rocks or tools. Regularly check to see if handrails are loose or broken. Make sure that both sides of any steps have handrails. Any raised decks and porches should have guardrails on the edges. Have any leaves, snow, or ice cleared regularly. Use sand or salt on walking paths during winter. Clean up any spills in your garage right away. This includes oil or grease spills. What can I do in the bathroom? Use night lights. Install grab bars by the toilet and in the tub and shower. Do not use towel bars as grab bars. Use non-skid mats or decals in the tub or shower. If you need to sit down in the shower,  use a plastic, non-slip stool. Keep the floor dry. Clean up any water that spills on the floor as soon as it happens. Remove soap buildup in the tub or shower regularly. Attach bath mats securely with double-sided non-slip rug tape. Do not have throw rugs and other things on the floor that can make you trip. What can I do in the bedroom? Use night lights. Make sure that you have a light by your bed that is easy to reach. Do not use any sheets or blankets that are too big for your bed. They should not hang down onto the floor. Have a firm chair that has side arms. You can use this for support while you get dressed. Do not have throw rugs and other things on the floor that can make you trip. What can I do in the kitchen? Clean up any spills right away. Avoid walking on wet floors. Keep items that you use a lot in easy-to-reach places. If you need to reach something above you, use a strong step stool that has a grab bar. Keep electrical cords out of the way. Do not use floor polish  or wax that makes floors slippery. If you must use wax, use non-skid floor wax. Do not have throw rugs and other things on the floor that can make you trip. What can I do with my stairs? Do not leave any items on the stairs. Make sure that there are handrails on both sides of the stairs and use them. Fix handrails that are broken or loose. Make sure that handrails are as long as the stairways. Check any carpeting to make sure that it is firmly attached to the stairs. Fix any carpet that is loose or worn. Avoid having throw rugs at the top or bottom of the stairs. If you do have throw rugs, attach them to the floor with carpet tape. Make sure that you have a light switch at the top of the stairs and the bottom of the stairs. If you do not have them, ask someone to add them for you. What else can I do to help prevent falls? Wear shoes that: Do not have high heels. Have rubber bottoms. Are comfortable and fit you  well. Are closed at the toe. Do not wear sandals. If you use a stepladder: Make sure that it is fully opened. Do not climb a closed stepladder. Make sure that both sides of the stepladder are locked into place. Ask someone to hold it for you, if possible. Clearly mark and make sure that you can see: Any grab bars or handrails. First and last steps. Where the edge of each step is. Use tools that help you move around (mobility aids) if they are needed. These include: Canes. Walkers. Scooters. Crutches. Turn on the lights when you go into a dark area. Replace any light bulbs as soon as they burn out. Set up your furniture so you have a clear path. Avoid moving your furniture around. If any of your floors are uneven, fix them. If there are any pets around you, be aware of where they are. Review your medicines with your doctor. Some medicines can make you feel dizzy. This can increase your chance of falling. Ask your doctor what other things that you can do to help prevent falls. This information is not intended to replace advice given to you by your health care provider. Make sure you discuss any questions you have with your health care provider. Document Released: 08/10/2009 Document Revised: 03/21/2016 Document Reviewed: 11/18/2014 Elsevier Interactive Patient Education  2017 [employer].
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
## 2023-02-05 ENCOUNTER — Ambulatory Visit (INDEPENDENT_AMBULATORY_CARE_PROVIDER_SITE_OTHER): Payer: Medicare Other | Admitting: Internal Medicine

## 2023-02-05 ENCOUNTER — Encounter: Payer: Self-pay | Admitting: Internal Medicine

## 2023-02-05 VITALS — BP 130/80 | HR 77 | Temp 97.8°F | Ht 70.0 in | Wt 220.0 lb

## 2023-02-05 DIAGNOSIS — J40 Bronchitis, not specified as acute or chronic: Secondary | ICD-10-CM | POA: Diagnosis not present

## 2023-02-05 DIAGNOSIS — I10 Essential (primary) hypertension: Secondary | ICD-10-CM | POA: Diagnosis not present

## 2023-02-05 DIAGNOSIS — E1159 Type 2 diabetes mellitus with other circulatory complications: Secondary | ICD-10-CM | POA: Diagnosis not present

## 2023-02-05 DIAGNOSIS — M19079 Primary osteoarthritis, unspecified ankle and foot: Secondary | ICD-10-CM

## 2023-02-05 DIAGNOSIS — E782 Mixed hyperlipidemia: Secondary | ICD-10-CM | POA: Diagnosis not present

## 2023-02-05 LAB — COMPREHENSIVE METABOLIC PANEL
ALT: 18 U/L (ref 0–53)
AST: 23 U/L (ref 0–37)
Albumin: 4.1 g/dL (ref 3.5–5.2)
Alkaline Phosphatase: 68 U/L (ref 39–117)
BUN: 19 mg/dL (ref 6–23)
CO2: 29 mEq/L (ref 19–32)
Calcium: 9.6 mg/dL (ref 8.4–10.5)
Chloride: 102 mEq/L (ref 96–112)
Creatinine, Ser: 0.96 mg/dL (ref 0.40–1.50)
GFR: 74.14 mL/min (ref >60.00)
Glucose, Bld: 74 mg/dL (ref 70–99)
Potassium: 4.2 mEq/L (ref 3.5–5.1)
Sodium: 140 mEq/L (ref 135–145)
Total Bilirubin: 0.5 mg/dL (ref 0.2–1.2)
Total Protein: 7.2 g/dL (ref 6.0–8.3)

## 2023-02-05 LAB — HEMOGLOBIN A1C: Hgb A1c MFr Bld: 9.9 % — ABNORMAL HIGH (ref 4.6–6.5)

## 2023-02-05 MED ORDER — CEFDINIR 300 MG PO CAPS: 300.0000 mg | ORAL_CAPSULE | Freq: Two times a day (BID) | ORAL | 0 refills | Status: DC

## 2023-02-05 NOTE — Progress Notes (Signed)
Subjective:  Patient ID: Kevin Weaver, male    DOB: 03-28-1941  Age: 82 y.o. MRN: [id number]  CC: Follow-up (3 [facility] F/U)   HPI Kevin Weaver presents for HTN, DM, dyslipidemia C/o bronchitis sx's - abx helped  Outpatient Medications Prior to Visit  Medication Sig Dispense Refill   amLODipine (NORVASC) 10 MG tablet Take 1 tablet (10 mg total) by mouth daily. 90 tablet 3   atorvastatin (LIPITOR) 40 MG tablet Take 1 tablet (40 mg total) by mouth daily. 90 tablet 3   Cholecalciferol (VITAMIN D3) 50 MCG (2000 UT) capsule Take 1 capsule (2,000 Units total) by mouth daily. 100 capsule 3   Cyanocobalamin (B-12) 5000 MCG CAPS Take by mouth daily with breakfast.     glipiZIDE (GLUCOTROL XL) 10 MG 24 hr tablet Take 1 tablet (10 mg total) by mouth 2 (two) times daily. 180 tablet 3   hydrochlorothiazide (HYDRODIURIL) 25 MG tablet Take 1 tablet (25 mg total) by mouth daily. 90 tablet 3   metFORMIN (GLUCOPHAGE) 1000 MG tablet TAKE 1 TABLET BY MOUTH TWICE A DAY WITH A MEAL 180 tablet 3   omeprazole (PRILOSEC) 20 MG capsule TAKE 1 CAPSULE BY MOUTH EVERY DAY 90 capsule 3   pioglitazone (ACTOS) 45 MG tablet Take 1 tablet (45 mg total) by mouth daily. 90 tablet 3   potassium chloride SA (KLOR-CON M20) 20 MEQ tablet Take 1 tablet (20 mEq total) by mouth daily. 90 tablet 3   traMADol (ULTRAM) 50 MG tablet TAKE 1 TABLET BY MOUTH EVERY 8 HOURS AS NEEDED FOR SEVERE PAIN. 90 tablet 2   Zinc 50 MG CAPS Take by mouth at bedtime.     No facility-administered medications prior to visit.    ROS: Review of Systems  Constitutional:  Negative for appetite change, fatigue and unexpected weight change.  HENT:  Positive for congestion and rhinorrhea. Negative for nosebleeds, sneezing, sore throat and trouble swallowing.   Eyes:  Positive for itching. Negative for visual disturbance.  Respiratory:  Positive for cough. Negative for shortness of breath.   Cardiovascular:  Negative for chest pain, palpitations and  leg swelling.  Gastrointestinal:  Negative for abdominal distention, blood in stool, diarrhea and nausea.  Genitourinary:  Negative for frequency and hematuria.  Musculoskeletal:  Positive for arthralgias and gait problem. Negative for back pain, joint swelling and neck pain.  Skin:  Negative for rash.  Neurological:  Negative for dizziness, tremors, speech difficulty and weakness.  Psychiatric/Behavioral:  Negative for agitation, dysphoric mood and sleep disturbance. The patient is not nervous/anxious.     Objective:  BP 130/80 (BP Location: Left Arm, Patient Position: Sitting, Cuff Size: Large)   Pulse 77   Temp 97.8 F (36.6 C) (Oral)   Ht 5\' 10"  (1.778 m)   Wt 220 lb (99.8 kg)   SpO2 97%   BMI 31.57 kg/m   BP Readings from Last 3 Encounters:  02/05/23 130/80  01/20/23 120/60  10/17/22 (!) 162/80    Wt Readings from Last 3 Encounters:  02/05/23 220 lb (99.8 kg)  01/20/23 220 lb 12.8 oz (100.2 kg)  10/17/22 220 lb 12.8 oz (100.2 kg)    Physical Exam Constitutional:      General: He is not in acute distress.    Appearance: He is well-developed. He is obese.     Comments: NAD  Eyes:     Conjunctiva/sclera: Conjunctivae normal.     Pupils: Pupils are equal, round, and reactive to light.  Neck:  Thyroid: No thyromegaly.     Vascular: No JVD.  Cardiovascular:     Rate and Rhythm: Normal rate and regular rhythm.     Heart sounds: Normal heart sounds. No murmur heard.    No friction rub. No gallop.  Pulmonary:     Effort: Pulmonary effort is normal. No respiratory distress.     Breath sounds: Normal breath sounds. No wheezing or rales.  Chest:     Chest wall: No tenderness.  Abdominal:     General: Bowel sounds are normal. There is no distension.     Palpations: Abdomen is soft. There is no mass.     Tenderness: There is no abdominal tenderness. There is no guarding or rebound.  Musculoskeletal:        General: Tenderness present. Normal range of motion.      Cervical back: Normal range of motion.     Right lower leg: No edema.     Left lower leg: No edema.  Lymphadenopathy:     Cervical: No cervical adenopathy.  Skin:    General: Skin is warm and dry.     Findings: No rash.  Neurological:     Mental Status: He is alert and oriented to person, place, and time.     Cranial Nerves: No cranial nerve deficit.     Motor: No abnormal muscle tone.     Coordination: Coordination normal.     Gait: Gait normal.     Deep Tendon Reflexes: Reflexes are normal and symmetric.  Psychiatric:        Behavior: Behavior normal.        Thought Content: Thought content normal.        Judgment: Judgment normal.   Using a cane R foot deformity   Lab Results  Component Value Date   WBC 7.1 10/17/2022   HGB 12.2 (L) 10/17/2022   HCT 37.7 (L) 10/17/2022   PLT 259.0 10/17/2022   GLUCOSE 113 (H) 10/17/2022   CHOL 130 10/17/2022   TRIG 59.0 10/17/2022   HDL 63.00 10/17/2022   LDLCALC 55 10/17/2022   ALT 17 10/17/2022   AST 19 10/17/2022   NA 137 10/17/2022   K 3.7 10/17/2022   CL 99 10/17/2022   CREATININE 1.14 10/17/2022   BUN 31 (H) 10/17/2022   CO2 27 10/17/2022   TSH 1.92 10/17/2022   PSA 0.32 10/17/2022   HGBA1C 8.0 (H) 10/17/2022   MICROALBUR <0.7 12/18/2020    VAS US CAROTID  Result Date: 03/30/2022 Carotid Arterial Duplex Study Patient Name:  Kevin Weaver  Date of Exam:   03/28/2022 Medical Rec #: [id number]       Accession #:    [id number] Date of Birth: January 24, 1941      Patient Gender: M Patient Age:   [age] years Exam Location:  [facility] Procedure:      VAS US CAROTID Referring Phys: Kevin Weaver --------------------------------------------------------------------------------  Indications:       Carotid artery stenosis. Patient denies cerebrovascular                    symptoms but does complain that his fingers on the left hand                    will sometimes feel cold and numb. Risk Factors:      Hypertension, Diabetes, no history of  smoking. Comparison Study:  Previous carotid duplex performed 12/27/20 showed RICA  velocities of 47/21 cm/sec and LICA velocities of 150/45                    cm/sec. Performing Technologist: Kevin Weaver RVT  Examination Guidelines: A complete evaluation includes B-mode imaging, spectral Doppler, color Doppler, and power Doppler as needed of all accessible portions of each vessel. Bilateral testing is considered an integral part of a complete examination. Limited examinations for reoccurring indications may be performed as noted.  Right Carotid Findings: +----------+--------+--------+--------+------------------+--------+           PSV cm/sEDV cm/sStenosisPlaque DescriptionComments +----------+--------+--------+--------+------------------+--------+ CCA Prox  52      11                                         +----------+--------+--------+--------+------------------+--------+ CCA Distal62      16                                         +----------+--------+--------+--------+------------------+--------+ ICA Prox  63      21              heterogenous               +----------+--------+--------+--------+------------------+--------+ ICA Mid   67      21      1-39%                              +----------+--------+--------+--------+------------------+--------+ ICA Distal71      32                                         +----------+--------+--------+--------+------------------+--------+ ECA       54      14                                         +----------+--------+--------+--------+------------------+--------+ +----------+--------+-------+----------------+-------------------+           PSV cm/sEDV cmsDescribe        Arm Pressure (mmHG) +----------+--------+-------+----------------+-------------------+ [id number]             Multiphasic, [id number]                 +----------+--------+-------+----------------+-------------------+  +---------+--------+-+--------+-+------+ VertebralPSV cm/s0EDV cm/s0Absent +---------+--------+-+--------+-+------+  Left Carotid Findings: +----------+--------+--------+--------+-------------------------+--------+           PSV cm/sEDV cm/sStenosisPlaque Description       Comments +----------+--------+--------+--------+-------------------------+--------+ CCA Prox  66      17                                                +----------+--------+--------+--------+-------------------------+--------+ CCA Distal55      16                                                +----------+--------+--------+--------+-------------------------+--------+ ICA Prox  133  49              heterogenous                      +----------+--------+--------+--------+-------------------------+--------+ ICA Mid   166     55      40-59%  irregular and hyperechoic         +----------+--------+--------+--------+-------------------------+--------+ ICA Distal43      15                                                +----------+--------+--------+--------+-------------------------+--------+ ECA       47      12                                                +----------+--------+--------+--------+-------------------------+--------+ +----------+--------+--------+----------------+-------------------+           PSV cm/sEDV cm/sDescribe        Arm Pressure (mmHG) +----------+--------+--------+----------------+-------------------+ Subclavian80      0       Multiphasic, [id number]                 +----------+--------+--------+----------------+-------------------+ +---------+--------+--+--------+--+---------+ VertebralPSV cm/s42EDV cm/s17Antegrade +---------+--------+--+--------+--+---------+   Summary: Right Carotid: Velocities in the right ICA are consistent with a 1-39% stenosis. 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 Vertebrals:  Left vertebral  artery demonstrates antegrade flow. Right vertebral              artery demonstrates an occlusion. Subclavians: Normal flow hemodynamics were seen in bilateral subclavian              arteries. *See table(s) above for measurements and observations. Suggest follow up study in 12 months. Electronically signed by Dina Rich MD on 03/30/2022 at 3:45:36 PM.    Final    MYOCARDIAL PERFUSION IMAGING  Result Date: 03/29/2022   The study is normal. Findings are consistent with no prior ischemia and no prior myocardial infarction. The study is low risk.   No ST deviation was noted.   LV perfusion is normal. There is no evidence of ischemia. There is no evidence of infarction.   Left ventricular function is normal. Nuclear stress EF: 64 %. The left ventricular ejection fraction is normal (55-65%). End diastolic cavity size is normal. End systolic cavity size is normal.   Prior study available for comparison from 03/16/2009.   ECHOCARDIOGRAM COMPLETE  Result Date: 03/29/2022    ECHOCARDIOGRAM REPORT   Patient Name:   DEAKEN JURGENS Hanlon Date of Exam: 03/29/2022 Medical Rec #:  [id number]      Height:       70.0 in Accession #:    [id number]     Weight:       218.0 lb Date of Birth:  Jun 21, 1941     BSA:          2.165 m Patient Age:    80 years       BP:           107/69 mmHg Patient Gender: M              HR:           57 bpm. Exam Location:  [facility] Procedure:  2D Echo, Cardiac Doppler, Color Doppler and Intracardiac            Opacification Agent Indications:    R01.1 Murmur  History:        Patient has no prior history of Echocardiogram examinations.                 Signs/Symptoms:Murmur; Risk Factors:Hypertension, Dyslipidemia                 and Diabetes.  Sonographer:    Samule Ohm RDCS Referring Phys: [phone] TRACI R Weaver  Sonographer Comments: Technically difficult study due to poor echo windows. IMPRESSIONS  1. Left ventricular ejection fraction, by estimation, is 60 to 65%. The left ventricle has normal  function. The left ventricle has no regional wall motion abnormalities. 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 Left ventricular diastolic parameters are consistent with Grade I diastolic dysfunction (impaired relaxation).  2. Right ventricular systolic function is normal. The right ventricular size is mildly enlarged. There is normal pulmonary artery systolic pressure. The estimated right ventricular systolic pressure is 23.8 mmHg.  3. Left atrial size was mildly dilated.  4. The mitral valve is normal in structure. Mild mitral valve regurgitation. No evidence of mitral stenosis.  5. Tricuspid valve regurgitation is moderate.  6. The aortic valve is normal in structure. There is mild calcification of the aortic valve. There is mild thickening of the aortic valve. Aortic valve regurgitation is not visualized. Moderate aortic valve stenosis. Aortic valve area, by VTI measures 1.34 cm. Aortic valve mean gradient measures 18.0 mmHg. Aortic valve Vmax measures 2.92 m/s.  7. Aortic dilatation noted. There is moderate dilatation of the ascending aorta, measuring 45 mm.  8. The inferior vena cava is normal in size with greater than 50% respiratory variability, suggesting right atrial pressure of 3 mmHg. FINDINGS  Left Ventricle: Left ventricular ejection fraction, by estimation, is 60 to 65%. The left ventricle has normal function. The left ventricle has no regional wall motion abnormalities. Definity contrast agent was given IV to delineate the left ventricular  endocardial borders. The left ventricular internal cavity size was normal in size. 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 Left ventricular diastolic parameters are consistent with Grade I diastolic dysfunction (impaired relaxation). Right Ventricle: The right ventricular size is mildly enlarged. No increase in right ventricular wall thickness. Right ventricular systolic  function is normal. There is normal pulmonary artery systolic pressure. The tricuspid regurgitant velocity is 2.28  m/s, and with an assumed right atrial pressure of 3 mmHg, the estimated right ventricular systolic pressure is 23.8 mmHg. Left Atrium: Left atrial size was mildly dilated. Right Atrium: Right atrial size was normal in size. Pericardium: There is no evidence of pericardial effusion. Mitral Valve: The mitral valve is normal in structure. Mild mitral valve regurgitation. No evidence of mitral valve stenosis. Tricuspid Valve: The tricuspid valve is normal in structure. Tricuspid valve regurgitation is moderate . No evidence of tricuspid stenosis. Aortic Valve: The aortic valve is normal in structure. There is mild calcification of the aortic valve. There is mild thickening of the aortic valve. Aortic valve regurgitation is not visualized. Moderate aortic stenosis is present. Aortic valve mean gradient measures 18.0 mmHg. Aortic valve peak gradient measures 34.2 mmHg. Aortic valve area, by VTI measures 1.34 cm. Pulmonic Valve: The pulmonic valve was normal in structure. Pulmonic valve regurgitation is not visualized. No evidence of pulmonic stenosis. Aorta: The aortic root is  normal in size and structure and aortic dilatation noted. There is moderate dilatation of the ascending aorta, measuring 45 mm. Venous: The inferior vena cava is normal in size with greater than 50% respiratory variability, suggesting right atrial pressure of 3 mmHg. IAS/Shunts: No atrial level shunt detected by color flow Doppler.  LEFT VENTRICLE PLAX 2D LVIDd:         4.90 cm   Diastology LVIDs:         2.90 cm   LV e' medial:    5.44 cm/s LV PW:         1.10 cm   LV E/e' medial:  17.3 LV IVS:        1.60 cm   LV e' lateral:   9.79 cm/s LVOT diam:     2.30 cm   LV E/e' lateral: 9.6 LV SV:         93 LV SV Index:   43 LVOT Area:     4.15 cm  RIGHT VENTRICLE RVSP:           23.8 mmHg LEFT ATRIUM             Index        RIGHT ATRIUM            Index LA diam:        5.50 cm 2.54 cm/m   RA Pressure: 3.00 mmHg LA Vol (A2C):   59.7 ml 27.57 ml/m  RA Area:     14.60 cm LA Vol (A4C):   83.7 ml 38.66 ml/m  RA Volume:   28.80 ml  13.30 ml/m LA Biplane Vol: 73.0 ml 33.72 ml/m  AORTIC VALVE AV Area (Vmax):    1.43 cm AV Area (Vmean):   1.45 cm AV Area (VTI):     1.34 cm AV Vmax:           292.50 cm/s AV Vmean:          196.500 cm/s AV VTI:            0.692 m AV Peak Grad:      34.2 mmHg AV Mean Grad:      18.0 mmHg LVOT Vmax:         101.00 cm/s LVOT Vmean:        68.400 cm/s LVOT VTI:          0.224 m LVOT/AV VTI ratio: 0.32  AORTA Ao Root diam: 4.10 cm Ao Asc diam:  4.50 cm MITRAL VALVE                TRICUSPID VALVE MV Area (PHT): 3.40 cm     TR Peak grad:   20.8 mmHg MV Decel Time: 223 msec     TR Vmax:        228.00 cm/s MV E velocity: 94.30 cm/s   Estimated RAP:  3.00 mmHg MV A velocity: 103.00 cm/s  RVSP:           23.8 mmHg MV E/A ratio:  0.92                             SHUNTS                             Systemic VTI:  0.22 m  Systemic Diam: 2.30 cm Donato Schultz MD Electronically signed by Donato Schultz MD Signature Date/Time: 03/29/2022/1:34:26 PM    Final     Assessment & Plan:   Problem List Items Addressed This Visit       Cardiovascular and Mediastinum   HTN (hypertension) - Primary     On HCTZ Norvasc 10 mg/d      Relevant Orders   Comprehensive metabolic panel   Comprehensive metabolic panel     Respiratory   Bronchitis    Omnicef Rx x 10 days        Endocrine   DM (diabetes mellitus)     Cont w/ Metformin, glipizide and Actos. On Lipitor now Loose a few lbs       Relevant Orders   Comprehensive metabolic panel   Hemoglobin A1c   Comprehensive metabolic panel   Hemoglobin A1c     Musculoskeletal and Integument   Arthritis of foot    Chronic pain Cont on Tramadol prn  Potential benefits of a long term opioids use as well as potential risks (i.e. addiction risk, apnea  etc) and complications (i.e. Somnolence, constipation and others) were explained to the patient and were aknowledged.        Other   Hyperlipidemia     On Lipitor and krill oil         Meds ordered this encounter  Medications   cefdinir (OMNICEF) 300 MG capsule    Sig: Take 1 capsule (300 mg total) by mouth 2 (two) times daily.    Dispense:  20 capsule    Refill:  0      Follow-up: Return in about 3 months (around 05/07/2023) for a follow-up visit.  Sonda Primes, MD

## 2023-02-05 NOTE — Assessment & Plan Note (Signed)
  Cont w/ Metformin, glipizide and Actos. On Lipitor now Loose a few lbs

## 2023-02-05 NOTE — Assessment & Plan Note (Signed)
  On HCTZ Norvasc 10 mg/d

## 2023-02-05 NOTE — Assessment & Plan Note (Signed)
Omnicef Rx x 10 days

## 2023-02-05 NOTE — Assessment & Plan Note (Signed)
On Lipitor and krill oil 

## 2023-02-05 NOTE — Assessment & Plan Note (Signed)
Chronic pain ?Cont on Tramadol prn ? Potential benefits of a long term opioids use as well as potential risks (i.e. addiction risk, apnea etc) and complications (i.e. Somnolence, constipation and others) were explained to the patient and were aknowledged. ?

## 2023-02-09 NOTE — Addendum Note (Signed)
Addended by: Tresa Garter on: 02/09/2023 09:08 AM   Modules accepted: Orders

## 2023-05-07 ENCOUNTER — Encounter: Payer: Self-pay | Admitting: Internal Medicine

## 2023-05-07 ENCOUNTER — Ambulatory Visit (INDEPENDENT_AMBULATORY_CARE_PROVIDER_SITE_OTHER): Payer: Medicare Other | Admitting: Internal Medicine

## 2023-05-07 VITALS — BP 118/68 | HR 85 | Temp 98.3°F | Ht 70.0 in | Wt 216.0 lb

## 2023-05-07 DIAGNOSIS — Z7984 Long term (current) use of oral hypoglycemic drugs: Secondary | ICD-10-CM | POA: Diagnosis not present

## 2023-05-07 DIAGNOSIS — I10 Essential (primary) hypertension: Secondary | ICD-10-CM

## 2023-05-07 DIAGNOSIS — E782 Mixed hyperlipidemia: Secondary | ICD-10-CM | POA: Diagnosis not present

## 2023-05-07 DIAGNOSIS — E1159 Type 2 diabetes mellitus with other circulatory complications: Secondary | ICD-10-CM | POA: Diagnosis not present

## 2023-05-07 DIAGNOSIS — M1991 Primary osteoarthritis, unspecified site: Secondary | ICD-10-CM | POA: Diagnosis not present

## 2023-05-07 DIAGNOSIS — I7121 Aneurysm of the ascending aorta, without rupture: Secondary | ICD-10-CM

## 2023-05-07 LAB — COMPREHENSIVE METABOLIC PANEL
ALT: 13 U/L (ref 0–53)
AST: 21 U/L (ref 0–37)
Albumin: 4.1 g/dL (ref 3.5–5.2)
Alkaline Phosphatase: 80 U/L (ref 39–117)
BUN: 18 mg/dL (ref 6–23)
CO2: 26 mEq/L (ref 19–32)
Calcium: 10.1 mg/dL (ref 8.4–10.5)
Chloride: 100 mEq/L (ref 96–112)
Creatinine, Ser: 1.09 mg/dL (ref 0.40–1.50)
GFR: 63.55 mL/min (ref >60.00)
Glucose, Bld: 82 mg/dL (ref 70–99)
Potassium: 4.5 mEq/L (ref 3.5–5.1)
Sodium: 137 mEq/L (ref 135–145)
Total Bilirubin: 0.7 mg/dL (ref 0.2–1.2)
Total Protein: 7.2 g/dL (ref 6.0–8.3)

## 2023-05-07 LAB — HEMOGLOBIN A1C: Hgb A1c MFr Bld: 8 % — ABNORMAL HIGH (ref 4.6–6.5)

## 2023-05-07 MED ORDER — TRAMADOL HCL 50 MG PO TABS: ORAL_TABLET | ORAL | 2 refills | Status: DC

## 2023-05-07 NOTE — Progress Notes (Signed)
Subjective:  Patient ID: Kevin Weaver, male    DOB: 1941-07-17  Age: 82 y.o. MRN: [id number]  CC: No chief complaint on file.   HPI BRAELON SPRUNG presents for LBP/OA, DM, HTN R forearm w/abrasions - pt fell  Outpatient Medications Prior to Visit  Medication Sig Dispense Refill   amLODipine (NORVASC) 10 MG tablet Take 1 tablet (10 mg total) by mouth daily. 90 tablet 3   atorvastatin (LIPITOR) 40 MG tablet Take 1 tablet (40 mg total) by mouth daily. 90 tablet 3   cefdinir (OMNICEF) 300 MG capsule Take 1 capsule (300 mg total) by mouth 2 (two) times daily. 20 capsule 0   Cyanocobalamin (B-12) 5000 MCG CAPS Take by mouth daily with breakfast.     glipiZIDE (GLUCOTROL XL) 10 MG 24 hr tablet Take 1 tablet (10 mg total) by mouth 2 (two) times daily. 180 tablet 3   hydrochlorothiazide (HYDRODIURIL) 25 MG tablet Take 1 tablet (25 mg total) by mouth daily. 90 tablet 3   metFORMIN (GLUCOPHAGE) 1000 MG tablet TAKE 1 TABLET BY MOUTH TWICE A DAY WITH A MEAL 180 tablet 3   omeprazole (PRILOSEC) 20 MG capsule TAKE 1 CAPSULE BY MOUTH EVERY DAY 90 capsule 3   pioglitazone (ACTOS) 45 MG tablet Take 1 tablet (45 mg total) by mouth daily. 90 tablet 3   potassium chloride SA (KLOR-CON M20) 20 MEQ tablet Take 1 tablet (20 mEq total) by mouth daily. 90 tablet 3   traMADol (ULTRAM) 50 MG tablet TAKE 1 TABLET BY MOUTH EVERY 8 HOURS AS NEEDED FOR SEVERE PAIN. 90 tablet 2   Cholecalciferol (VITAMIN D3) 50 MCG (2000 UT) capsule Take 1 capsule (2,000 Units total) by mouth daily. 100 capsule 3   Zinc 50 MG CAPS Take by mouth at bedtime.     No facility-administered medications prior to visit.    ROS: Review of Systems  Constitutional:  Negative for appetite change, fatigue and unexpected weight change.  HENT:  Negative for congestion, nosebleeds, sneezing, sore throat and trouble swallowing.   Eyes:  Negative for itching and visual disturbance.  Respiratory:  Negative for cough.   Cardiovascular:  Negative  for chest pain, palpitations and leg swelling.  Gastrointestinal:  Negative for abdominal distention, blood in stool, diarrhea and nausea.  Genitourinary:  Negative for frequency and hematuria.  Musculoskeletal:  Positive for arthralgias, back pain and gait problem. Negative for joint swelling and neck pain.  Skin:  Negative for rash.  Neurological:  Negative for dizziness, tremors, speech difficulty and weakness.  Psychiatric/Behavioral:  Negative for agitation, dysphoric mood and sleep disturbance. The patient is not nervous/anxious.     Objective:  BP 118/68 (BP Location: Left Arm, Patient Position: Sitting, Cuff Size: Large)   Pulse 85   Temp 98.3 F (36.8 C) (Oral)   Ht 5\' 10"  (1.778 m)   Wt 216 lb (98 kg)   SpO2 95%   BMI 30.99 kg/m   BP Readings from Last 3 Encounters:  05/07/23 118/68  02/05/23 130/80  01/20/23 120/60    Wt Readings from Last 3 Encounters:  05/07/23 216 lb (98 kg)  02/05/23 220 lb (99.8 kg)  01/20/23 220 lb 12.8 oz (100.2 kg)    Physical Exam Constitutional:      General: He is not in acute distress.    Appearance: He is well-developed. He is obese.     Comments: NAD  Eyes:     Conjunctiva/sclera: Conjunctivae normal.     Pupils: Pupils  are equal, round, and reactive to light.  Neck:     Thyroid: No thyromegaly.     Vascular: No JVD.  Cardiovascular:     Rate and Rhythm: Normal rate and regular rhythm.     Heart sounds: Murmur heard.     No friction rub. No gallop.  Pulmonary:     Effort: Pulmonary effort is normal. No respiratory distress.     Breath sounds: Normal breath sounds. No wheezing or rales.  Chest:     Chest wall: No tenderness.  Abdominal:     General: Bowel sounds are normal. There is no distension.     Palpations: Abdomen is soft. There is no mass.     Tenderness: There is no abdominal tenderness. There is no guarding or rebound.  Musculoskeletal:        General: No tenderness. Normal range of motion.     Cervical back:  Normal range of motion.     Right lower leg: No edema.     Left lower leg: No edema.  Lymphadenopathy:     Cervical: No cervical adenopathy.  Skin:    General: Skin is warm and dry.     Findings: No rash.  Neurological:     Mental Status: He is alert and oriented to person, place, and time.     Cranial Nerves: No cranial nerve deficit.     Motor: No abnormal muscle tone.     Coordination: Coordination abnormal.     Gait: Gait abnormal.     Deep Tendon Reflexes: Reflexes are normal and symmetric.  Psychiatric:        Behavior: Behavior normal.        Thought Content: Thought content normal.        Judgment: Judgment normal.   Using a cane R forearm w/abrasions - dressed  Lab Results  Component Value Date   WBC 7.1 10/17/2022   HGB 12.2 (L) 10/17/2022   HCT 37.7 (L) 10/17/2022   PLT 259.0 10/17/2022   GLUCOSE 74 02/05/2023   CHOL 130 10/17/2022   TRIG 59.0 10/17/2022   HDL 63.00 10/17/2022   LDLCALC 55 10/17/2022   ALT 18 02/05/2023   AST 23 02/05/2023   NA 140 02/05/2023   K 4.2 02/05/2023   CL 102 02/05/2023   CREATININE 0.96 02/05/2023   BUN 19 02/05/2023   CO2 29 02/05/2023   TSH 1.92 10/17/2022   PSA 0.32 10/17/2022   HGBA1C 9.9 (H) 02/05/2023   MICROALBUR <0.7 12/18/2020    VAS US CAROTID  Result Date: 03/30/2022 Carotid Arterial Duplex Study Patient Name:  KELIN BORUM Furia  Date of Exam:   03/28/2022 Medical Rec #: [id number]       Accession #:    [id number] Date of Birth: 24-Nov-1940      Patient Gender: M Patient Age:   29 years Exam Location:  [facility] Procedure:      VAS US CAROTID Referring Phys: Gloris Manchester TURNER --------------------------------------------------------------------------------  Indications:       Carotid artery stenosis. Patient denies cerebrovascular                    symptoms but does complain that his fingers on the left hand                    will sometimes feel cold and numb. Risk Factors:      Hypertension, Diabetes, no history of smoking.  Comparison Study:  Previous carotid duplex performed 12/27/20 showed  RICA                    velocities of 47/21 cm/sec and LICA velocities of 150/45                    cm/sec. Performing Technologist: Olegario Shearer RVT  Examination Guidelines: A complete evaluation includes B-mode imaging, spectral Doppler, color Doppler, and power Doppler as needed of all accessible portions of each vessel. Bilateral testing is considered an integral part of a complete examination. Limited examinations for reoccurring indications may be performed as noted.  Right Carotid Findings: +----------+--------+--------+--------+------------------+--------+           PSV cm/sEDV cm/sStenosisPlaque DescriptionComments +----------+--------+--------+--------+------------------+--------+ CCA Prox  52      11                                         +----------+--------+--------+--------+------------------+--------+ CCA Distal62      16                                         +----------+--------+--------+--------+------------------+--------+ ICA Prox  63      21              heterogenous               +----------+--------+--------+--------+------------------+--------+ ICA Mid   67      21      1-39%                              +----------+--------+--------+--------+------------------+--------+ ICA Distal71      32                                         +----------+--------+--------+--------+------------------+--------+ ECA       54      14                                         +----------+--------+--------+--------+------------------+--------+ +----------+--------+-------+----------------+-------------------+           PSV cm/sEDV cmsDescribe        Arm Pressure (mmHG) +----------+--------+-------+----------------+-------------------+ [id number]             Multiphasic, [id number]                 +----------+--------+-------+----------------+-------------------+  +---------+--------+-+--------+-+------+ VertebralPSV cm/s0EDV cm/s0Absent +---------+--------+-+--------+-+------+  Left Carotid Findings: +----------+--------+--------+--------+-------------------------+--------+           PSV cm/sEDV cm/sStenosisPlaque Description       Comments +----------+--------+--------+--------+-------------------------+--------+ CCA Prox  66      17                                                +----------+--------+--------+--------+-------------------------+--------+ CCA Distal55      16                                                +----------+--------+--------+--------+-------------------------+--------+  ICA Prox  133     49              heterogenous                      +----------+--------+--------+--------+-------------------------+--------+ ICA Mid   166     55      40-59%  irregular and hyperechoic         +----------+--------+--------+--------+-------------------------+--------+ ICA Distal43      15                                                +----------+--------+--------+--------+-------------------------+--------+ ECA       47      12                                                +----------+--------+--------+--------+-------------------------+--------+ +----------+--------+--------+----------------+-------------------+           PSV cm/sEDV cm/sDescribe        Arm Pressure (mmHG) +----------+--------+--------+----------------+-------------------+ Subclavian80      0       Multiphasic, [id number]                 +----------+--------+--------+----------------+-------------------+ +---------+--------+--+--------+--+---------+ VertebralPSV cm/s42EDV cm/s17Antegrade +---------+--------+--+--------+--+---------+   Summary: Right Carotid: Velocities in the right ICA are consistent with a 1-39% stenosis. 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 Vertebrals:  Left vertebral  artery demonstrates antegrade flow. Right vertebral              artery demonstrates an occlusion. Subclavians: Normal flow hemodynamics were seen in bilateral subclavian              arteries. *See table(s) above for measurements and observations. Suggest follow up study in 12 months. Electronically signed by Dina Rich MD on 03/30/2022 at 3:45:36 PM.    Final    MYOCARDIAL PERFUSION IMAGING  Result Date: 03/29/2022   The study is normal. Findings are consistent with no prior ischemia and no prior myocardial infarction. The study is low risk.   No ST deviation was noted.   LV perfusion is normal. There is no evidence of ischemia. There is no evidence of infarction.   Left ventricular function is normal. Nuclear stress EF: 64 %. The left ventricular ejection fraction is normal (55-65%). End diastolic cavity size is normal. End systolic cavity size is normal.   Prior study available for comparison from 03/16/2009.   ECHOCARDIOGRAM COMPLETE  Result Date: 03/29/2022    ECHOCARDIOGRAM REPORT   Patient Name:   JATIN NAUMANN Date of Exam: 03/29/2022 Medical Rec #:  [id number]      Height:       70.0 in Accession #:    [id number]     Weight:       218.0 lb Date of Birth:  06-12-1941     BSA:          2.165 m Patient Age:    80 years       BP:           107/69 mmHg Patient Gender: M              HR:  57 bpm. Exam Location:  [address] Procedure: 2D Echo, Cardiac Doppler, Color Doppler and Intracardiac            Opacification Agent Indications:    R01.1 Murmur  History:        Patient has no prior history of Echocardiogram examinations.                 Signs/Symptoms:Murmur; Risk Factors:Hypertension, Dyslipidemia                 and Diabetes.  Sonographer:    Samule Ohm RDCS Referring Phys: [phone] TRACI R TURNER  Sonographer Comments: Technically difficult study due to poor echo windows. IMPRESSIONS  1. Left ventricular ejection fraction, by estimation, is 60 to 65%. The left ventricle has normal  function. The left ventricle has no regional wall motion abnormalities. 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 Left ventricular diastolic parameters are consistent with Grade I diastolic dysfunction (impaired relaxation).  2. Right ventricular systolic function is normal. The right ventricular size is mildly enlarged. There is normal pulmonary artery systolic pressure. The estimated right ventricular systolic pressure is 23.8 mmHg.  3. Left atrial size was mildly dilated.  4. The mitral valve is normal in structure. Mild mitral valve regurgitation. No evidence of mitral stenosis.  5. Tricuspid valve regurgitation is moderate.  6. The aortic valve is normal in structure. There is mild calcification of the aortic valve. There is mild thickening of the aortic valve. Aortic valve regurgitation is not visualized. Moderate aortic valve stenosis. Aortic valve area, by VTI measures 1.34 cm. Aortic valve mean gradient measures 18.0 mmHg. Aortic valve Vmax measures 2.92 m/s.  7. Aortic dilatation noted. There is moderate dilatation of the ascending aorta, measuring 45 mm.  8. The inferior vena cava is normal in size with greater than 50% respiratory variability, suggesting right atrial pressure of 3 mmHg. FINDINGS  Left Ventricle: Left ventricular ejection fraction, by estimation, is 60 to 65%. The left ventricle has normal function. The left ventricle has no regional wall motion abnormalities. Definity contrast agent was given IV to delineate the left ventricular  endocardial borders. The left ventricular internal cavity size was normal in size. 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 Left ventricular diastolic parameters are consistent with Grade I diastolic dysfunction (impaired relaxation). Right Ventricle: The right ventricular size is mildly enlarged. No increase in right ventricular wall thickness. Right ventricular systolic  function is normal. There is normal pulmonary artery systolic pressure. The tricuspid regurgitant velocity is 2.28  m/s, and with an assumed right atrial pressure of 3 mmHg, the estimated right ventricular systolic pressure is 23.8 mmHg. Left Atrium: Left atrial size was mildly dilated. Right Atrium: Right atrial size was normal in size. Pericardium: There is no evidence of pericardial effusion. Mitral Valve: The mitral valve is normal in structure. Mild mitral valve regurgitation. No evidence of mitral valve stenosis. Tricuspid Valve: The tricuspid valve is normal in structure. Tricuspid valve regurgitation is moderate . No evidence of tricuspid stenosis. Aortic Valve: The aortic valve is normal in structure. There is mild calcification of the aortic valve. There is mild thickening of the aortic valve. Aortic valve regurgitation is not visualized. Moderate aortic stenosis is present. Aortic valve mean gradient measures 18.0 mmHg. Aortic valve peak gradient measures 34.2 mmHg. Aortic valve area, by VTI measures 1.34 cm. Pulmonic Valve: The pulmonic valve was normal in structure. Pulmonic valve regurgitation is not visualized. No evidence  of pulmonic stenosis. Aorta: The aortic root is normal in size and structure and aortic dilatation noted. There is moderate dilatation of the ascending aorta, measuring 45 mm. Venous: The inferior vena cava is normal in size with greater than 50% respiratory variability, suggesting right atrial pressure of 3 mmHg. IAS/Shunts: No atrial level shunt detected by color flow Doppler.  LEFT VENTRICLE PLAX 2D LVIDd:         4.90 cm   Diastology LVIDs:         2.90 cm   LV e' medial:    5.44 cm/s LV PW:         1.10 cm   LV E/e' medial:  17.3 LV IVS:        1.60 cm   LV e' lateral:   9.79 cm/s LVOT diam:     2.30 cm   LV E/e' lateral: 9.6 LV SV:         93 LV SV Index:   43 LVOT Area:     4.15 cm  RIGHT VENTRICLE RVSP:           23.8 mmHg LEFT ATRIUM             Index        RIGHT ATRIUM            Index LA diam:        5.50 cm 2.54 cm/m   RA Pressure: 3.00 mmHg LA Vol (A2C):   59.7 ml 27.57 ml/m  RA Area:     14.60 cm LA Vol (A4C):   83.7 ml 38.66 ml/m  RA Volume:   28.80 ml  13.30 ml/m LA Biplane Vol: 73.0 ml 33.72 ml/m  AORTIC VALVE AV Area (Vmax):    1.43 cm AV Area (Vmean):   1.45 cm AV Area (VTI):     1.34 cm AV Vmax:           292.50 cm/s AV Vmean:          196.500 cm/s AV VTI:            0.692 m AV Peak Grad:      34.2 mmHg AV Mean Grad:      18.0 mmHg LVOT Vmax:         101.00 cm/s LVOT Vmean:        68.400 cm/s LVOT VTI:          0.224 m LVOT/AV VTI ratio: 0.32  AORTA Ao Root diam: 4.10 cm Ao Asc diam:  4.50 cm MITRAL VALVE                TRICUSPID VALVE MV Area (PHT): 3.40 cm     TR Peak grad:   20.8 mmHg MV Decel Time: 223 msec     TR Vmax:        228.00 cm/s MV E velocity: 94.30 cm/s   Estimated RAP:  3.00 mmHg MV A velocity: 103.00 cm/s  RVSP:           23.8 mmHg MV E/A ratio:  0.92                             SHUNTS                             Systemic VTI:  0.22 m  Systemic Diam: 2.30 cm Donato Schultz MD Electronically signed by Donato Schultz MD Signature Date/Time: 03/29/2022/1:34:26 PM    Final     Assessment & Plan:   Problem List Items Addressed This Visit     HTN (hypertension)     On HCTZ Norvasc 10 mg/d      DM (diabetes mellitus) (HCC) - Primary     Cont w/ Metformin, glipizide and Actos. On Lipitor now Loose a few lbs       Relevant Orders   Comprehensive metabolic panel   Hemoglobin A1c   Hyperlipidemia     On Lipitor and krill oil      Ascending aortic aneurysm (HCC)    4.5 cm by 2D echo 03/2022 Will repeat another ECHO at some point BP control is good      Osteoarthritis    On Vit D, krill oil Tramadol prn  Potential benefits of a long term opioids use as well as potential risks (i.e. addiction risk, apnea etc) and complications (i.e. Somnolence, constipation and others) were explained to the patient and were  aknowledged.      Relevant Medications   traMADol (ULTRAM) 50 MG tablet      Meds ordered this encounter  Medications   traMADol (ULTRAM) 50 MG tablet    Sig: TAKE 1 TABLET BY MOUTH EVERY 8 HOURS AS NEEDED FOR SEVERE PAIN.    Dispense:  90 tablet    Refill:  2    This request is for a new prescription for a controlled substance as required by Federal/State law..      Follow-up: Return in about 3 months (around 08/07/2023) for a follow-up visit.  Sonda Primes, MD

## 2023-05-07 NOTE — Assessment & Plan Note (Signed)
  Cont w/ Metformin, glipizide and Actos. On Lipitor now Loose a few lbs  

## 2023-05-07 NOTE — Assessment & Plan Note (Signed)
On Vit D, krill oil Tramadol prn  Potential benefits of a long term opioids use as well as potential risks (i.e. addiction risk, apnea etc) and complications (i.e. Somnolence, constipation and others) were explained to the patient and were aknowledged. 

## 2023-05-07 NOTE — Assessment & Plan Note (Signed)
On Lipitor and krill oil 

## 2023-05-07 NOTE — Assessment & Plan Note (Signed)
4.5 cm by 2D echo 03/2022 Will repeat another ECHO at some point BP control is good

## 2023-05-07 NOTE — Assessment & Plan Note (Signed)
  On HCTZ Norvasc 10 mg/d 

## 2023-08-07 ENCOUNTER — Encounter: Payer: Self-pay | Admitting: Internal Medicine

## 2023-08-07 ENCOUNTER — Ambulatory Visit: Payer: Medicare Other | Admitting: Internal Medicine

## 2023-08-07 VITALS — BP 122/80 | HR 62 | Temp 98.0°F | Ht 70.0 in | Wt 214.0 lb

## 2023-08-07 DIAGNOSIS — Z7984 Long term (current) use of oral hypoglycemic drugs: Secondary | ICD-10-CM | POA: Diagnosis not present

## 2023-08-07 DIAGNOSIS — M1991 Primary osteoarthritis, unspecified site: Secondary | ICD-10-CM

## 2023-08-07 DIAGNOSIS — R053 Chronic cough: Secondary | ICD-10-CM

## 2023-08-07 DIAGNOSIS — E782 Mixed hyperlipidemia: Secondary | ICD-10-CM

## 2023-08-07 DIAGNOSIS — E1159 Type 2 diabetes mellitus with other circulatory complications: Secondary | ICD-10-CM

## 2023-08-07 DIAGNOSIS — I10 Essential (primary) hypertension: Secondary | ICD-10-CM

## 2023-08-07 DIAGNOSIS — J40 Bronchitis, not specified as acute or chronic: Secondary | ICD-10-CM | POA: Diagnosis not present

## 2023-08-07 LAB — COMPREHENSIVE METABOLIC PANEL
ALT: 17 U/L (ref 0–53)
AST: 25 U/L (ref 0–37)
Albumin: 4.1 g/dL (ref 3.5–5.2)
Alkaline Phosphatase: 82 U/L (ref 39–117)
BUN: 22 mg/dL (ref 6–23)
CO2: 27 meq/L (ref 19–32)
Calcium: 10 mg/dL (ref 8.4–10.5)
Chloride: 101 meq/L (ref 96–112)
Creatinine, Ser: 0.99 mg/dL (ref 0.40–1.50)
GFR: 71.2 mL/min (ref >60.00)
Glucose, Bld: 96 mg/dL (ref 70–99)
Potassium: 4.2 meq/L (ref 3.5–5.1)
Sodium: 138 meq/L (ref 135–145)
Total Bilirubin: 0.7 mg/dL (ref 0.2–1.2)
Total Protein: 7 g/dL (ref 6.0–8.3)

## 2023-08-07 LAB — HEMOGLOBIN A1C: Hgb A1c MFr Bld: 8.2 % — ABNORMAL HIGH (ref 4.6–6.5)

## 2023-08-07 MED ORDER — CEFDINIR 300 MG PO CAPS: 300.0000 mg | ORAL_CAPSULE | Freq: Two times a day (BID) | ORAL | 0 refills | Status: DC

## 2023-08-07 NOTE — Assessment & Plan Note (Signed)
  Cont w/ Metformin, glipizide and Actos. On Lipitor now Loose a few lbs  

## 2023-08-07 NOTE — Progress Notes (Signed)
Subjective:  Patient ID: Kevin Weaver, male    DOB: August 09, 1941  Age: 82 y.o. MRN: [id number]  CC: Follow-up (3 MNT F/U)   HPI Kevin Weaver presents for DM, HTN, OA C/o bronchitis, productive cough  Outpatient Medications Prior to Visit  Medication Sig Dispense Refill   amLODipine (NORVASC) 10 MG tablet Take 1 tablet (10 mg total) by mouth daily. 90 tablet 3   atorvastatin (LIPITOR) 40 MG tablet Take 1 tablet (40 mg total) by mouth daily. 90 tablet 3   Cyanocobalamin (B-12) 5000 MCG CAPS Take by mouth daily with breakfast.     glipiZIDE (GLUCOTROL XL) 10 MG 24 hr tablet Take 1 tablet (10 mg total) by mouth 2 (two) times daily. 180 tablet 3   hydrochlorothiazide (HYDRODIURIL) 25 MG tablet Take 1 tablet (25 mg total) by mouth daily. 90 tablet 3   metFORMIN (GLUCOPHAGE) 1000 MG tablet TAKE 1 TABLET BY MOUTH TWICE A DAY WITH A MEAL 180 tablet 3   omeprazole (PRILOSEC) 20 MG capsule TAKE 1 CAPSULE BY MOUTH EVERY DAY 90 capsule 3   pioglitazone (ACTOS) 45 MG tablet Take 1 tablet (45 mg total) by mouth daily. 90 tablet 3   potassium chloride SA (KLOR-CON M20) 20 MEQ tablet Take 1 tablet (20 mEq total) by mouth daily. 90 tablet 3   traMADol (ULTRAM) 50 MG tablet TAKE 1 TABLET BY MOUTH EVERY 8 HOURS AS NEEDED FOR SEVERE PAIN. 90 tablet 2   cefdinir (OMNICEF) 300 MG capsule Take 1 capsule (300 mg total) by mouth 2 (two) times daily. 20 capsule 0   No facility-administered medications prior to visit.    ROS: Review of Systems  Constitutional:  Negative for appetite change, fatigue and unexpected weight change.  HENT:  Negative for congestion, nosebleeds, sneezing, sore throat and trouble swallowing.   Eyes:  Negative for itching and visual disturbance.  Respiratory:  Negative for cough.   Cardiovascular:  Negative for chest pain, palpitations and leg swelling.  Gastrointestinal:  Negative for abdominal distention, blood in stool, diarrhea and nausea.  Genitourinary:  Negative for  frequency and hematuria.  Musculoskeletal:  Positive for arthralgias. Negative for back pain, gait problem, joint swelling and neck pain.  Skin:  Negative for rash.  Neurological:  Negative for dizziness, tremors, speech difficulty and weakness.  Psychiatric/Behavioral:  Negative for agitation, dysphoric mood and sleep disturbance. The patient is not nervous/anxious.     Objective:  BP 122/80 (BP Location: Left Arm, Patient Position: Sitting, Cuff Size: Normal)   Pulse 62   Temp 98 F (36.7 C) (Oral)   Ht 5\' 10"  (1.778 m)   Wt 214 lb (97.1 kg)   SpO2 92%   BMI 30.71 kg/m   BP Readings from Last 3 Encounters:  08/07/23 122/80  05/07/23 118/68  02/05/23 130/80    Wt Readings from Last 3 Encounters:  08/07/23 214 lb (97.1 kg)  05/07/23 216 lb (98 kg)  02/05/23 220 lb (99.8 kg)    Physical Exam Constitutional:      General: He is not in acute distress.    Appearance: He is well-developed. He is obese.     Comments: NAD  Eyes:     Conjunctiva/sclera: Conjunctivae normal.     Pupils: Pupils are equal, round, and reactive to light.  Neck:     Thyroid: No thyromegaly.     Vascular: No JVD.  Cardiovascular:     Rate and Rhythm: Normal rate and regular rhythm.  Heart sounds: Normal heart sounds. No murmur heard.    No friction rub. No gallop.  Pulmonary:     Effort: Pulmonary effort is normal. No respiratory distress.     Breath sounds: Normal breath sounds. No wheezing or rales.  Chest:     Chest wall: No tenderness.  Abdominal:     General: Bowel sounds are normal. There is no distension.     Palpations: Abdomen is soft. There is no mass.     Tenderness: There is no abdominal tenderness. There is no guarding or rebound.  Musculoskeletal:        General: No tenderness. Normal range of motion.     Cervical back: Normal range of motion.  Lymphadenopathy:     Cervical: No cervical adenopathy.  Skin:    General: Skin is warm and dry.     Findings: No rash.   Neurological:     Mental Status: He is alert and oriented to person, place, and time.     Cranial Nerves: No cranial nerve deficit.     Motor: No abnormal muscle tone.     Coordination: Coordination normal.     Gait: Gait normal.     Deep Tendon Reflexes: Reflexes are normal and symmetric.  Psychiatric:        Behavior: Behavior normal.        Thought Content: Thought content normal.        Judgment: Judgment normal.     Lab Results  Component Value Date   WBC 7.1 10/17/2022   HGB 12.2 (L) 10/17/2022   HCT 37.7 (L) 10/17/2022   PLT 259.0 10/17/2022   GLUCOSE 82 05/07/2023   CHOL 130 10/17/2022   TRIG 59.0 10/17/2022   HDL 63.00 10/17/2022   LDLCALC 55 10/17/2022   ALT 13 05/07/2023   AST 21 05/07/2023   NA 137 05/07/2023   K 4.5 05/07/2023   CL 100 05/07/2023   CREATININE 1.09 05/07/2023   BUN 18 05/07/2023   CO2 26 05/07/2023   TSH 1.92 10/17/2022   PSA 0.32 10/17/2022   HGBA1C 8.0 (H) 05/07/2023   MICROALBUR <0.7 12/18/2020    VAS US CAROTID  Result Date: 03/30/2022 Carotid Arterial Duplex Study Patient Name:  Kevin Weaver Laflam  Date of Exam:   03/28/2022 Medical Rec #: [id number]       Accession #:    [id number] Date of Birth: Apr 12, 1941      Patient Gender: M Patient Age:   82 years Exam Location:  [facility] Procedure:      VAS US CAROTID Referring Phys: Gloris Manchester TURNER --------------------------------------------------------------------------------  Indications:       Carotid artery stenosis. Patient denies cerebrovascular                    symptoms but does complain that his fingers on the left hand                    will sometimes feel cold and numb. Risk Factors:      Hypertension, Diabetes, no history of smoking. Comparison Study:  Previous carotid duplex performed 12/27/20 showed RICA                    velocities of 47/21 cm/sec and LICA velocities of 150/45                    cm/sec. Performing Technologist: Olegario Shearer RVT  Examination Guidelines: A complete  evaluation includes B-mode imaging,  spectral Doppler, color Doppler, and power Doppler as needed of all accessible portions of each vessel. Bilateral testing is considered an integral part of a complete examination. Limited examinations for reoccurring indications may be performed as noted.  Right Carotid Findings: +----------+--------+--------+--------+------------------+--------+           PSV cm/sEDV cm/sStenosisPlaque DescriptionComments +----------+--------+--------+--------+------------------+--------+ CCA Prox  52      11                                         +----------+--------+--------+--------+------------------+--------+ CCA Distal62      16                                         +----------+--------+--------+--------+------------------+--------+ ICA Prox  63      21              heterogenous               +----------+--------+--------+--------+------------------+--------+ ICA Mid   67      21      1-39%                              +----------+--------+--------+--------+------------------+--------+ ICA Distal71      32                                         +----------+--------+--------+--------+------------------+--------+ ECA       54      14                                         +----------+--------+--------+--------+------------------+--------+ +----------+--------+-------+----------------+-------------------+           PSV cm/sEDV cmsDescribe        Arm Pressure (mmHG) +----------+--------+-------+----------------+-------------------+ [id number]             Multiphasic, [id number]                 +----------+--------+-------+----------------+-------------------+ +---------+--------+-+--------+-+------+ VertebralPSV cm/s0EDV cm/s0Absent +---------+--------+-+--------+-+------+  Left Carotid Findings: +----------+--------+--------+--------+-------------------------+--------+           PSV cm/sEDV cm/sStenosisPlaque  Description       Comments +----------+--------+--------+--------+-------------------------+--------+ CCA Prox  66      17                                                +----------+--------+--------+--------+-------------------------+--------+ CCA Distal55      16                                                +----------+--------+--------+--------+-------------------------+--------+ ICA Prox  133     49              heterogenous                      +----------+--------+--------+--------+-------------------------+--------+ ICA Mid  166     55      40-59%  irregular and hyperechoic         +----------+--------+--------+--------+-------------------------+--------+ ICA Distal43      15                                                +----------+--------+--------+--------+-------------------------+--------+ ECA       47      12                                                +----------+--------+--------+--------+-------------------------+--------+ +----------+--------+--------+----------------+-------------------+           PSV cm/sEDV cm/sDescribe        Arm Pressure (mmHG) +----------+--------+--------+----------------+-------------------+ Subclavian80      0       Multiphasic, [id number]                 +----------+--------+--------+----------------+-------------------+ +---------+--------+--+--------+--+---------+ VertebralPSV cm/s42EDV cm/s17Antegrade +---------+--------+--+--------+--+---------+   Summary: Right Carotid: Velocities in the right ICA are consistent with a 1-39% stenosis. 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 Vertebrals:  Left vertebral artery demonstrates antegrade flow. Right vertebral              artery demonstrates an occlusion. Subclavians: Normal flow hemodynamics were seen in bilateral subclavian              arteries. *See table(s) above for measurements and observations. Suggest follow up  study in 12 months. Electronically signed by Dina Rich MD on 03/30/2022 at 3:45:36 PM.    Final    MYOCARDIAL PERFUSION IMAGING  Result Date: 03/29/2022   The study is normal. Findings are consistent with no prior ischemia and no prior myocardial infarction. The study is low risk.   No ST deviation was noted.   LV perfusion is normal. There is no evidence of ischemia. There is no evidence of infarction.   Left ventricular function is normal. Nuclear stress EF: 64 %. The left ventricular ejection fraction is normal (55-65%). End diastolic cavity size is normal. End systolic cavity size is normal.   Prior study available for comparison from 03/16/2009.   ECHOCARDIOGRAM COMPLETE  Result Date: 03/29/2022    ECHOCARDIOGRAM REPORT   Patient Name:   TRAVEN DAVIDS Sachse Date of Exam: 03/29/2022 Medical Rec #:  [id number]      Height:       70.0 in Accession #:    [id number]     Weight:       218.0 lb Date of Birth:  04/11/1941     BSA:          2.165 m Patient Age:    80 years       BP:           107/69 mmHg Patient Gender: M              HR:           57 bpm. Exam Location:  [address] Procedure: 2D Echo, Cardiac Doppler, Color Doppler and Intracardiac            Opacification Agent Indications:    R01.1 Murmur  History:        Patient has no prior history  of Echocardiogram examinations.                 Signs/Symptoms:Murmur; Risk Factors:Hypertension, Dyslipidemia                 and Diabetes.  Sonographer:    Samule Ohm RDCS Referring Phys: [phone] TRACI R TURNER  Sonographer Comments: Technically difficult study due to poor echo windows. IMPRESSIONS  1. Left ventricular ejection fraction, by estimation, is 60 to 65%. The left ventricle has normal function. The left ventricle has no regional wall motion abnormalities. 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 Left ventricular diastolic parameters are consistent with Grade I diastolic dysfunction (impaired relaxation).   2. Right ventricular systolic function is normal. The right ventricular size is mildly enlarged. There is normal pulmonary artery systolic pressure. The estimated right ventricular systolic pressure is 23.8 mmHg.  3. Left atrial size was mildly dilated.  4. The mitral valve is normal in structure. Mild mitral valve regurgitation. No evidence of mitral stenosis.  5. Tricuspid valve regurgitation is moderate.  6. The aortic valve is normal in structure. There is mild calcification of the aortic valve. There is mild thickening of the aortic valve. Aortic valve regurgitation is not visualized. Moderate aortic valve stenosis. Aortic valve area, by VTI measures 1.34 cm. Aortic valve mean gradient measures 18.0 mmHg. Aortic valve Vmax measures 2.92 m/s.  7. Aortic dilatation noted. There is moderate dilatation of the ascending aorta, measuring 45 mm.  8. The inferior vena cava is normal in size with greater than 50% respiratory variability, suggesting right atrial pressure of 3 mmHg. FINDINGS  Left Ventricle: Left ventricular ejection fraction, by estimation, is 60 to 65%. The left ventricle has normal function. The left ventricle has no regional wall motion abnormalities. Definity contrast agent was given IV to delineate the left ventricular  endocardial borders. The left ventricular internal cavity size was normal in size. 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 Left ventricular diastolic parameters are consistent with Grade I diastolic dysfunction (impaired relaxation). Right Ventricle: The right ventricular size is mildly enlarged. No increase in right ventricular wall thickness. Right ventricular systolic function is normal. There is normal pulmonary artery systolic pressure. The tricuspid regurgitant velocity is 2.28  m/s, and with an assumed right atrial pressure of 3 mmHg, the estimated right ventricular systolic pressure is 23.8 mmHg. Left Atrium: Left atrial size  was mildly dilated. Right Atrium: Right atrial size was normal in size. Pericardium: There is no evidence of pericardial effusion. Mitral Valve: The mitral valve is normal in structure. Mild mitral valve regurgitation. No evidence of mitral valve stenosis. Tricuspid Valve: The tricuspid valve is normal in structure. Tricuspid valve regurgitation is moderate . No evidence of tricuspid stenosis. Aortic Valve: The aortic valve is normal in structure. There is mild calcification of the aortic valve. There is mild thickening of the aortic valve. Aortic valve regurgitation is not visualized. Moderate aortic stenosis is present. Aortic valve mean gradient measures 18.0 mmHg. Aortic valve peak gradient measures 34.2 mmHg. Aortic valve area, by VTI measures 1.34 cm. Pulmonic Valve: The pulmonic valve was normal in structure. Pulmonic valve regurgitation is not visualized. No evidence of pulmonic stenosis. Aorta: The aortic root is normal in size and structure and aortic dilatation noted. There is moderate dilatation of the ascending aorta, measuring 45 mm. Venous: The inferior vena cava is normal in size with greater than 50% respiratory variability, suggesting right atrial pressure of 3  mmHg. IAS/Shunts: No atrial level shunt detected by color flow Doppler.  LEFT VENTRICLE PLAX 2D LVIDd:         4.90 cm   Diastology LVIDs:         2.90 cm   LV e' medial:    5.44 cm/s LV PW:         1.10 cm   LV E/e' medial:  17.3 LV IVS:        1.60 cm   LV e' lateral:   9.79 cm/s LVOT diam:     2.30 cm   LV E/e' lateral: 9.6 LV SV:         93 LV SV Index:   43 LVOT Area:     4.15 cm  RIGHT VENTRICLE RVSP:           23.8 mmHg LEFT ATRIUM             Index        RIGHT ATRIUM           Index LA diam:        5.50 cm 2.54 cm/m   RA Pressure: 3.00 mmHg LA Vol (A2C):   59.7 ml 27.57 ml/m  RA Area:     14.60 cm LA Vol (A4C):   83.7 ml 38.66 ml/m  RA Volume:   28.80 ml  13.30 ml/m LA Biplane Vol: 73.0 ml 33.72 ml/m  AORTIC VALVE AV Area  (Vmax):    1.43 cm AV Area (Vmean):   1.45 cm AV Area (VTI):     1.34 cm AV Vmax:           292.50 cm/s AV Vmean:          196.500 cm/s AV VTI:            0.692 m AV Peak Grad:      34.2 mmHg AV Mean Grad:      18.0 mmHg LVOT Vmax:         101.00 cm/s LVOT Vmean:        68.400 cm/s LVOT VTI:          0.224 m LVOT/AV VTI ratio: 0.32  AORTA Ao Root diam: 4.10 cm Ao Asc diam:  4.50 cm MITRAL VALVE                TRICUSPID VALVE MV Area (PHT): 3.40 cm     TR Peak grad:   20.8 mmHg MV Decel Time: 223 msec     TR Vmax:        228.00 cm/s MV E velocity: 94.30 cm/s   Estimated RAP:  3.00 mmHg MV A velocity: 103.00 cm/s  RVSP:           23.8 mmHg MV E/A ratio:  0.92                             SHUNTS                             Systemic VTI:  0.22 m                             Systemic Diam: 2.30 cm Donato Schultz MD Electronically signed by Donato Schultz MD Signature Date/Time: 03/29/2022/1:34:26 PM    Final     Assessment & Plan:   Problem List Items  Addressed This Visit     HTN (hypertension) - Primary     On HCTZ Norvasc 10 mg/d      Relevant Orders   Comprehensive metabolic panel   DM (diabetes mellitus) (HCC)     Cont w/ Metformin, glipizide and Actos. On Lipitor now Loose a few lbs       Relevant Orders   Comprehensive metabolic panel   Hemoglobin A1c   Hyperlipidemia    On Lipitor and krill oil      Cough    Bronchitis, productive cough - Rx Omnicef      Osteoarthritis    On Vit D, krill oil Tramadol prn  Potential benefits of a long term opioids use as well as potential risks (i.e. addiction risk, apnea etc) and complications (i.e. Somnolence, constipation and others) were explained to the patient and were aknowledged.      Bronchitis    Bronchitis, productive cough - Rx Omnicef         Meds ordered this encounter  Medications   cefdinir (OMNICEF) 300 MG capsule    Sig: Take 1 capsule (300 mg total) by mouth 2 (two) times daily.    Dispense:  20 capsule    Refill:  0       Follow-up: Return in about 3 months (around 11/07/2023) for a follow-up visit.  Sonda Primes, MD

## 2023-08-07 NOTE — Assessment & Plan Note (Signed)
Bronchitis, productive cough - Rx [employer]

## 2023-08-07 NOTE — Assessment & Plan Note (Signed)
On Lipitor and krill oil 

## 2023-08-07 NOTE — Assessment & Plan Note (Signed)
On Vit D, krill oil Tramadol prn  Potential benefits of a long term opioids use as well as potential risks (i.e. addiction risk, apnea etc) and complications (i.e. Somnolence, constipation and others) were explained to the patient and were aknowledged.

## 2023-08-07 NOTE — Assessment & Plan Note (Signed)
  On HCTZ Norvasc 10 mg/d

## 2023-08-09 ENCOUNTER — Other Ambulatory Visit: Payer: Self-pay | Admitting: Internal Medicine

## 2023-08-12 ENCOUNTER — Other Ambulatory Visit: Payer: Self-pay | Admitting: Internal Medicine

## 2023-11-10 ENCOUNTER — Ambulatory Visit (INDEPENDENT_AMBULATORY_CARE_PROVIDER_SITE_OTHER): Payer: Medicare Other | Admitting: Internal Medicine

## 2023-11-10 ENCOUNTER — Encounter: Payer: Self-pay | Admitting: Internal Medicine

## 2023-11-10 VITALS — HR 46 | Temp 98.3°F | Ht 70.0 in | Wt 221.0 lb

## 2023-11-10 DIAGNOSIS — E1159 Type 2 diabetes mellitus with other circulatory complications: Secondary | ICD-10-CM

## 2023-11-10 DIAGNOSIS — M25512 Pain in left shoulder: Secondary | ICD-10-CM

## 2023-11-10 DIAGNOSIS — Z7984 Long term (current) use of oral hypoglycemic drugs: Secondary | ICD-10-CM | POA: Diagnosis not present

## 2023-11-10 DIAGNOSIS — M19072 Primary osteoarthritis, left ankle and foot: Secondary | ICD-10-CM | POA: Diagnosis not present

## 2023-11-10 DIAGNOSIS — Z6835 Body mass index (BMI) 35.0-35.9, adult: Secondary | ICD-10-CM | POA: Diagnosis not present

## 2023-11-10 DIAGNOSIS — M25511 Pain in right shoulder: Secondary | ICD-10-CM

## 2023-11-10 DIAGNOSIS — G8929 Other chronic pain: Secondary | ICD-10-CM | POA: Diagnosis not present

## 2023-11-10 DIAGNOSIS — M19071 Primary osteoarthritis, right ankle and foot: Secondary | ICD-10-CM | POA: Diagnosis not present

## 2023-11-10 DIAGNOSIS — I10 Essential (primary) hypertension: Secondary | ICD-10-CM

## 2023-11-10 DIAGNOSIS — N320 Bladder-neck obstruction: Secondary | ICD-10-CM | POA: Diagnosis not present

## 2023-11-10 LAB — COMPREHENSIVE METABOLIC PANEL
ALT: 17 U/L (ref 0–53)
AST: 22 U/L (ref 0–37)
Albumin: 4.1 g/dL (ref 3.5–5.2)
Alkaline Phosphatase: 80 U/L (ref 39–117)
BUN: 18 mg/dL (ref 6–23)
CO2: 26 meq/L (ref 19–32)
Calcium: 9.6 mg/dL (ref 8.4–10.5)
Chloride: 100 meq/L (ref 96–112)
Creatinine, Ser: 0.92 mg/dL (ref 0.40–1.50)
GFR: 77.61 mL/min (ref >60.00)
Glucose, Bld: 59 mg/dL — ABNORMAL LOW (ref 70–99)
Potassium: 4.3 meq/L (ref 3.5–5.1)
Sodium: 137 meq/L (ref 135–145)
Total Bilirubin: 0.6 mg/dL (ref 0.2–1.2)
Total Protein: 7 g/dL (ref 6.0–8.3)

## 2023-11-10 LAB — URINALYSIS
Bilirubin Urine: NEGATIVE
Hgb urine dipstick: NEGATIVE
Ketones, ur: NEGATIVE
Leukocytes,Ua: NEGATIVE
Nitrite: NEGATIVE
Specific Gravity, Urine: 1.02 (ref 1.000–1.030)
Total Protein, Urine: NEGATIVE
Urine Glucose: NEGATIVE
Urobilinogen, UA: 0.2 (ref 0.0–1.0)
pH: 6 (ref 5.0–8.0)

## 2023-11-10 LAB — MICROALBUMIN / CREATININE URINE RATIO
Creatinine,U: 96.6 mg/dL
Microalb Creat Ratio: 0.8 mg/g (ref 0.0–30.0)
Microalb, Ur: 0.8 mg/dL (ref 0.0–1.9)

## 2023-11-10 LAB — TSH: TSH: 2.94 u[IU]/mL (ref 0.35–5.50)

## 2023-11-10 LAB — CBC WITH DIFFERENTIAL/PLATELET
Basophils Absolute: 0 10*3/uL (ref 0.0–0.1)
Basophils Relative: 0.9 % (ref 0.0–3.0)
Eosinophils Absolute: 0.1 10*3/uL (ref 0.0–0.7)
Eosinophils Relative: 1.7 % (ref 0.0–5.0)
HCT: 35.3 % — ABNORMAL LOW (ref 39.0–52.0)
Hemoglobin: 11.2 g/dL — ABNORMAL LOW (ref 13.0–17.0)
Lymphocytes Relative: 22.5 % (ref 12.0–46.0)
Lymphs Abs: 1.2 10*3/uL (ref 0.7–4.0)
MCHC: 31.8 g/dL (ref 30.0–36.0)
MCV: 87.7 fL (ref 78.0–100.0)
Monocytes Absolute: 0.7 10*3/uL (ref 0.1–1.0)
Monocytes Relative: 13.5 % — ABNORMAL HIGH (ref 3.0–12.0)
Neutro Abs: 3.3 10*3/uL (ref 1.4–7.7)
Neutrophils Relative %: 61.4 % (ref 43.0–77.0)
Platelets: 281 10*3/uL (ref 150.0–400.0)
RBC: 4.02 Mil/uL — ABNORMAL LOW (ref 4.22–5.81)
RDW: 18.3 % — ABNORMAL HIGH (ref 11.5–15.5)
WBC: 5.3 10*3/uL (ref 4.0–10.5)

## 2023-11-10 LAB — LIPID PANEL
Cholesterol: 108 mg/dL (ref 0–200)
HDL: 51.5 mg/dL (ref >39.00)
LDL Cholesterol: 44 mg/dL (ref 0–99)
NonHDL: 56.67
Total CHOL/HDL Ratio: 2
Triglycerides: 63 mg/dL (ref 0.0–149.0)
VLDL: 12.6 mg/dL (ref 0.0–40.0)

## 2023-11-10 LAB — PSA: PSA: 0.35 ng/mL (ref 0.10–4.00)

## 2023-11-10 LAB — HEMOGLOBIN A1C: Hgb A1c MFr Bld: 7.8 % — ABNORMAL HIGH (ref 4.6–6.5)

## 2023-11-10 NOTE — Assessment & Plan Note (Addendum)
 Chronic pain Cont on Tramadol  prn  Potential benefits of a long term opioids use as well as potential risks (i.e. addiction risk, apnea etc) and complications (i.e. Somnolence, constipation and others) were explained to the patient and were aknowledged. Using a cane

## 2023-11-10 NOTE — Assessment & Plan Note (Addendum)
 R>>L Taking Mag 400 mg a day Doing well on current meds

## 2023-11-10 NOTE — Assessment & Plan Note (Signed)
  On HCTZ Norvasc 10 mg/d

## 2023-11-10 NOTE — Progress Notes (Signed)
 Subjective:  Patient ID: Kevin Weaver, male    DOB: 1940/12/14  Age: 83 y.o. MRN: [id number]  CC: Medical Management of Chronic Issues (4 MNTH F/U)   HPI Kevin Weaver presents for HTN, DM, OA  Outpatient Medications Prior to Visit  Medication Sig Dispense Refill   amLODipine  (NORVASC ) 10 MG tablet TAKE 1 TABLET BY MOUTH EVERY DAY 90 tablet 3   atorvastatin  (LIPITOR) 40 MG tablet TAKE 1 TABLET BY MOUTH EVERY DAY 90 tablet 3   Cyanocobalamin (B-12) 5000 MCG CAPS Take by mouth daily with breakfast.     glipiZIDE  (GLUCOTROL  XL) 10 MG 24 hr tablet TAKE 1 TABLET BY MOUTH TWICE A DAY 180 tablet 3   hydrochlorothiazide  (HYDRODIURIL ) 25 MG tablet TAKE 1 TABLET (25 MG TOTAL) BY MOUTH DAILY. 90 tablet 3   KLOR-CON  M20 20 MEQ tablet TAKE 1 TABLET BY MOUTH EVERY DAY 90 tablet 3   metFORMIN  (GLUCOPHAGE ) 1000 MG tablet TAKE 1 TABLET BY MOUTH TWICE A DAY WITH FOOD 180 tablet 3   omeprazole  (PRILOSEC) 20 MG capsule TAKE 1 CAPSULE BY MOUTH EVERY DAY 90 capsule 3   pioglitazone  (ACTOS ) 45 MG tablet TAKE 1 TABLET BY MOUTH EVERY DAY 90 tablet 3   traMADol  (ULTRAM ) 50 MG tablet TAKE 1 TABLET BY MOUTH EVERY 8 HOURS AS NEEDED FOR SEVERE PAIN. 90 tablet 2   cefdinir  (OMNICEF ) 300 MG capsule Take 1 capsule (300 mg total) by mouth 2 (two) times daily. 20 capsule 0   No facility-administered medications prior to visit.    ROS: Review of Systems  Constitutional:  Negative for appetite change, fatigue and unexpected weight change.  HENT:  Negative for congestion, nosebleeds, sneezing, sore throat and trouble swallowing.   Eyes:  Negative for itching and visual disturbance.  Respiratory:  Negative for cough.   Cardiovascular:  Negative for chest pain, palpitations and leg swelling.  Gastrointestinal:  Negative for abdominal distention, blood in stool, diarrhea and nausea.  Genitourinary:  Negative for frequency and hematuria.  Musculoskeletal:  Positive for arthralgias. Negative for back pain, gait  problem, joint swelling and neck pain.  Skin:  Negative for rash.  Neurological:  Negative for dizziness, tremors, speech difficulty and weakness.  Psychiatric/Behavioral:  Negative for agitation, dysphoric mood, sleep disturbance and suicidal ideas. The patient is not nervous/anxious.     Objective:  Pulse (!) 46   Temp 98.3 F (36.8 C) (Oral)   Ht 5' 10 (1.778 m)   Wt 221 lb (100.2 kg)   SpO2 90%   BMI 31.71 kg/m   BP Readings from Last 3 Encounters:  08/07/23 122/80  05/07/23 118/68  02/05/23 130/80    Wt Readings from Last 3 Encounters:  11/10/23 221 lb (100.2 kg)  08/07/23 214 lb (97.1 kg)  05/07/23 216 lb (98 kg)    Physical Exam Constitutional:      General: He is not in acute distress.    Appearance: He is well-developed. He is obese.     Comments: NAD  Eyes:     Conjunctiva/sclera: Conjunctivae normal.     Pupils: Pupils are equal, round, and reactive to light.  Neck:     Thyroid : No thyromegaly.     Vascular: No JVD.  Cardiovascular:     Rate and Rhythm: Normal rate and regular rhythm.     Heart sounds: Normal heart sounds. No murmur heard.    No friction rub. No gallop.  Pulmonary:     Effort: Pulmonary effort is normal.  No respiratory distress.     Breath sounds: Normal breath sounds. No wheezing or rales.  Chest:     Chest wall: No tenderness.  Abdominal:     General: Bowel sounds are normal. There is no distension.     Palpations: Abdomen is soft. There is no mass.     Tenderness: There is no abdominal tenderness. There is no guarding or rebound.  Musculoskeletal:        General: Deformity present. No tenderness. Normal range of motion.     Cervical back: Normal range of motion.     Right lower leg: No edema.     Left lower leg: No edema.  Lymphadenopathy:     Cervical: No cervical adenopathy.  Skin:    General: Skin is warm and dry.     Findings: No rash.  Neurological:     Mental Status: He is alert and oriented to person, place, and  time.     Cranial Nerves: No cranial nerve deficit.     Motor: No abnormal muscle tone.     Coordination: Coordination abnormal.     Gait: Gait abnormal.     Deep Tendon Reflexes: Reflexes are normal and symmetric.  Psychiatric:        Behavior: Behavior normal.        Thought Content: Thought content normal.        Judgment: Judgment normal.   Using a cane  Lab Results  Component Value Date   WBC 7.1 10/17/2022   HGB 12.2 (L) 10/17/2022   HCT 37.7 (L) 10/17/2022   PLT 259.0 10/17/2022   GLUCOSE 96 08/07/2023   CHOL 130 10/17/2022   TRIG 59.0 10/17/2022   HDL 63.00 10/17/2022   LDLCALC 55 10/17/2022   ALT 17 08/07/2023   AST 25 08/07/2023   NA 138 08/07/2023   K 4.2 08/07/2023   CL 101 08/07/2023   CREATININE 0.99 08/07/2023   BUN 22 08/07/2023   CO2 27 08/07/2023   TSH 1.92 10/17/2022   PSA 0.32 10/17/2022   HGBA1C 8.2 (H) 08/07/2023   MICROALBUR <0.7 12/18/2020    VAS US  CAROTID Result Date: 03/30/2022 Carotid Arterial Duplex Study Patient Name:  Kevin Weaver  Date of Exam:   03/28/2022 Medical Rec #: [id number]       Accession #:    [id number] Date of Birth: 07-04-1941      Patient Gender: M Patient Age:   18 years Exam Location:  [facility] Procedure:      VAS US  CAROTID Referring Phys: WILBERT TURNER --------------------------------------------------------------------------------  Indications:       Carotid artery stenosis. Patient denies cerebrovascular                    symptoms but does complain that his fingers on the left hand                    will sometimes feel cold and numb. Risk Factors:      Hypertension, Diabetes, no history of smoking. Comparison Study:  Previous carotid duplex performed 12/27/20 showed RICA                    velocities of 47/21 cm/sec and LICA velocities of 150/45                    cm/sec. Performing Technologist: Edsel Mustard RVT  Examination Guidelines: A complete evaluation includes B-mode imaging, spectral Doppler, color Doppler, and  power  Doppler as needed of all accessible portions of each vessel. Bilateral testing is considered an integral part of a complete examination. Limited examinations for reoccurring indications may be performed as noted.  Right Carotid Findings: +----------+--------+--------+--------+------------------+--------+           PSV cm/sEDV cm/sStenosisPlaque DescriptionComments +----------+--------+--------+--------+------------------+--------+ CCA Prox  52      11                                         +----------+--------+--------+--------+------------------+--------+ CCA Distal62      16                                         +----------+--------+--------+--------+------------------+--------+ ICA Prox  63      21              heterogenous               +----------+--------+--------+--------+------------------+--------+ ICA Mid   67      21      1-39%                              +----------+--------+--------+--------+------------------+--------+ ICA Distal71      32                                         +----------+--------+--------+--------+------------------+--------+ ECA       54      14                                         +----------+--------+--------+--------+------------------+--------+ +----------+--------+-------+----------------+-------------------+           PSV cm/sEDV cmsDescribe        Arm Pressure (mmHG) +----------+--------+-------+----------------+-------------------+ [id number]             Multiphasic, [id number]                 +----------+--------+-------+----------------+-------------------+ +---------+--------+-+--------+-+------+ VertebralPSV cm/s0EDV cm/s0Absent +---------+--------+-+--------+-+------+  Left Carotid Findings: +----------+--------+--------+--------+-------------------------+--------+           PSV cm/sEDV cm/sStenosisPlaque Description       Comments  +----------+--------+--------+--------+-------------------------+--------+ CCA Prox  66      17                                                +----------+--------+--------+--------+-------------------------+--------+ CCA Distal55      16                                                +----------+--------+--------+--------+-------------------------+--------+ ICA Prox  133     49              heterogenous                      +----------+--------+--------+--------+-------------------------+--------+ ICA Mid   166     55  40-59%  irregular and hyperechoic         +----------+--------+--------+--------+-------------------------+--------+ ICA Distal43      15                                                +----------+--------+--------+--------+-------------------------+--------+ ECA       47      12                                                +----------+--------+--------+--------+-------------------------+--------+ +----------+--------+--------+----------------+-------------------+           PSV cm/sEDV cm/sDescribe        Arm Pressure (mmHG) +----------+--------+--------+----------------+-------------------+ Subclavian80      0       Multiphasic, TWO875                 +----------+--------+--------+----------------+-------------------+ +---------+--------+--+--------+--+---------+ VertebralPSV cm/s42EDV cm/s17Antegrade +---------+--------+--+--------+--+---------+   Summary: Right Carotid: Velocities in the right ICA are consistent with a 1-39% stenosis. 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 Vertebrals:  Left vertebral artery demonstrates antegrade flow. Right vertebral              artery demonstrates an occlusion. Subclavians: Normal flow hemodynamics were seen in bilateral subclavian              arteries. *See table(s) above for measurements and observations. Suggest follow up study in 12 months.  Electronically signed by Dorn Ross MD on 03/30/2022 at 3:45:36 PM.    Final    MYOCARDIAL PERFUSION IMAGING Result Date: 03/29/2022   The study is normal. Findings are consistent with no prior ischemia and no prior myocardial infarction. The study is low risk.   No ST deviation was noted.   LV perfusion is normal. There is no evidence of ischemia. There is no evidence of infarction.   Left ventricular function is normal. Nuclear stress EF: 64 %. The left ventricular ejection fraction is normal (55-65%). End diastolic cavity size is normal. End systolic cavity size is normal.   Prior study available for comparison from 03/16/2009.   ECHOCARDIOGRAM COMPLETE Result Date: 03/29/2022    ECHOCARDIOGRAM REPORT   Patient Name:   Kevin Weaver Date of Exam: 03/29/2022 Medical Rec #:  [id number]      Height:       70.0 in Accession #:    [id number]     Weight:       218.0 lb Date of Birth:  17-Mar-1941     BSA:          2.165 m Patient Age:    80 years       BP:           107/69 mmHg Patient Gender: M              HR:           57 bpm. Exam Location:  [address] Procedure: 2D Echo, Cardiac Doppler, Color Doppler and Intracardiac            Opacification Agent Indications:    R01.1 Murmur  History:        Patient has no prior history of Echocardiogram examinations.  Signs/Symptoms:Murmur; Risk Factors:Hypertension, Dyslipidemia                 and Diabetes.  Sonographer:    Elsie Bohr RDCS Referring Phys: [phone] TRACI R TURNER  Sonographer Comments: Technically difficult study due to poor echo windows. IMPRESSIONS  1. Left ventricular ejection fraction, by estimation, is 60 to 65%. The left ventricle has normal function. The left ventricle has no regional wall motion abnormalities. 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 Left ventricular diastolic parameters are consistent with Grade I diastolic dysfunction (impaired relaxation).  2. Right ventricular  systolic function is normal. The right ventricular size is mildly enlarged. There is normal pulmonary artery systolic pressure. The estimated right ventricular systolic pressure is 23.8 mmHg.  3. Left atrial size was mildly dilated.  4. The mitral valve is normal in structure. Mild mitral valve regurgitation. No evidence of mitral stenosis.  5. Tricuspid valve regurgitation is moderate.  6. The aortic valve is normal in structure. There is mild calcification of the aortic valve. There is mild thickening of the aortic valve. Aortic valve regurgitation is not visualized. Moderate aortic valve stenosis. Aortic valve area, by VTI measures 1.34 cm. Aortic valve mean gradient measures 18.0 mmHg. Aortic valve Vmax measures 2.92 m/s.  7. Aortic dilatation noted. There is moderate dilatation of the ascending aorta, measuring 45 mm.  8. The inferior vena cava is normal in size with greater than 50% respiratory variability, suggesting right atrial pressure of 3 mmHg. FINDINGS  Left Ventricle: Left ventricular ejection fraction, by estimation, is 60 to 65%. The left ventricle has normal function. The left ventricle has no regional wall motion abnormalities. Definity  contrast agent was given IV to delineate the left ventricular  endocardial borders. The left ventricular internal cavity size was normal in size. 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 Left ventricular diastolic parameters are consistent with Grade I diastolic dysfunction (impaired relaxation). Right Ventricle: The right ventricular size is mildly enlarged. No increase in right ventricular wall thickness. Right ventricular systolic function is normal. There is normal pulmonary artery systolic pressure. The tricuspid regurgitant velocity is 2.28  m/s, and with an assumed right atrial pressure of 3 mmHg, the estimated right ventricular systolic pressure is 23.8 mmHg. Left Atrium: Left atrial size was mildly dilated.  Right Atrium: Right atrial size was normal in size. Pericardium: There is no evidence of pericardial effusion. Mitral Valve: The mitral valve is normal in structure. Mild mitral valve regurgitation. No evidence of mitral valve stenosis. Tricuspid Valve: The tricuspid valve is normal in structure. Tricuspid valve regurgitation is moderate . No evidence of tricuspid stenosis. Aortic Valve: The aortic valve is normal in structure. There is mild calcification of the aortic valve. There is mild thickening of the aortic valve. Aortic valve regurgitation is not visualized. Moderate aortic stenosis is present. Aortic valve mean gradient measures 18.0 mmHg. Aortic valve peak gradient measures 34.2 mmHg. Aortic valve area, by VTI measures 1.34 cm. Pulmonic Valve: The pulmonic valve was normal in structure. Pulmonic valve regurgitation is not visualized. No evidence of pulmonic stenosis. Aorta: The aortic root is normal in size and structure and aortic dilatation noted. There is moderate dilatation of the ascending aorta, measuring 45 mm. Venous: The inferior vena cava is normal in size with greater than 50% respiratory variability, suggesting right atrial pressure of 3 mmHg. IAS/Shunts: No atrial level shunt detected by color flow Doppler.  LEFT VENTRICLE PLAX 2D LVIDd:  4.90 cm   Diastology LVIDs:         2.90 cm   LV e' medial:    5.44 cm/s LV PW:         1.10 cm   LV E/e' medial:  17.3 LV IVS:        1.60 cm   LV e' lateral:   9.79 cm/s LVOT diam:     2.30 cm   LV E/e' lateral: 9.6 LV SV:         93 LV SV Index:   43 LVOT Area:     4.15 cm  RIGHT VENTRICLE RVSP:           23.8 mmHg LEFT ATRIUM             Index        RIGHT ATRIUM           Index LA diam:        5.50 cm 2.54 cm/m   RA Pressure: 3.00 mmHg LA Vol (A2C):   59.7 ml 27.57 ml/m  RA Area:     14.60 cm LA Vol (A4C):   83.7 ml 38.66 ml/m  RA Volume:   28.80 ml  13.30 ml/m LA Biplane Vol: 73.0 ml 33.72 ml/m  AORTIC VALVE AV Area (Vmax):    1.43 cm AV  Area (Vmean):   1.45 cm AV Area (VTI):     1.34 cm AV Vmax:           292.50 cm/s AV Vmean:          196.500 cm/s AV VTI:            0.692 m AV Peak Grad:      34.2 mmHg AV Mean Grad:      18.0 mmHg LVOT Vmax:         101.00 cm/s LVOT Vmean:        68.400 cm/s LVOT VTI:          0.224 m LVOT/AV VTI ratio: 0.32  AORTA Ao Root diam: 4.10 cm Ao Asc diam:  4.50 cm MITRAL VALVE                TRICUSPID VALVE MV Area (PHT): 3.40 cm     TR Peak grad:   20.8 mmHg MV Decel Time: 223 msec     TR Vmax:        228.00 cm/s MV E velocity: 94.30 cm/s   Estimated RAP:  3.00 mmHg MV A velocity: 103.00 cm/s  RVSP:           23.8 mmHg MV E/A ratio:  0.92                             SHUNTS                             Systemic VTI:  0.22 m                             Systemic Diam: 2.30 cm Oneil Parchment MD Electronically signed by Oneil Parchment MD Signature Date/Time: 03/29/2022/1:34:26 PM    Final     Assessment & Plan:   Problem List Items Addressed This Visit     HTN (hypertension)    On HCTZ Norvasc  10 mg/d      DM (diabetes mellitus) (  HCC) - Primary    Cont w/ Metformin , glipizide  and Actos . On Lipitor now Loose a few lbs       BMI 35.0-35.9,adult   Wt Readings from Last 3 Encounters:  11/10/23 221 lb (100.2 kg)  08/07/23 214 lb (97.1 kg)  05/07/23 216 lb (98 kg)         Arthritis of foot   Chronic pain Cont on Tramadol  prn  Potential benefits of a long term opioids use as well as potential risks (i.e. addiction risk, apnea etc) and complications (i.e. Somnolence, constipation and others) were explained to the patient and were aknowledged. Using a cane      Chronic shoulder pain   R>>L Taking Mag 400 mg a day Doing well on current meds         No orders of the defined types were placed in this encounter.     Follow-up: No follow-ups on file.  Marolyn Noel, MD

## 2023-11-10 NOTE — Assessment & Plan Note (Signed)
 Wt Readings from Last 3 Encounters:  11/10/23 221 lb (100.2 kg)  08/07/23 214 lb (97.1 kg)  05/07/23 216 lb (98 kg)

## 2023-11-10 NOTE — Assessment & Plan Note (Signed)
  Cont w/ Metformin, glipizide and Actos. On Lipitor now Loose a few lbs  

## 2023-12-03 DIAGNOSIS — H524 Presbyopia: Secondary | ICD-10-CM | POA: Diagnosis not present

## 2023-12-03 DIAGNOSIS — H26493 Other secondary cataract, bilateral: Secondary | ICD-10-CM | POA: Diagnosis not present

## 2023-12-03 DIAGNOSIS — Z961 Presence of intraocular lens: Secondary | ICD-10-CM | POA: Diagnosis not present

## 2023-12-03 DIAGNOSIS — E119 Type 2 diabetes mellitus without complications: Secondary | ICD-10-CM | POA: Diagnosis not present

## 2023-12-08 LAB — HM DIABETES EYE EXAM

## 2023-12-09 ENCOUNTER — Telehealth: Payer: Self-pay | Admitting: Internal Medicine

## 2023-12-09 MED ORDER — TRAMADOL HCL 50 MG PO TABS: ORAL_TABLET | ORAL | 2 refills | Status: DC

## 2023-12-09 NOTE — Telephone Encounter (Signed)
Caller & Relationship to patient: Self  Call back number: [phone]   Date of last office visit: 1.13.25  Date of next office visit: 4.15.25  Medication(s) to be refilled:  traMADol Janean Sark) 50 MG tablet   Preferred Pharmacy:   [facility] [phone]  Phone: [phone]  Fax: [phone]

## 2023-12-09 NOTE — Telephone Encounter (Signed)
Done. Thx.

## 2024-02-10 ENCOUNTER — Ambulatory Visit: Payer: Medicare Other | Admitting: Internal Medicine

## 2024-02-18 ENCOUNTER — Ambulatory Visit

## 2024-02-18 VITALS — BP 131/68 | HR 68 | Ht 64.5 in | Wt 210.4 lb

## 2024-02-18 DIAGNOSIS — Z0000 Encounter for general adult medical examination without abnormal findings: Secondary | ICD-10-CM | POA: Diagnosis not present
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
## 2024-02-18 NOTE — Patient Instructions (Addendum)
 Kevin Weaver , Thank you for taking time to come for your Medicare Wellness Visit. I appreciate your ongoing commitment to your health goals. Please review the following plan we discussed and let me know if I can assist you in the future.   Referrals/Orders/Follow-Ups/Clinician Recommendations: Aim for 30 minutes of exercise or brisk walking, 6-8 glasses of water, and 5 servings of fruits and vegetables each day.   This is a list of the screening recommended for you and due dates:  Health Maintenance  Topic Date Due   Complete foot exam   01/30/2022   Hemoglobin A1C  05/09/2024   Yearly kidney function blood test for diabetes  11/09/2024   Yearly kidney health urinalysis for diabetes  11/09/2024   Eye exam for diabetics  12/07/2024   DTaP/Tdap/Td vaccine (3 - Td or Tdap) 01/03/2025   Medicare Annual Wellness Visit  02/17/2025   HPV Vaccine  Aged Out   Meningitis B Vaccine  Aged Out   Pneumonia Vaccine  Discontinued   Flu Shot  Discontinued   COVID-19 Vaccine  Discontinued   Zoster (Shingles) Vaccine  Discontinued    Advanced directives: (Provided) Advance directive discussed with you today. I have provided a copy for you to complete at home and have notarized. Once this is complete, please bring a copy in to our office so we can scan it into your chart.   Next Medicare Annual Wellness Visit scheduled for next year: Yes  Managing Pain Without Opioids Opioids are strong medicines used to treat moderate to severe pain. For some people, especially those who have long-term (chronic) pain, opioids may not be the best choice for pain management due to: Side effects like nausea, constipation, and sleepiness. The risk of addiction (opioid use disorder). The longer you take opioids, the greater your risk of addiction. Pain that lasts for more than 3 months is called chronic pain. Managing chronic pain usually requires more than one approach and is often provided by a team of health care providers  working together (multidisciplinary approach). Pain management may be done at a pain management center or pain clinic. How to manage pain without the use of opioids Use non-opioid medicines Non-opioid medicines for pain may include: Over-the-counter or prescription non-steroidal anti-inflammatory drugs (NSAIDs). These may be the first medicines used for pain. They work well for muscle and bone pain, and they reduce swelling. Acetaminophen . This over-the-counter medicine may work well for milder pain but not swelling. Antidepressants. These may be used to treat chronic pain. A certain type of antidepressant (tricyclics) is often used. These medicines are given in lower doses for pain than when used for depression. Anticonvulsants. These are usually used to treat seizures but may also reduce nerve (neuropathic) pain. Muscle relaxants. These relieve pain caused by sudden muscle tightening (spasms). You may also use a pain medicine that is applied to the skin as a patch, cream, or gel (topical analgesic), such as a numbing medicine. These may cause fewer side effects than medicines taken by mouth. Do certain therapies as directed Some therapies can help with pain management. They include: Physical therapy. You will do exercises to gain strength and flexibility. A physical therapist may teach you exercises to move and stretch parts of your body that are weak, stiff, or painful. You can learn these exercises at physical therapy visits and practice them at home. Physical therapy may also involve: Massage. Heat wraps or applying heat or cold to affected areas. Electrical signals that interrupt pain signals (transcutaneous  electrical nerve stimulation, TENS). Weak lasers that reduce pain and swelling (low-level laser therapy). Signals from your body that help you learn to regulate pain (biofeedback). Occupational therapy. This helps you to learn ways to function at home and work with less  pain. Recreational therapy. This involves trying new activities or hobbies, such as a physical activity or drawing. Mental health therapy, including: Cognitive behavioral therapy (CBT). This helps you learn coping skills for dealing with pain. Acceptance and commitment therapy (ACT) to change the way you think and react to pain. Relaxation therapies, including muscle relaxation exercises and mindfulness-based stress reduction. Pain management counseling. This may be individual, family, or group counseling.  Receive medical treatments Medical treatments for pain management include: Nerve block injections. These may include a pain blocker and anti-inflammatory medicines. You may have injections: Near the spine to relieve chronic back or neck pain. Into joints to relieve back or joint pain. Into nerve areas that supply a painful area to relieve body pain. Into muscles (trigger point injections) to relieve some painful muscle conditions. A medical device placed near your spine to help block pain signals and relieve nerve pain or chronic back pain (spinal cord stimulation device). Acupuncture. Follow these instructions at home Medicines Take over-the-counter and prescription medicines only as told by your health care provider. If you are taking pain medicine, ask your health care providers about possible side effects to watch out for. Do not drive or use heavy machinery while taking prescription opioid pain medicine. Lifestyle  Do not use drugs or alcohol to reduce pain. If you drink alcohol, limit how much you have to: 0-1 drink a day for women who are not pregnant. 0-2 drinks a day for men. Know how much alcohol is in a drink. In the [place] one drink equals one 12 oz bottle of beer (355 mL), one 5 oz glass of wine (148 mL), or one 1 oz glass of hard liquor (44 mL). Do not use any products that contain nicotine or tobacco. These products include cigarettes, chewing tobacco, and vaping  devices, such as e-cigarettes. If you need help quitting, ask your health care provider. Eat a healthy diet and maintain a healthy weight. Poor diet and excess weight may make pain worse. Eat foods that are high in fiber. These include fresh fruits and vegetables, whole grains, and beans. Limit foods that are high in fat and processed sugars, such as fried and sweet foods. Exercise regularly. Exercise lowers stress and may help relieve pain. Ask your health care provider what activities and exercises are safe for you. If your health care provider approves, join an exercise class that combines movement and stress reduction. Examples include yoga and tai chi. Get enough sleep. Lack of sleep may make pain worse. Lower stress as much as possible. Practice stress reduction techniques as told by your therapist. General instructions Work with all your pain management providers to find the treatments that work best for you. You are an important member of your pain management team. There are many things you can do to reduce pain on your own. Consider joining an online or in-person support group for people who have chronic pain. Keep all follow-up visits. This is important. Where to find more information You can find more information about managing pain without opioids from: American Academy of Pain Medicine: painmed.org Institute for Chronic Pain: instituteforchronicpain.org American Chronic Pain Association: theacpa.org Contact a health care provider if: You have side effects from pain medicine. Your pain gets worse or  does not get better with treatments or home therapy. You are struggling with anxiety or depression. Summary Many types of pain can be managed without opioids. Chronic pain may respond better to pain management without opioids. Pain is best managed when you and a team of health care providers work together. Pain management without opioids may include non-opioid medicines, medical  treatments, physical therapy, mental health therapy, and lifestyle changes. Tell your health care providers if your pain gets worse or is not being managed well enough. This information is not intended to replace advice given to you by your health care provider. Make sure you discuss any questions you have with your health care provider. Document Revised: 01/24/2021 Document Reviewed: 01/24/2021 Elsevier Patient Education  2024 [employer].

## 2024-02-19 ENCOUNTER — Ambulatory Visit (INDEPENDENT_AMBULATORY_CARE_PROVIDER_SITE_OTHER): Admitting: Internal Medicine

## 2024-02-19 ENCOUNTER — Encounter: Payer: Self-pay | Admitting: Internal Medicine

## 2024-02-19 VITALS — BP 114/56 | HR 68 | Temp 97.6°F | Ht 64.5 in | Wt 210.0 lb

## 2024-02-19 DIAGNOSIS — Z7984 Long term (current) use of oral hypoglycemic drugs: Secondary | ICD-10-CM

## 2024-02-19 DIAGNOSIS — I10 Essential (primary) hypertension: Secondary | ICD-10-CM | POA: Diagnosis not present

## 2024-02-19 DIAGNOSIS — E1159 Type 2 diabetes mellitus with other circulatory complications: Secondary | ICD-10-CM

## 2024-02-19 DIAGNOSIS — I6523 Occlusion and stenosis of bilateral carotid arteries: Secondary | ICD-10-CM | POA: Diagnosis not present

## 2024-02-19 DIAGNOSIS — E782 Mixed hyperlipidemia: Secondary | ICD-10-CM | POA: Diagnosis not present

## 2024-02-19 NOTE — Assessment & Plan Note (Signed)
  On HCTZ Norvasc 10 mg/d

## 2024-02-19 NOTE — Progress Notes (Addendum)
 Subjective:  Patient ID: Kevin Weaver, male    DOB: 1941/05/05  Age: 83 y.o. MRN: [id number]  CC: Medical Management of Chronic Issues (3 Month Follow Up)   HPI Kevin Weaver presents for DM, HTN, GERD  Outpatient Medications Prior to Visit  Medication Sig Dispense Refill   amLODipine  (NORVASC ) 10 MG tablet TAKE 1 TABLET BY MOUTH EVERY DAY 90 tablet 3   atorvastatin  (LIPITOR) 40 MG tablet TAKE 1 TABLET BY MOUTH EVERY DAY 90 tablet 3   Cyanocobalamin (B-12) 5000 MCG CAPS Take by mouth daily with breakfast.     glipiZIDE  (GLUCOTROL  XL) 10 MG 24 hr tablet TAKE 1 TABLET BY MOUTH TWICE A DAY 180 tablet 3   hydrochlorothiazide  (HYDRODIURIL ) 25 MG tablet TAKE 1 TABLET (25 MG TOTAL) BY MOUTH DAILY. 90 tablet 3   KLOR-CON  M20 20 MEQ tablet TAKE 1 TABLET BY MOUTH EVERY DAY 90 tablet 3   metFORMIN  (GLUCOPHAGE ) 1000 MG tablet TAKE 1 TABLET BY MOUTH TWICE A DAY WITH FOOD 180 tablet 3   omeprazole  (PRILOSEC) 20 MG capsule TAKE 1 CAPSULE BY MOUTH EVERY DAY 90 capsule 3   pioglitazone  (ACTOS ) 45 MG tablet TAKE 1 TABLET BY MOUTH EVERY DAY 90 tablet 3   traMADol  (ULTRAM ) 50 MG tablet TAKE 1 TABLET BY MOUTH EVERY 8 HOURS AS NEEDED FOR SEVERE PAIN. 90 tablet 2   No facility-administered medications prior to visit.    ROS: Review of Systems  Constitutional:  Negative for appetite change, fatigue and unexpected weight change.  HENT:  Negative for congestion, nosebleeds, sneezing, sore throat and trouble swallowing.   Eyes:  Negative for itching and visual disturbance.  Respiratory:  Negative for cough.   Cardiovascular:  Negative for chest pain, palpitations and leg swelling.  Gastrointestinal:  Negative for abdominal distention, blood in stool, diarrhea and nausea.  Genitourinary:  Negative for frequency and hematuria.  Musculoskeletal:  Positive for gait problem. Negative for back pain, joint swelling and neck pain.  Skin:  Negative for rash.  Neurological:  Negative for dizziness, tremors,  speech difficulty and weakness.  Psychiatric/Behavioral:  Negative for agitation, dysphoric mood, sleep disturbance and suicidal ideas. The patient is not nervous/anxious.     Objective:  BP (!) 114/56   Pulse 68   Temp 97.6 F (36.4 C)   Ht 5' 4.5" (1.638 m)   Wt 210 lb (95.3 kg)   SpO2 95%   BMI 35.49 kg/m   BP Readings from Last 3 Encounters:  02/19/24 (!) 114/56  02/18/24 131/68  08/07/23 122/80    Wt Readings from Last 3 Encounters:  02/19/24 210 lb (95.3 kg)  02/18/24 210 lb 6.4 oz (95.4 kg)  11/10/23 221 lb (100.2 kg)    Physical Exam Constitutional:      General: He is not in acute distress.    Appearance: He is well-developed. He is obese.     Comments: NAD  Eyes:     Conjunctiva/sclera: Conjunctivae normal.     Pupils: Pupils are equal, round, and reactive to light.  Neck:     Thyroid : No thyromegaly.     Vascular: No JVD.  Cardiovascular:     Rate and Rhythm: Normal rate and regular rhythm.     Heart sounds: Normal heart sounds. No murmur heard.    No friction rub. No gallop.  Pulmonary:     Effort: Pulmonary effort is normal. No respiratory distress.     Breath sounds: Normal breath sounds. No wheezing or rales.  Chest:     Chest wall: No tenderness.  Abdominal:     General: Bowel sounds are normal. There is no distension.     Palpations: Abdomen is soft. There is no mass.     Tenderness: There is no abdominal tenderness. There is no guarding or rebound.  Musculoskeletal:        General: Tenderness present. Normal range of motion.     Cervical back: Normal range of motion.  Lymphadenopathy:     Cervical: No cervical adenopathy.  Skin:    General: Skin is warm and dry.     Findings: No rash.  Neurological:     Mental Status: He is alert and oriented to person, place, and time.     Cranial Nerves: No cranial nerve deficit.     Motor: No abnormal muscle tone.     Coordination: Coordination normal.     Gait: Gait abnormal.     Deep Tendon  Reflexes: Reflexes are normal and symmetric.  Psychiatric:        Behavior: Behavior normal.        Thought Content: Thought content normal.        Judgment: Judgment normal.    Using a cane  Lab Results  Component Value Date   WBC 5.3 11/10/2023   HGB 11.2 (L) 11/10/2023   HCT 35.3 (L) 11/10/2023   PLT 281.0 11/10/2023   GLUCOSE 59 (L) 11/10/2023   CHOL 108 11/10/2023   TRIG 63.0 11/10/2023   HDL 51.50 11/10/2023   LDLCALC 44 11/10/2023   ALT 17 11/10/2023   AST 22 11/10/2023   NA 137 11/10/2023   K 4.3 11/10/2023   CL 100 11/10/2023   CREATININE 0.92 11/10/2023   BUN 18 11/10/2023   CO2 26 11/10/2023   TSH 2.94 11/10/2023   PSA 0.35 11/10/2023   HGBA1C 7.8 (H) 11/10/2023   MICROALBUR 0.8 11/10/2023    VAS US  CAROTID Result Date: 03/30/2022 Carotid Arterial Duplex Study Patient Name:  Kevin Weaver  Date of Exam:   03/28/2022 Medical Rec #: [id number]       Accession #:    [id number] Date of Birth: 25-Sep-1941      Patient Gender: M Patient Age:   28 years Exam Location:  [facility] Procedure:      VAS US  CAROTID Referring Phys: Sophia Dustman TURNER --------------------------------------------------------------------------------  Indications:       Carotid artery stenosis. Patient denies cerebrovascular                    symptoms but does complain that his fingers on the left hand                    will sometimes feel cold and numb. Risk Factors:      Hypertension, Diabetes, no history of smoking. Comparison Study:  Previous carotid duplex performed 12/27/20 showed RICA                    velocities of 47/21 cm/sec and LICA velocities of 150/45                    cm/sec. Performing Technologist: Harless Lien RVT  Examination Guidelines: A complete evaluation includes B-mode imaging, spectral Doppler, color Doppler, and power Doppler as needed of all accessible portions of each vessel. Bilateral testing is considered an integral part of a complete examination. Limited examinations for  reoccurring indications may be performed as noted.  Right Carotid Findings: +----------+--------+--------+--------+------------------+--------+  PSV cm/sEDV cm/sStenosisPlaque DescriptionComments +----------+--------+--------+--------+------------------+--------+ CCA Prox  52      11                                         +----------+--------+--------+--------+------------------+--------+ CCA Distal62      16                                         +----------+--------+--------+--------+------------------+--------+ ICA Prox  63      21              heterogenous               +----------+--------+--------+--------+------------------+--------+ ICA Mid   67      21      1-39%                              +----------+--------+--------+--------+------------------+--------+ ICA Distal71      32                                         +----------+--------+--------+--------+------------------+--------+ ECA       54      14                                         +----------+--------+--------+--------+------------------+--------+ +----------+--------+-------+----------------+-------------------+           PSV cm/sEDV cmsDescribe        Arm Pressure (mmHG) +----------+--------+-------+----------------+-------------------+ [id number]             Multiphasic, [id number]                 +----------+--------+-------+----------------+-------------------+ +---------+--------+-+--------+-+------+ VertebralPSV cm/s0EDV cm/s0Absent +---------+--------+-+--------+-+------+  Left Carotid Findings: +----------+--------+--------+--------+-------------------------+--------+           PSV cm/sEDV cm/sStenosisPlaque Description       Comments +----------+--------+--------+--------+-------------------------+--------+ CCA Prox  66      17                                                 +----------+--------+--------+--------+-------------------------+--------+ CCA Distal55      16                                                +----------+--------+--------+--------+-------------------------+--------+ ICA Prox  133     49              heterogenous                      +----------+--------+--------+--------+-------------------------+--------+ ICA Mid   166     55      40-59%  irregular and hyperechoic         +----------+--------+--------+--------+-------------------------+--------+ ICA Distal43      15                                                +----------+--------+--------+--------+-------------------------+--------+  ECA       47      12                                                +----------+--------+--------+--------+-------------------------+--------+ +----------+--------+--------+----------------+-------------------+           PSV cm/sEDV cm/sDescribe        Arm Pressure (mmHG) +----------+--------+--------+----------------+-------------------+ Subclavian80      0       Multiphasic, [id number]                 +----------+--------+--------+----------------+-------------------+ +---------+--------+--+--------+--+---------+ VertebralPSV cm/s42EDV cm/s17Antegrade +---------+--------+--+--------+--+---------+   Summary: Right Carotid: Velocities in the right ICA are consistent with a 1-39% stenosis. 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 Vertebrals:  Left vertebral artery demonstrates antegrade flow. Right vertebral              artery demonstrates an occlusion. Subclavians: Normal flow hemodynamics were seen in bilateral subclavian              arteries. *See table(s) above for measurements and observations. Suggest follow up study in 12 months. Electronically signed by Armida Lander MD on 03/30/2022 at 3:45:36 PM.    Final    MYOCARDIAL PERFUSION IMAGING Result Date: 03/29/2022   The study is  normal. Findings are consistent with no prior ischemia and no prior myocardial infarction. The study is low risk.   No ST deviation was noted.   LV perfusion is normal. There is no evidence of ischemia. There is no evidence of infarction.   Left ventricular function is normal. Nuclear stress EF: 64 %. The left ventricular ejection fraction is normal (55-65%). End diastolic cavity size is normal. End systolic cavity size is normal.   Prior study available for comparison from 03/16/2009.   ECHOCARDIOGRAM COMPLETE Result Date: 03/29/2022    ECHOCARDIOGRAM REPORT   Patient Name:   Kevin Weaver Date of Exam: 03/29/2022 Medical Rec #:  [id number]      Height:       70.0 in Accession #:    [id number]     Weight:       218.0 lb Date of Birth:  05/22/1941     BSA:          2.165 m Patient Age:    80 years       BP:           107/69 mmHg Patient Gender: M              HR:           57 bpm. Exam Location:  [address] Procedure: 2D Echo, Cardiac Doppler, Color Doppler and Intracardiac            Opacification Agent Indications:    R01.1 Murmur  History:        Patient has no prior history of Echocardiogram examinations.                 Signs/Symptoms:Murmur; Risk Factors:Hypertension, Dyslipidemia                 and Diabetes.  Sonographer:    Lula Sale RDCS Referring Phys: [phone] TRACI R TURNER  Sonographer Comments: Technically difficult study due to poor echo windows. IMPRESSIONS  1. Left ventricular ejection fraction, by estimation, is 60 to 65%. The left ventricle has  normal function. The left ventricle has no regional wall motion abnormalities. 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 Left ventricular diastolic parameters are consistent with Grade I diastolic dysfunction (impaired relaxation).  2. Right ventricular systolic function is normal. The right ventricular size is mildly enlarged. There is normal pulmonary artery systolic pressure. The estimated right  ventricular systolic pressure is 23.8 mmHg.  3. Left atrial size was mildly dilated.  4. The mitral valve is normal in structure. Mild mitral valve regurgitation. No evidence of mitral stenosis.  5. Tricuspid valve regurgitation is moderate.  6. The aortic valve is normal in structure. There is mild calcification of the aortic valve. There is mild thickening of the aortic valve. Aortic valve regurgitation is not visualized. Moderate aortic valve stenosis. Aortic valve area, by VTI measures 1.34 cm. Aortic valve mean gradient measures 18.0 mmHg. Aortic valve Vmax measures 2.92 m/s.  7. Aortic dilatation noted. There is moderate dilatation of the ascending aorta, measuring 45 mm.  8. The inferior vena cava is normal in size with greater than 50% respiratory variability, suggesting right atrial pressure of 3 mmHg. FINDINGS  Left Ventricle: Left ventricular ejection fraction, by estimation, is 60 to 65%. The left ventricle has normal function. The left ventricle has no regional wall motion abnormalities. Definity  contrast agent was given IV to delineate the left ventricular  endocardial borders. The left ventricular internal cavity size was normal in size. 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 Left ventricular diastolic parameters are consistent with Grade I diastolic dysfunction (impaired relaxation). Right Ventricle: The right ventricular size is mildly enlarged. No increase in right ventricular wall thickness. Right ventricular systolic function is normal. There is normal pulmonary artery systolic pressure. The tricuspid regurgitant velocity is 2.28  m/s, and with an assumed right atrial pressure of 3 mmHg, the estimated right ventricular systolic pressure is 23.8 mmHg. Left Atrium: Left atrial size was mildly dilated. Right Atrium: Right atrial size was normal in size. Pericardium: There is no evidence of pericardial effusion. Mitral Valve: The mitral valve is normal  in structure. Mild mitral valve regurgitation. No evidence of mitral valve stenosis. Tricuspid Valve: The tricuspid valve is normal in structure. Tricuspid valve regurgitation is moderate . No evidence of tricuspid stenosis. Aortic Valve: The aortic valve is normal in structure. There is mild calcification of the aortic valve. There is mild thickening of the aortic valve. Aortic valve regurgitation is not visualized. Moderate aortic stenosis is present. Aortic valve mean gradient measures 18.0 mmHg. Aortic valve peak gradient measures 34.2 mmHg. Aortic valve area, by VTI measures 1.34 cm. Pulmonic Valve: The pulmonic valve was normal in structure. Pulmonic valve regurgitation is not visualized. No evidence of pulmonic stenosis. Aorta: The aortic root is normal in size and structure and aortic dilatation noted. There is moderate dilatation of the ascending aorta, measuring 45 mm. Venous: The inferior vena cava is normal in size with greater than 50% respiratory variability, suggesting right atrial pressure of 3 mmHg. IAS/Shunts: No atrial level shunt detected by color flow Doppler.  LEFT VENTRICLE PLAX 2D LVIDd:         4.90 cm   Diastology LVIDs:         2.90 cm   LV e' medial:    5.44 cm/s LV PW:         1.10 cm   LV E/e' medial:  17.3 LV IVS:        1.60 cm  LV e' lateral:   9.79 cm/s LVOT diam:     2.30 cm   LV E/e' lateral: 9.6 LV SV:         93 LV SV Index:   43 LVOT Area:     4.15 cm  RIGHT VENTRICLE RVSP:           23.8 mmHg LEFT ATRIUM             Index        RIGHT ATRIUM           Index LA diam:        5.50 cm 2.54 cm/m   RA Pressure: 3.00 mmHg LA Vol (A2C):   59.7 ml 27.57 ml/m  RA Area:     14.60 cm LA Vol (A4C):   83.7 ml 38.66 ml/m  RA Volume:   28.80 ml  13.30 ml/m LA Biplane Vol: 73.0 ml 33.72 ml/m  AORTIC VALVE AV Area (Vmax):    1.43 cm AV Area (Vmean):   1.45 cm AV Area (VTI):     1.34 cm AV Vmax:           292.50 cm/s AV Vmean:          196.500 cm/s AV VTI:            0.692 m AV Peak  Grad:      34.2 mmHg AV Mean Grad:      18.0 mmHg LVOT Vmax:         101.00 cm/s LVOT Vmean:        68.400 cm/s LVOT VTI:          0.224 m LVOT/AV VTI ratio: 0.32  AORTA Ao Root diam: 4.10 cm Ao Asc diam:  4.50 cm MITRAL VALVE                TRICUSPID VALVE MV Area (PHT): 3.40 cm     TR Peak grad:   20.8 mmHg MV Decel Time: 223 msec     TR Vmax:        228.00 cm/s MV E velocity: 94.30 cm/s   Estimated RAP:  3.00 mmHg MV A velocity: 103.00 cm/s  RVSP:           23.8 mmHg MV E/A ratio:  0.92                             SHUNTS                             Systemic VTI:  0.22 m                             Systemic Diam: 2.30 cm Dorothye Gathers MD Electronically signed by Dorothye Gathers MD Signature Date/Time: 03/29/2022/1:34:26 PM    Final     Assessment & Plan:   Problem List Items Addressed This Visit     HTN (hypertension)    On HCTZ Norvasc  10 mg/d      DM (diabetes mellitus) (HCC) - Primary    Cont w/ Metformin , glipizide  and Actos . On Lipitor now Loose a few lbs       Relevant Orders   Comprehensive metabolic panel with GFR   Hemoglobin A1c   Hyperlipidemia   On Lipitor and krill oil      Carotid stenosis, bilateral  On Lipitor and krill oil         No orders of the defined types were placed in this encounter.     Follow-up: Return in about 3 months (around 05/20/2024) for a follow-up visit.  Anitra Barn, MD

## 2024-02-19 NOTE — Assessment & Plan Note (Signed)
  Cont w/ Metformin, glipizide and Actos. On Lipitor now Loose a few lbs  

## 2024-02-19 NOTE — Assessment & Plan Note (Signed)
On Lipitor and krill oil 

## 2024-03-29 ENCOUNTER — Telehealth: Payer: Self-pay | Admitting: Internal Medicine

## 2024-03-29 NOTE — Telephone Encounter (Signed)
 Caller & Relationship to patient: Self  Call back number: [phone]   Date of last office visit: 4.24.25  Date of next office visit: 7.24.25  Medication(s) to be refilled:  traMADol  (ULTRAM ) 50 MG tablet    Preferred Pharmacy:  [facility] [phone]   Phone: [phone]   Fax: [phone]

## 2024-03-30 MED ORDER — TRAMADOL HCL 50 MG PO TABS: ORAL_TABLET | ORAL | 2 refills | Status: DC

## 2024-03-30 NOTE — Telephone Encounter (Signed)
 Okay.  Thanks.

## 2024-05-20 ENCOUNTER — Ambulatory Visit: Admitting: Internal Medicine

## 2024-05-20 ENCOUNTER — Encounter: Payer: Self-pay | Admitting: Internal Medicine

## 2024-05-20 VITALS — BP 124/80 | HR 75 | Temp 97.6°F | Ht 64.5 in | Wt 208.0 lb

## 2024-05-20 DIAGNOSIS — E1159 Type 2 diabetes mellitus with other circulatory complications: Secondary | ICD-10-CM

## 2024-05-20 DIAGNOSIS — I10 Essential (primary) hypertension: Secondary | ICD-10-CM

## 2024-05-20 DIAGNOSIS — R296 Repeated falls: Secondary | ICD-10-CM | POA: Diagnosis not present

## 2024-05-20 DIAGNOSIS — M1991 Primary osteoarthritis, unspecified site: Secondary | ICD-10-CM | POA: Diagnosis not present

## 2024-05-20 LAB — COMPREHENSIVE METABOLIC PANEL WITH GFR
ALT: 19 U/L (ref 0–53)
AST: 29 U/L (ref 0–37)
Albumin: 4.1 g/dL (ref 3.5–5.2)
Alkaline Phosphatase: 71 U/L (ref 39–117)
BUN: 21 mg/dL (ref 6–23)
CO2: 29 meq/L (ref 19–32)
Calcium: 9.5 mg/dL (ref 8.4–10.5)
Chloride: 100 meq/L (ref 96–112)
Creatinine, Ser: 0.92 mg/dL (ref 0.40–1.50)
GFR: 77.32 mL/min (ref >60.00)
Glucose, Bld: 61 mg/dL — ABNORMAL LOW (ref 70–99)
Potassium: 4.4 meq/L (ref 3.5–5.1)
Sodium: 137 meq/L (ref 135–145)
Total Bilirubin: 0.8 mg/dL (ref 0.2–1.2)
Total Protein: 7.3 g/dL (ref 6.0–8.3)

## 2024-05-20 LAB — HEMOGLOBIN A1C: Hgb A1c MFr Bld: 7.3 % — ABNORMAL HIGH (ref 4.6–6.5)

## 2024-05-20 MED ORDER — PIOGLITAZONE HCL 45 MG PO TABS: 45.0000 mg | ORAL_TABLET | Freq: Every day | ORAL | 3 refills | Status: DC

## 2024-05-20 MED ORDER — ONETOUCH DELICA SAFETY LANCING MISC: 3 refills | Status: AC

## 2024-05-20 MED ORDER — GLUCOSE BLOOD VI STRP: ORAL_STRIP | 12 refills | Status: AC

## 2024-05-20 MED ORDER — HYDROCHLOROTHIAZIDE 25 MG PO TABS: 25.0000 mg | ORAL_TABLET | Freq: Every day | ORAL | 3 refills | Status: AC

## 2024-05-20 MED ORDER — ATORVASTATIN CALCIUM 40 MG PO TABS: 40.0000 mg | ORAL_TABLET | Freq: Every day | ORAL | 3 refills | Status: DC

## 2024-05-20 MED ORDER — ONETOUCH ULTRA 2 W/DEVICE KIT: PACK | 1 refills | Status: AC

## 2024-05-20 MED ORDER — AMLODIPINE BESYLATE 10 MG PO TABS: 10.0000 mg | ORAL_TABLET | Freq: Every day | ORAL | 3 refills | Status: DC

## 2024-05-20 MED ORDER — REPAGLINIDE 2 MG PO TABS: 2.0000 mg | ORAL_TABLET | Freq: Three times a day (TID) | ORAL | 11 refills | Status: AC

## 2024-05-20 NOTE — Progress Notes (Signed)
 Subjective:  Patient ID: Kevin Weaver, male    DOB: 07-14-1941  Age: 83 y.o. MRN: [id number]  CC: Follow-up (Pt had a fall due to his sugars falling )   HPI Kevin Weaver presents for DM, HTN, OA, dyslipidemia Pt fell twice: he thinks his sugar was down - not checking CBGs   Outpatient Medications Prior to Visit  Medication Sig Dispense Refill   Cyanocobalamin (B-12) 5000 MCG CAPS Take by mouth daily with breakfast.     KLOR-CON  M20 20 MEQ tablet TAKE 1 TABLET BY MOUTH EVERY DAY 90 tablet 3   metFORMIN  (GLUCOPHAGE ) 1000 MG tablet TAKE 1 TABLET BY MOUTH TWICE A DAY WITH FOOD 180 tablet 3   omeprazole  (PRILOSEC) 20 MG capsule TAKE 1 CAPSULE BY MOUTH EVERY DAY 90 capsule 3   traMADol  (ULTRAM ) 50 MG tablet TAKE 1 TABLET BY MOUTH EVERY 8 HOURS AS NEEDED FOR SEVERE PAIN. 90 tablet 2   amLODipine  (NORVASC ) 10 MG tablet TAKE 1 TABLET BY MOUTH EVERY DAY 90 tablet 3   atorvastatin  (LIPITOR) 40 MG tablet TAKE 1 TABLET BY MOUTH EVERY DAY 90 tablet 3   glipiZIDE  (GLUCOTROL  XL) 10 MG 24 hr tablet TAKE 1 TABLET BY MOUTH TWICE A DAY 180 tablet 3   hydrochlorothiazide  (HYDRODIURIL ) 25 MG tablet TAKE 1 TABLET (25 MG TOTAL) BY MOUTH DAILY. 90 tablet 3   pioglitazone  (ACTOS ) 45 MG tablet TAKE 1 TABLET BY MOUTH EVERY DAY 90 tablet 3   No facility-administered medications prior to visit.    ROS: Review of Systems  Constitutional:  Negative for appetite change, fatigue and unexpected weight change.  HENT:  Negative for congestion, nosebleeds, sneezing, sore throat and trouble swallowing.   Eyes:  Negative for itching and visual disturbance.  Respiratory:  Negative for cough.   Cardiovascular:  Negative for chest pain, palpitations and leg swelling.  Gastrointestinal:  Negative for abdominal distention, blood in stool, diarrhea and nausea.  Genitourinary:  Negative for frequency and hematuria.  Musculoskeletal:  Positive for arthralgias and gait problem. Negative for back pain, joint swelling and  neck pain.  Skin:  Negative for rash.  Neurological:  Negative for dizziness, tremors, speech difficulty and weakness.  Hematological:  Bruises/bleeds easily.  Psychiatric/Behavioral:  Negative for agitation, confusion, dysphoric mood, sleep disturbance and suicidal ideas. The patient is not nervous/anxious.     Objective:  BP 124/80 (BP Location: Left Arm, Patient Position: Sitting, Cuff Size: Normal)   Pulse 75   Temp 97.6 F (36.4 C) (Oral)   Ht 5' 4.5 (1.638 m)   Wt 208 lb (94.3 kg)   SpO2 99%   BMI 35.15 kg/m   BP Readings from Last 3 Encounters:  05/20/24 124/80  02/19/24 (!) 114/56  02/18/24 131/68    Wt Readings from Last 3 Encounters:  05/20/24 208 lb (94.3 kg)  02/19/24 210 lb (95.3 kg)  02/18/24 210 lb 6.4 oz (95.4 kg)    Physical Exam Constitutional:      General: He is not in acute distress.    Appearance: He is well-developed. He is obese.     Comments: NAD  Eyes:     Conjunctiva/sclera: Conjunctivae normal.     Pupils: Pupils are equal, round, and reactive to light.  Neck:     Thyroid : No thyromegaly.     Vascular: No JVD.  Cardiovascular:     Rate and Rhythm: Normal rate and regular rhythm.     Heart sounds: Normal heart sounds. No murmur heard.  No friction rub. No gallop.  Pulmonary:     Effort: Pulmonary effort is normal. No respiratory distress.     Breath sounds: Normal breath sounds. No wheezing or rales.  Chest:     Chest wall: No tenderness.  Abdominal:     General: Bowel sounds are normal. There is no distension.     Palpations: Abdomen is soft. There is no mass.     Tenderness: There is no abdominal tenderness. There is no guarding or rebound.  Musculoskeletal:        General: No tenderness. Normal range of motion.     Cervical back: Normal range of motion.  Lymphadenopathy:     Cervical: No cervical adenopathy.  Skin:    General: Skin is warm and dry.     Findings: No rash.  Neurological:     Mental Status: He is alert and  oriented to person, place, and time.     Cranial Nerves: No cranial nerve deficit.     Motor: No abnormal muscle tone.     Coordination: Coordination normal.     Gait: Gait normal.     Deep Tendon Reflexes: Reflexes are normal and symmetric.  Psychiatric:        Behavior: Behavior normal.        Thought Content: Thought content normal.        Judgment: Judgment normal.   Using a cane, arthritic gait  Lab Results  Component Value Date   WBC 5.3 11/10/2023   HGB 11.2 (L) 11/10/2023   HCT 35.3 (L) 11/10/2023   PLT 281.0 11/10/2023   GLUCOSE 59 (L) 11/10/2023   CHOL 108 11/10/2023   TRIG 63.0 11/10/2023   HDL 51.50 11/10/2023   LDLCALC 44 11/10/2023   ALT 17 11/10/2023   AST 22 11/10/2023   NA 137 11/10/2023   K 4.3 11/10/2023   CL 100 11/10/2023   CREATININE 0.92 11/10/2023   BUN 18 11/10/2023   CO2 26 11/10/2023   TSH 2.94 11/10/2023   PSA 0.35 11/10/2023   HGBA1C 7.8 (H) 11/10/2023   MICROALBUR 0.2 01/04/2015    VAS US  CAROTID Result Date: 03/30/2022 Carotid Arterial Duplex Study Patient Name:  Kevin Weaver  Date of Exam:   03/28/2022 Medical Rec #: [id number]       Accession #:    [id number] Date of Birth: 15-Feb-1941      Patient Gender: M Patient Age:   44 years Exam Location:  [facility] Procedure:      VAS US  CAROTID Referring Phys: WILBERT TURNER --------------------------------------------------------------------------------  Indications:       Carotid artery stenosis. Patient denies cerebrovascular                    symptoms but does complain that his fingers on the left hand                    will sometimes feel cold and numb. Risk Factors:      Hypertension, Diabetes, no history of smoking. Comparison Study:  Previous carotid duplex performed 12/27/20 showed RICA                    velocities of 47/21 cm/sec and LICA velocities of 150/45                    cm/sec. Performing Technologist: Edsel Mustard RVT  Examination Guidelines: A complete evaluation includes B-mode  imaging, spectral Doppler, color Doppler, and power Doppler  as needed of all accessible portions of each vessel. Bilateral testing is considered an integral part of a complete examination. Limited examinations for reoccurring indications may be performed as noted.  Right Carotid Findings: +----------+--------+--------+--------+------------------+--------+           PSV cm/sEDV cm/sStenosisPlaque DescriptionComments +----------+--------+--------+--------+------------------+--------+ CCA Prox  52      11                                         +----------+--------+--------+--------+------------------+--------+ CCA Distal62      16                                         +----------+--------+--------+--------+------------------+--------+ ICA Prox  63      21              heterogenous               +----------+--------+--------+--------+------------------+--------+ ICA Mid   67      21      1-39%                              +----------+--------+--------+--------+------------------+--------+ ICA Distal71      32                                         +----------+--------+--------+--------+------------------+--------+ ECA       54      14                                         +----------+--------+--------+--------+------------------+--------+ +----------+--------+-------+----------------+-------------------+           PSV cm/sEDV cmsDescribe        Arm Pressure (mmHG) +----------+--------+-------+----------------+-------------------+ [id number]             Multiphasic, [id number]                 +----------+--------+-------+----------------+-------------------+ +---------+--------+-+--------+-+------+ VertebralPSV cm/s0EDV cm/s0Absent +---------+--------+-+--------+-+------+  Left Carotid Findings: +----------+--------+--------+--------+-------------------------+--------+           PSV cm/sEDV cm/sStenosisPlaque Description       Comments  +----------+--------+--------+--------+-------------------------+--------+ CCA Prox  66      17                                                +----------+--------+--------+--------+-------------------------+--------+ CCA Distal55      16                                                +----------+--------+--------+--------+-------------------------+--------+ ICA Prox  133     49              heterogenous                      +----------+--------+--------+--------+-------------------------+--------+ ICA Mid   166     55  40-59%  irregular and hyperechoic         +----------+--------+--------+--------+-------------------------+--------+ ICA Distal43      15                                                +----------+--------+--------+--------+-------------------------+--------+ ECA       47      12                                                +----------+--------+--------+--------+-------------------------+--------+ +----------+--------+--------+----------------+-------------------+           PSV cm/sEDV cm/sDescribe        Arm Pressure (mmHG) +----------+--------+--------+----------------+-------------------+ Subclavian80      0       Multiphasic, TWO875                 +----------+--------+--------+----------------+-------------------+ +---------+--------+--+--------+--+---------+ VertebralPSV cm/s42EDV cm/s17Antegrade +---------+--------+--+--------+--+---------+   Summary: Right Carotid: Velocities in the right ICA are consistent with a 1-39% stenosis. 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 Vertebrals:  Left vertebral artery demonstrates antegrade flow. Right vertebral              artery demonstrates an occlusion. Subclavians: Normal flow hemodynamics were seen in bilateral subclavian              arteries. *See table(s) above for measurements and observations. Suggest follow up study in 12 months.  Electronically signed by Dorn Ross MD on 03/30/2022 at 3:45:36 PM.    Final    MYOCARDIAL PERFUSION IMAGING Result Date: 03/29/2022   The study is normal. Findings are consistent with no prior ischemia and no prior myocardial infarction. The study is low risk.   No ST deviation was noted.   LV perfusion is normal. There is no evidence of ischemia. There is no evidence of infarction.   Left ventricular function is normal. Nuclear stress EF: 64 %. The left ventricular ejection fraction is normal (55-65%). End diastolic cavity size is normal. End systolic cavity size is normal.   Prior study available for comparison from 03/16/2009.   ECHOCARDIOGRAM COMPLETE Result Date: 03/29/2022    ECHOCARDIOGRAM REPORT   Patient Name:   Kevin Weaver Date of Exam: 03/29/2022 Medical Rec #:  [id number]      Height:       70.0 in Accession #:    [id number]     Weight:       218.0 lb Date of Birth:  08/30/1941     BSA:          2.165 m Patient Age:    80 years       BP:           107/69 mmHg Patient Gender: M              HR:           57 bpm. Exam Location:  [address] Procedure: 2D Echo, Cardiac Doppler, Color Doppler and Intracardiac            Opacification Agent Indications:    R01.1 Murmur  History:        Patient has no prior history of Echocardiogram examinations.  Signs/Symptoms:Murmur; Risk Factors:Hypertension, Dyslipidemia                 and Diabetes.  Sonographer:    Elsie Bohr RDCS Referring Phys: [phone] TRACI R TURNER  Sonographer Comments: Technically difficult study due to poor echo windows. IMPRESSIONS  1. Left ventricular ejection fraction, by estimation, is 60 to 65%. The left ventricle has normal function. The left ventricle has no regional wall motion abnormalities. 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 Left ventricular diastolic parameters are consistent with Grade I diastolic dysfunction (impaired relaxation).  2. Right ventricular  systolic function is normal. The right ventricular size is mildly enlarged. There is normal pulmonary artery systolic pressure. The estimated right ventricular systolic pressure is 23.8 mmHg.  3. Left atrial size was mildly dilated.  4. The mitral valve is normal in structure. Mild mitral valve regurgitation. No evidence of mitral stenosis.  5. Tricuspid valve regurgitation is moderate.  6. The aortic valve is normal in structure. There is mild calcification of the aortic valve. There is mild thickening of the aortic valve. Aortic valve regurgitation is not visualized. Moderate aortic valve stenosis. Aortic valve area, by VTI measures 1.34 cm. Aortic valve mean gradient measures 18.0 mmHg. Aortic valve Vmax measures 2.92 m/s.  7. Aortic dilatation noted. There is moderate dilatation of the ascending aorta, measuring 45 mm.  8. The inferior vena cava is normal in size with greater than 50% respiratory variability, suggesting right atrial pressure of 3 mmHg. FINDINGS  Left Ventricle: Left ventricular ejection fraction, by estimation, is 60 to 65%. The left ventricle has normal function. The left ventricle has no regional wall motion abnormalities. Definity  contrast agent was given IV to delineate the left ventricular  endocardial borders. The left ventricular internal cavity size was normal in size. 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 Left ventricular diastolic parameters are consistent with Grade I diastolic dysfunction (impaired relaxation). Right Ventricle: The right ventricular size is mildly enlarged. No increase in right ventricular wall thickness. Right ventricular systolic function is normal. There is normal pulmonary artery systolic pressure. The tricuspid regurgitant velocity is 2.28  m/s, and with an assumed right atrial pressure of 3 mmHg, the estimated right ventricular systolic pressure is 23.8 mmHg. Left Atrium: Left atrial size was mildly dilated.  Right Atrium: Right atrial size was normal in size. Pericardium: There is no evidence of pericardial effusion. Mitral Valve: The mitral valve is normal in structure. Mild mitral valve regurgitation. No evidence of mitral valve stenosis. Tricuspid Valve: The tricuspid valve is normal in structure. Tricuspid valve regurgitation is moderate . No evidence of tricuspid stenosis. Aortic Valve: The aortic valve is normal in structure. There is mild calcification of the aortic valve. There is mild thickening of the aortic valve. Aortic valve regurgitation is not visualized. Moderate aortic stenosis is present. Aortic valve mean gradient measures 18.0 mmHg. Aortic valve peak gradient measures 34.2 mmHg. Aortic valve area, by VTI measures 1.34 cm. Pulmonic Valve: The pulmonic valve was normal in structure. Pulmonic valve regurgitation is not visualized. No evidence of pulmonic stenosis. Aorta: The aortic root is normal in size and structure and aortic dilatation noted. There is moderate dilatation of the ascending aorta, measuring 45 mm. Venous: The inferior vena cava is normal in size with greater than 50% respiratory variability, suggesting right atrial pressure of 3 mmHg. IAS/Shunts: No atrial level shunt detected by color flow Doppler.  LEFT VENTRICLE PLAX 2D LVIDd:  4.90 cm   Diastology LVIDs:         2.90 cm   LV e' medial:    5.44 cm/s LV PW:         1.10 cm   LV E/e' medial:  17.3 LV IVS:        1.60 cm   LV e' lateral:   9.79 cm/s LVOT diam:     2.30 cm   LV E/e' lateral: 9.6 LV SV:         93 LV SV Index:   43 LVOT Area:     4.15 cm  RIGHT VENTRICLE RVSP:           23.8 mmHg LEFT ATRIUM             Index        RIGHT ATRIUM           Index LA diam:        5.50 cm 2.54 cm/m   RA Pressure: 3.00 mmHg LA Vol (A2C):   59.7 ml 27.57 ml/m  RA Area:     14.60 cm LA Vol (A4C):   83.7 ml 38.66 ml/m  RA Volume:   28.80 ml  13.30 ml/m LA Biplane Vol: 73.0 ml 33.72 ml/m  AORTIC VALVE AV Area (Vmax):    1.43 cm AV  Area (Vmean):   1.45 cm AV Area (VTI):     1.34 cm AV Vmax:           292.50 cm/s AV Vmean:          196.500 cm/s AV VTI:            0.692 m AV Peak Grad:      34.2 mmHg AV Mean Grad:      18.0 mmHg LVOT Vmax:         101.00 cm/s LVOT Vmean:        68.400 cm/s LVOT VTI:          0.224 m LVOT/AV VTI ratio: 0.32  AORTA Ao Root diam: 4.10 cm Ao Asc diam:  4.50 cm MITRAL VALVE                TRICUSPID VALVE MV Area (PHT): 3.40 cm     TR Peak grad:   20.8 mmHg MV Decel Time: 223 msec     TR Vmax:        228.00 cm/s MV E velocity: 94.30 cm/s   Estimated RAP:  3.00 mmHg MV A velocity: 103.00 cm/s  RVSP:           23.8 mmHg MV E/A ratio:  0.92                             SHUNTS                             Systemic VTI:  0.22 m                             Systemic Diam: 2.30 cm Oneil Parchment MD Electronically signed by Oneil Parchment MD Signature Date/Time: 03/29/2022/1:34:26 PM    Final     Assessment & Plan:   Problem List Items Addressed This Visit     DM (diabetes mellitus) (HCC)   Pt fell twice: he thinks his sugar was down - not checking  CBGs D/c Glipizide  Start Prandin  Check CBGs Rx for glucometer      Relevant Medications   atorvastatin  (LIPITOR) 40 MG tablet   pioglitazone  (ACTOS ) 45 MG tablet   repaglinide  (PRANDIN ) 2 MG tablet   Falls - Primary   Pt fell twice: he thinks his sugar was down - not checking CBGs D/c Glipizide  Start Prandin  Check CBGs      HTN (hypertension)    On HCTZ Norvasc  10 mg/d      Relevant Medications   amLODipine  (NORVASC ) 10 MG tablet   atorvastatin  (LIPITOR) 40 MG tablet   hydrochlorothiazide  (HYDRODIURIL ) 25 MG tablet   Osteoarthritis   On Vit D, krill oil Tramadol  prn  Potential benefits of a long term opioids use as well as potential risks (i.e. addiction risk, apnea etc) and complications (i.e. Somnolence, constipation and others) were explained to the patient and were aknowledged.         Meds ordered this encounter  Medications   amLODipine   (NORVASC ) 10 MG tablet    Sig: Take 1 tablet (10 mg total) by mouth daily.    Dispense:  90 tablet    Refill:  3   atorvastatin  (LIPITOR) 40 MG tablet    Sig: Take 1 tablet (40 mg total) by mouth daily.    Dispense:  90 tablet    Refill:  3   hydrochlorothiazide  (HYDRODIURIL ) 25 MG tablet    Sig: Take 1 tablet (25 mg total) by mouth daily.    Dispense:  90 tablet    Refill:  3   pioglitazone  (ACTOS ) 45 MG tablet    Sig: Take 1 tablet (45 mg total) by mouth daily.    Dispense:  90 tablet    Refill:  3   repaglinide  (PRANDIN ) 2 MG tablet    Sig: Take 1 tablet (2 mg total) by mouth 3 (three) times daily before meals.    Dispense:  90 tablet    Refill:  11   Lancets (ONETOUCH DELICA SAFETY LANCING) MISC    Sig: Use bid    Dispense:  100 each    Refill:  3   glucose blood test strip    Sig: Use as instructed    Dispense:  100 each    Refill:  12   Blood Glucose Monitoring Suppl (ONE TOUCH ULTRA 2) w/Device KIT    Sig: Use bid    Dispense:  1 kit    Refill:  1      Follow-up: Return in about 3 months (around 08/20/2024) for a follow-up visit.  Marolyn Noel, MD

## 2024-05-20 NOTE — Assessment & Plan Note (Signed)
 Pt fell twice: he thinks his sugar was down - not checking CBGs D/c Glipizide  Start Prandin  Check CBGs

## 2024-05-20 NOTE — Assessment & Plan Note (Signed)
  On HCTZ Norvasc 10 mg/d

## 2024-05-20 NOTE — Assessment & Plan Note (Signed)
 Pt fell twice: he thinks his sugar was down - not checking CBGs D/c Glipizide  Start Prandin  Check CBGs Rx for glucometer

## 2024-05-20 NOTE — Patient Instructions (Signed)
  Stop Glipizide  Start Prandin

## 2024-05-20 NOTE — Assessment & Plan Note (Signed)
On Vit D, krill oil Tramadol prn  Potential benefits of a long term opioids use as well as potential risks (i.e. addiction risk, apnea etc) and complications (i.e. Somnolence, constipation and others) were explained to the patient and were aknowledged.

## 2024-05-23 ENCOUNTER — Ambulatory Visit: Payer: Self-pay | Admitting: Internal Medicine

## 2024-07-05 ENCOUNTER — Other Ambulatory Visit: Payer: Self-pay

## 2024-07-05 ENCOUNTER — Telehealth: Payer: Self-pay | Admitting: Internal Medicine

## 2024-07-05 NOTE — Telephone Encounter (Signed)
 Medication was pend and sent to Dr. Garald.

## 2024-07-05 NOTE — Telephone Encounter (Signed)
 Prescription Request  07/05/2024  LOV: 05/20/2024  What is the name of the medication or equipment? Tramadol   Have you contacted your pharmacy to request a refill? Yes   Which pharmacy would you like this sent to?  [id number] [address] [place], [place] - [address] [place] [address] Phone: [phone] Fax: [phone]    Patient notified that their request is being sent to the clinical staff for review and that they should receive a response within 2 business days.   Please advise at Mobile [phone] (mobile)

## 2024-07-09 ENCOUNTER — Other Ambulatory Visit: Payer: Self-pay | Admitting: Internal Medicine

## 2024-07-09 NOTE — Telephone Encounter (Signed)
 PT came into office to find out why medication has not been filled and would like a call back At:[phone]

## 2024-08-18 ENCOUNTER — Ambulatory Visit: Admitting: Internal Medicine

## 2024-08-18 ENCOUNTER — Encounter: Payer: Self-pay | Admitting: Internal Medicine

## 2024-08-18 VITALS — BP 102/62 | HR 67 | Temp 98.7°F | Ht 64.5 in | Wt 218.4 lb

## 2024-08-18 DIAGNOSIS — I7121 Aneurysm of the ascending aorta, without rupture: Secondary | ICD-10-CM | POA: Diagnosis not present

## 2024-08-18 DIAGNOSIS — M19072 Primary osteoarthritis, left ankle and foot: Secondary | ICD-10-CM

## 2024-08-18 DIAGNOSIS — E1151 Type 2 diabetes mellitus with diabetic peripheral angiopathy without gangrene: Secondary | ICD-10-CM | POA: Diagnosis not present

## 2024-08-18 DIAGNOSIS — M19071 Primary osteoarthritis, right ankle and foot: Secondary | ICD-10-CM

## 2024-08-18 DIAGNOSIS — Q6689 Other  specified congenital deformities of feet: Secondary | ICD-10-CM

## 2024-08-18 DIAGNOSIS — I6523 Occlusion and stenosis of bilateral carotid arteries: Secondary | ICD-10-CM | POA: Diagnosis not present

## 2024-08-18 DIAGNOSIS — Z7984 Long term (current) use of oral hypoglycemic drugs: Secondary | ICD-10-CM | POA: Diagnosis not present

## 2024-08-18 LAB — COMPREHENSIVE METABOLIC PANEL WITH GFR
ALT: 22 U/L (ref 0–53)
AST: 25 U/L (ref 0–37)
Albumin: 4 g/dL (ref 3.5–5.2)
Alkaline Phosphatase: 71 U/L (ref 39–117)
BUN: 16 mg/dL (ref 6–23)
CO2: 28 meq/L (ref 19–32)
Calcium: 8.9 mg/dL (ref 8.4–10.5)
Chloride: 103 meq/L (ref 96–112)
Creatinine, Ser: 0.84 mg/dL (ref 0.40–1.50)
GFR: 80.92 mL/min (ref >60.00)
Glucose, Bld: 80 mg/dL (ref 70–99)
Potassium: 4.1 meq/L (ref 3.5–5.1)
Sodium: 140 meq/L (ref 135–145)
Total Bilirubin: 0.6 mg/dL (ref 0.2–1.2)
Total Protein: 7 g/dL (ref 6.0–8.3)

## 2024-08-18 LAB — HEMOGLOBIN A1C: Hgb A1c MFr Bld: 7.7 % — ABNORMAL HIGH (ref 4.6–6.5)

## 2024-08-18 MED ORDER — POTASSIUM CHLORIDE CRYS ER 20 MEQ PO TBCR: 20.0000 meq | EXTENDED_RELEASE_TABLET | Freq: Every day | ORAL | 3 refills | Status: AC

## 2024-08-18 MED ORDER — ATORVASTATIN CALCIUM 40 MG PO TABS: 40.0000 mg | ORAL_TABLET | Freq: Every day | ORAL | 3 refills | Status: AC

## 2024-08-18 MED ORDER — AMLODIPINE BESYLATE 10 MG PO TABS: 10.0000 mg | ORAL_TABLET | Freq: Every day | ORAL | 3 refills | Status: AC

## 2024-08-18 MED ORDER — PIOGLITAZONE HCL 45 MG PO TABS: 45.0000 mg | ORAL_TABLET | Freq: Every day | ORAL | 3 refills | Status: AC

## 2024-08-18 NOTE — Assessment & Plan Note (Addendum)
 No more falls Off Glipizide  On Prandin  - doing well Checking CBGs at home: 70-140 Refused all shots (vaccines)

## 2024-08-18 NOTE — Assessment & Plan Note (Signed)
Using a cane 

## 2024-08-18 NOTE — Progress Notes (Signed)
 Subjective:  Patient ID: Kevin Weaver, male    DOB: 08/12/1941  Age: 83 y.o. MRN: [id number]  CC: Medical Management of Chronic Issues (3 Month follow up)   HPI Kevin Weaver Clause presents for HTN, DM, dyslipidemia, OA  Outpatient Medications Prior to Visit  Medication Sig Dispense Refill   Blood Glucose Monitoring Suppl (ONE TOUCH ULTRA 2) w/Device KIT Use bid 1 kit 1   Cyanocobalamin (B-12) 5000 MCG CAPS Take by mouth daily with breakfast.     glucose blood test strip Use as instructed 100 each 12   hydrochlorothiazide  (HYDRODIURIL ) 25 MG tablet Take 1 tablet (25 mg total) by mouth daily. 90 tablet 3   Lancets (ONETOUCH DELICA SAFETY LANCING) MISC Use bid 100 each 3   metFORMIN  (GLUCOPHAGE ) 1000 MG tablet TAKE 1 TABLET BY MOUTH TWICE A DAY WITH FOOD 180 tablet 3   omeprazole  (PRILOSEC) 20 MG capsule TAKE 1 CAPSULE BY MOUTH EVERY DAY 90 capsule 3   repaglinide  (PRANDIN ) 2 MG tablet Take 1 tablet (2 mg total) by mouth 3 (three) times daily before meals. 90 tablet 11   traMADol  (ULTRAM ) 50 MG tablet TAKE 1 TABLET BY MOUTH EVERY 8 HOURS AS NEEDED FOR SEVERE PAIN. 90 tablet 2   amLODipine  (NORVASC ) 10 MG tablet Take 1 tablet (10 mg total) by mouth daily. 90 tablet 3   atorvastatin  (LIPITOR) 40 MG tablet Take 1 tablet (40 mg total) by mouth daily. 90 tablet 3   KLOR-CON  M20 20 MEQ tablet TAKE 1 TABLET BY MOUTH EVERY DAY 90 tablet 3   pioglitazone  (ACTOS ) 45 MG tablet Take 1 tablet (45 mg total) by mouth daily. 90 tablet 3   No facility-administered medications prior to visit.    ROS: Review of Systems  Constitutional:  Negative for appetite change, fatigue and unexpected weight change.  HENT:  Negative for congestion, nosebleeds, sneezing, sore throat and trouble swallowing.   Eyes:  Negative for itching and visual disturbance.  Respiratory:  Negative for cough.   Cardiovascular:  Negative for chest pain, palpitations and leg swelling.  Gastrointestinal:  Negative for abdominal  distention, blood in stool, diarrhea and nausea.  Genitourinary:  Negative for frequency and hematuria.  Musculoskeletal:  Positive for arthralgias, back pain and gait problem. Negative for joint swelling and neck pain.  Skin:  Negative for rash.  Neurological:  Negative for dizziness, tremors, speech difficulty and weakness.  Psychiatric/Behavioral:  Negative for agitation, dysphoric mood, sleep disturbance and suicidal ideas. The patient is not nervous/anxious.     Objective:  BP 102/62   Pulse 67   Temp 98.7 F (37.1 C)   Ht 5' 4.5 (1.638 m)   Wt 218 lb 6.4 oz (99.1 kg)   SpO2 98%   BMI 36.91 kg/m   BP Readings from Last 3 Encounters:  08/18/24 102/62  05/20/24 124/80  02/19/24 (!) 114/56    Wt Readings from Last 3 Encounters:  08/18/24 218 lb 6.4 oz (99.1 kg)  05/20/24 208 lb (94.3 kg)  02/19/24 210 lb (95.3 kg)    Physical Exam Constitutional:      General: He is not in acute distress.    Appearance: He is well-developed.     Comments: NAD  Eyes:     Conjunctiva/sclera: Conjunctivae normal.     Pupils: Pupils are equal, round, and reactive to light.  Neck:     Thyroid : No thyromegaly.     Vascular: No JVD.  Cardiovascular:     Rate and Rhythm:  Normal rate and regular rhythm.     Heart sounds: Murmur heard.     No friction rub. No gallop.  Pulmonary:     Effort: Pulmonary effort is normal. No respiratory distress.     Breath sounds: Normal breath sounds. No wheezing or rales.  Chest:     Chest wall: No tenderness.  Abdominal:     General: Bowel sounds are normal. There is no distension.     Palpations: Abdomen is soft. There is no mass.     Tenderness: There is no abdominal tenderness. There is no guarding or rebound.  Musculoskeletal:        General: Tenderness present. Normal range of motion.     Cervical back: Normal range of motion.     Right lower leg: No edema.     Left lower leg: No edema.  Lymphadenopathy:     Cervical: No cervical adenopathy.   Skin:    General: Skin is warm and dry.     Findings: No rash.  Neurological:     Mental Status: He is alert and oriented to person, place, and time.     Cranial Nerves: No cranial nerve deficit.     Motor: No abnormal muscle tone.     Coordination: Coordination normal.     Gait: Gait abnormal.     Deep Tendon Reflexes: Reflexes are normal and symmetric.  Psychiatric:        Behavior: Behavior normal.        Thought Content: Thought content normal.        Judgment: Judgment normal.   Arthritic gait Using a cane   Lab Results  Component Value Date   WBC 5.3 11/10/2023   HGB 11.2 (L) 11/10/2023   HCT 35.3 (L) 11/10/2023   PLT 281.0 11/10/2023   GLUCOSE 61 (L) 05/20/2024   CHOL 108 11/10/2023   TRIG 63.0 11/10/2023   HDL 51.50 11/10/2023   LDLCALC 44 11/10/2023   ALT 19 05/20/2024   AST 29 05/20/2024   NA 137 05/20/2024   K 4.4 05/20/2024   CL 100 05/20/2024   CREATININE 0.92 05/20/2024   BUN 21 05/20/2024   CO2 29 05/20/2024   TSH 2.94 11/10/2023   PSA 0.35 11/10/2023   HGBA1C 7.3 (H) 05/20/2024   MICROALBUR 0.2 01/04/2015    VAS US  CAROTID Result Date: 03/30/2022 Carotid Arterial Duplex Study Patient Name:  Kevin Weaver Coalson  Date of Exam:   03/28/2022 Medical Rec #: [id number]       Accession #:    [id number] Date of Birth: 09-14-1941      Patient Gender: M Patient Age:   57 years Exam Location:  [facility] Procedure:      VAS US  CAROTID Referring Phys: WILBERT TURNER --------------------------------------------------------------------------------  Indications:       Carotid artery stenosis. Patient denies cerebrovascular                    symptoms but does complain that his fingers on the left hand                    will sometimes feel cold and numb. Risk Factors:      Hypertension, Diabetes, no history of smoking. Comparison Study:  Previous carotid duplex performed 12/27/20 showed RICA                    velocities of 47/21 cm/sec and LICA velocities of 150/45  cm/sec. Performing Technologist: Edsel Mustard RVT  Examination Guidelines: A complete evaluation includes B-mode imaging, spectral Doppler, color Doppler, and power Doppler as needed of all accessible portions of each vessel. Bilateral testing is considered an integral part of a complete examination. Limited examinations for reoccurring indications may be performed as noted.  Right Carotid Findings: +----------+--------+--------+--------+------------------+--------+           PSV cm/sEDV cm/sStenosisPlaque DescriptionComments +----------+--------+--------+--------+------------------+--------+ CCA Prox  52      11                                         +----------+--------+--------+--------+------------------+--------+ CCA Distal62      16                                         +----------+--------+--------+--------+------------------+--------+ ICA Prox  63      21              heterogenous               +----------+--------+--------+--------+------------------+--------+ ICA Mid   67      21      1-39%                              +----------+--------+--------+--------+------------------+--------+ ICA Distal71      32                                         +----------+--------+--------+--------+------------------+--------+ ECA       54      14                                         +----------+--------+--------+--------+------------------+--------+ +----------+--------+-------+----------------+-------------------+           PSV cm/sEDV cmsDescribe        Arm Pressure (mmHG) +----------+--------+-------+----------------+-------------------+ [id number]             Multiphasic, [id number]                 +----------+--------+-------+----------------+-------------------+ +---------+--------+-+--------+-+------+ VertebralPSV cm/s0EDV cm/s0Absent +---------+--------+-+--------+-+------+  Left Carotid Findings:  +----------+--------+--------+--------+-------------------------+--------+           PSV cm/sEDV cm/sStenosisPlaque Description       Comments +----------+--------+--------+--------+-------------------------+--------+ CCA Prox  66      17                                                +----------+--------+--------+--------+-------------------------+--------+ CCA Distal55      16                                                +----------+--------+--------+--------+-------------------------+--------+ ICA Prox  133     49              heterogenous                      +----------+--------+--------+--------+-------------------------+--------+  ICA Mid   166     55      40-59%  irregular and hyperechoic         +----------+--------+--------+--------+-------------------------+--------+ ICA Distal43      15                                                +----------+--------+--------+--------+-------------------------+--------+ ECA       47      12                                                +----------+--------+--------+--------+-------------------------+--------+ +----------+--------+--------+----------------+-------------------+           PSV cm/sEDV cm/sDescribe        Arm Pressure (mmHG) +----------+--------+--------+----------------+-------------------+ Subclavian80      0       Multiphasic, TWO875                 +----------+--------+--------+----------------+-------------------+ +---------+--------+--+--------+--+---------+ VertebralPSV cm/s42EDV cm/s17Antegrade +---------+--------+--+--------+--+---------+   Summary: Right Carotid: Velocities in the right ICA are consistent with a 1-39% stenosis. 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 Vertebrals:  Left vertebral artery demonstrates antegrade flow. Right vertebral              artery demonstrates an occlusion. Subclavians: Normal flow hemodynamics were seen  in bilateral subclavian              arteries. *See table(s) above for measurements and observations. Suggest follow up study in 12 months. Electronically signed by Dorn Ross MD on 03/30/2022 at 3:45:36 PM.    Final    MYOCARDIAL PERFUSION IMAGING Result Date: 03/29/2022   The study is normal. Findings are consistent with no prior ischemia and no prior myocardial infarction. The study is low risk.   No ST deviation was noted.   LV perfusion is normal. There is no evidence of ischemia. There is no evidence of infarction.   Left ventricular function is normal. Nuclear stress EF: 64 %. The left ventricular ejection fraction is normal (55-65%). End diastolic cavity size is normal. End systolic cavity size is normal.   Prior study available for comparison from 03/16/2009.   ECHOCARDIOGRAM COMPLETE Result Date: 03/29/2022    ECHOCARDIOGRAM REPORT   Patient Name:   Kevin Weaver Date of Exam: 03/29/2022 Medical Rec #:  [id number]      Height:       70.0 in Accession #:    [id number]     Weight:       218.0 lb Date of Birth:  [birth year]     BSA:          2.165 m Patient Age:    80 years       BP:           107/69 mmHg Patient Gender: M              HR:           57 bpm. Exam Location:  [address] Procedure: 2D Echo, Cardiac Doppler, Color Doppler and Intracardiac            Opacification Agent Indications:    R01.1 Murmur  History:        Patient has no  prior history of Echocardiogram examinations.                 Signs/Symptoms:Murmur; Risk Factors:Hypertension, Dyslipidemia                 and Diabetes.  Sonographer:    Elsie Bohr RDCS Referring Phys: [phone] TRACI R TURNER  Sonographer Comments: Technically difficult study due to poor echo windows. IMPRESSIONS  1. Left ventricular ejection fraction, by estimation, is 60 to 65%. The left ventricle has normal function. The left ventricle has no regional wall motion abnormalities. There is moderate asymmetric left ventricular hypertrophy of the basal-septal  segment. Left ventricular diastolic parameters are consistent with Grade I diastolic dysfunction (impaired relaxation).  2. Right ventricular systolic function is normal. The right ventricular size is mildly enlarged. There is normal pulmonary artery systolic pressure. The estimated right ventricular systolic pressure is 23.8 mmHg.  3. Left atrial size was mildly dilated.  4. The mitral valve is normal in structure. Mild mitral valve regurgitation. No evidence of mitral stenosis.  5. Tricuspid valve regurgitation is moderate.  6. The aortic valve is normal in structure. There is mild calcification of the aortic valve. There is mild thickening of the aortic valve. Aortic valve regurgitation is not visualized. Moderate aortic valve stenosis. Aortic valve area, by VTI measures 1.34 cm. Aortic valve mean gradient measures 18.0 mmHg. Aortic valve Vmax measures 2.92 m/s.  7. Aortic dilatation noted. There is moderate dilatation of the ascending aorta, measuring 45 mm.  8. The inferior vena cava is normal in size with greater than 50% respiratory variability, suggesting right atrial pressure of 3 mmHg. FINDINGS  Left Ventricle: Left ventricular ejection fraction, by estimation, is 60 to 65%. The left ventricle has normal function. The left ventricle has no regional wall motion abnormalities. Definity  contrast agent was given IV to delineate the left ventricular  endocardial borders. The left ventricular internal cavity size was normal in size. 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 Left ventricular diastolic parameters are consistent with Grade I diastolic dysfunction (impaired relaxation). Right Ventricle: The right ventricular size is mildly enlarged. No increase in right ventricular wall thickness. Right ventricular systolic function is normal. There is normal pulmonary artery systolic pressure. The tricuspid regurgitant velocity is 2.28  m/s, and with an assumed right  atrial pressure of 3 mmHg, the estimated right ventricular systolic pressure is 23.8 mmHg. Left Atrium: Left atrial size was mildly dilated. Right Atrium: Right atrial size was normal in size. Pericardium: There is no evidence of pericardial effusion. Mitral Valve: The mitral valve is normal in structure. Mild mitral valve regurgitation. No evidence of mitral valve stenosis. Tricuspid Valve: The tricuspid valve is normal in structure. Tricuspid valve regurgitation is moderate . No evidence of tricuspid stenosis. Aortic Valve: The aortic valve is normal in structure. There is mild calcification of the aortic valve. There is mild thickening of the aortic valve. Aortic valve regurgitation is not visualized. Moderate aortic stenosis is present. Aortic valve mean gradient measures 18.0 mmHg. Aortic valve peak gradient measures 34.2 mmHg. Aortic valve area, by VTI measures 1.34 cm. Pulmonic Valve: The pulmonic valve was normal in structure. Pulmonic valve regurgitation is not visualized. No evidence of pulmonic stenosis. Aorta: The aortic root is normal in size and structure and aortic dilatation noted. There is moderate dilatation of the ascending aorta, measuring 45 mm. Venous: The inferior vena cava is normal in size with greater than 50% respiratory variability, suggesting right atrial pressure  of 3 mmHg. IAS/Shunts: No atrial level shunt detected by color flow Doppler.  LEFT VENTRICLE PLAX 2D LVIDd:         4.90 cm   Diastology LVIDs:         2.90 cm   LV e' medial:    5.44 cm/s LV PW:         1.10 cm   LV E/e' medial:  17.3 LV IVS:        1.60 cm   LV e' lateral:   9.79 cm/s LVOT diam:     2.30 cm   LV E/e' lateral: 9.6 LV SV:         93 LV SV Index:   43 LVOT Area:     4.15 cm  RIGHT VENTRICLE RVSP:           23.8 mmHg LEFT ATRIUM             Index        RIGHT ATRIUM           Index LA diam:        5.50 cm 2.54 cm/m   RA Pressure: 3.00 mmHg LA Vol (A2C):   59.7 ml 27.57 ml/m  RA Area:     14.60 cm LA Vol  (A4C):   83.7 ml 38.66 ml/m  RA Volume:   28.80 ml  13.30 ml/m LA Biplane Vol: 73.0 ml 33.72 ml/m  AORTIC VALVE AV Area (Vmax):    1.43 cm AV Area (Vmean):   1.45 cm AV Area (VTI):     1.34 cm AV Vmax:           292.50 cm/s AV Vmean:          196.500 cm/s AV VTI:            0.692 m AV Peak Grad:      34.2 mmHg AV Mean Grad:      18.0 mmHg LVOT Vmax:         101.00 cm/s LVOT Vmean:        68.400 cm/s LVOT VTI:          0.224 m LVOT/AV VTI ratio: 0.32  AORTA Ao Root diam: 4.10 cm Ao Asc diam:  4.50 cm MITRAL VALVE                TRICUSPID VALVE MV Area (PHT): 3.40 cm     TR Peak grad:   20.8 mmHg MV Decel Time: 223 msec     TR Vmax:        228.00 cm/s MV E velocity: 94.30 cm/s   Estimated RAP:  3.00 mmHg MV A velocity: 103.00 cm/s  RVSP:           23.8 mmHg MV E/A ratio:  0.92                             SHUNTS                             Systemic VTI:  0.22 m                             Systemic Diam: 2.30 cm Oneil Parchment MD Electronically signed by Oneil Parchment MD Signature Date/Time: 03/29/2022/1:34:26 PM    Final     Assessment & Plan:   Problem  List Items Addressed This Visit     Arthritis of foot   Chronic pain Cont on Tramadol  prn  Potential benefits of a long term opioids use as well as potential risks (i.e. addiction risk, apnea etc) and complications (i.e. Somnolence, constipation and others) were explained to the patient and were aknowledged. 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    Dispense:  90 tablet    Refill:  3   amLODipine  (NORVASC ) 10 MG tablet    Sig: Take 1 tablet (10 mg total) by mouth daily.    Dispense:  90 tablet    Refill:  3   atorvastatin  (LIPITOR) 40 MG tablet    Sig: Take 1 tablet (40 mg total) by mouth daily.    Dispense:  90 tablet    Refill:  3   pioglitazone  (ACTOS ) 45 MG tablet    Sig: Take 1 tablet (45 mg total) by mouth daily.    Dispense:  90 tablet    Refill:  3      Follow-up: Return in about 3 months (around 11/18/2024) for a follow-up visit.  Marolyn Noel, MD

## 2024-08-18 NOTE — Assessment & Plan Note (Signed)
 Chronic pain Cont on Tramadol  prn  Potential benefits of a long term opioids use as well as potential risks (i.e. addiction risk, apnea etc) and complications (i.e. Somnolence, constipation and others) were explained to the patient and were aknowledged. Using a cane

## 2024-08-18 NOTE — Assessment & Plan Note (Addendum)
 4.5 cm by 2D echo 03/2022 BP control is good

## 2024-08-18 NOTE — Assessment & Plan Note (Signed)
On Lipitor and krill oil 

## 2024-08-22 ENCOUNTER — Ambulatory Visit: Payer: Self-pay | Admitting: Internal Medicine

## 2024-10-02 ENCOUNTER — Other Ambulatory Visit: Payer: Self-pay | Admitting: Internal Medicine

## 2024-10-18 ENCOUNTER — Telehealth: Payer: Self-pay

## 2024-10-18 ENCOUNTER — Other Ambulatory Visit: Payer: Self-pay | Admitting: Internal Medicine

## 2024-10-18 NOTE — Telephone Encounter (Signed)
 Patient walked in office requesting refill of his tramadol . Patient used profanity many times at front desk staff for not providing him with a script to get the medication.  Front desk member came to [employer] to discuss options. In review of his chart patient is prescribed the medication 50 mg tablet by mouth every 8 hours as needed for pain. Last Rx was 07/13/24 for 90 tablets, and 2 refills.  It seems a patient is taking as scheduled medication, not PRN.  When leader went to speak with patient, he had left the building.

## 2024-11-23 ENCOUNTER — Ambulatory Visit: Admitting: Internal Medicine

## 2024-11-29 ENCOUNTER — Ambulatory Visit: Admitting: Internal Medicine

## 2024-12-30 ENCOUNTER — Ambulatory Visit: Admitting: Internal Medicine

## 2025-02-22 ENCOUNTER — Ambulatory Visit
